# Patient Record
Sex: Female | Born: 1985 | Race: White | Hispanic: No | Marital: Single | Smoking: Current every day smoker
Health system: Southern US, Community
[De-identification: ages and names within clinical notes are randomized; demographics above are authoritative.]

## PROBLEM LIST (undated history)

## (undated) ENCOUNTER — Inpatient Hospital Stay (HOSPITAL_COMMUNITY): Payer: Self-pay

## (undated) DIAGNOSIS — F1110 Opioid abuse, uncomplicated: Secondary | ICD-10-CM

## (undated) DIAGNOSIS — K219 Gastro-esophageal reflux disease without esophagitis: Secondary | ICD-10-CM

## (undated) DIAGNOSIS — M545 Low back pain, unspecified: Secondary | ICD-10-CM

## (undated) DIAGNOSIS — R569 Unspecified convulsions: Secondary | ICD-10-CM

## (undated) DIAGNOSIS — F419 Anxiety disorder, unspecified: Secondary | ICD-10-CM

## (undated) DIAGNOSIS — F1920 Other psychoactive substance dependence, uncomplicated: Secondary | ICD-10-CM

## (undated) DIAGNOSIS — G8929 Other chronic pain: Secondary | ICD-10-CM

## (undated) DIAGNOSIS — N12 Tubulo-interstitial nephritis, not specified as acute or chronic: Secondary | ICD-10-CM

## (undated) DIAGNOSIS — Z8719 Personal history of other diseases of the digestive system: Secondary | ICD-10-CM

## (undated) DIAGNOSIS — M549 Dorsalgia, unspecified: Secondary | ICD-10-CM

## (undated) DIAGNOSIS — E039 Hypothyroidism, unspecified: Secondary | ICD-10-CM

## (undated) HISTORY — PX: MULTIPLE TOOTH EXTRACTIONS: SHX2053

## (undated) HISTORY — PX: URETERAL STENT PLACEMENT: SHX822

## (undated) HISTORY — PX: DILATION AND CURETTAGE OF UTERUS: SHX78

## (undated) HISTORY — PX: CHOLECYSTECTOMY: SHX55

---

## 2008-04-22 ENCOUNTER — Ambulatory Visit (HOSPITAL_COMMUNITY): Admission: RE | Admit: 2008-04-22 | Discharge: 2008-04-22 | Payer: Self-pay | Admitting: Psychiatry

## 2008-05-12 ENCOUNTER — Encounter: Payer: Self-pay | Admitting: Obstetrics and Gynecology

## 2008-05-12 ENCOUNTER — Ambulatory Visit: Payer: Self-pay | Admitting: Obstetrics & Gynecology

## 2008-06-02 ENCOUNTER — Ambulatory Visit: Payer: Self-pay | Admitting: Obstetrics & Gynecology

## 2008-06-30 ENCOUNTER — Ambulatory Visit: Payer: Self-pay | Admitting: Obstetrics & Gynecology

## 2008-07-07 ENCOUNTER — Ambulatory Visit (HOSPITAL_COMMUNITY): Admission: RE | Admit: 2008-07-07 | Discharge: 2008-07-07 | Payer: Self-pay | Admitting: Family Medicine

## 2008-07-28 ENCOUNTER — Ambulatory Visit: Payer: Self-pay | Admitting: Obstetrics & Gynecology

## 2008-07-28 ENCOUNTER — Encounter: Payer: Self-pay | Admitting: Family

## 2008-07-28 LAB — CONVERTED CEMR LAB
HCT: 34.2 % — ABNORMAL LOW (ref 36.0–46.0)
Hemoglobin: 11.2 g/dL — ABNORMAL LOW (ref 12.0–15.0)
MCHC: 32.7 g/dL (ref 30.0–36.0)
MCV: 87.9 fL (ref 78.0–100.0)
Platelets: 164 10*3/uL (ref 150–400)
RBC: 3.89 M/uL (ref 3.87–5.11)
RDW: 15.1 % (ref 11.5–15.5)
WBC: 11.2 10*3/uL — ABNORMAL HIGH (ref 4.0–10.5)

## 2008-08-31 ENCOUNTER — Inpatient Hospital Stay (HOSPITAL_COMMUNITY): Admission: AD | Admit: 2008-08-31 | Discharge: 2008-08-31 | Payer: Self-pay | Admitting: Family Medicine

## 2008-08-31 ENCOUNTER — Ambulatory Visit: Payer: Self-pay | Admitting: Family Medicine

## 2008-09-08 ENCOUNTER — Ambulatory Visit: Payer: Self-pay | Admitting: Obstetrics & Gynecology

## 2008-09-22 ENCOUNTER — Ambulatory Visit: Payer: Self-pay | Admitting: Obstetrics & Gynecology

## 2008-09-27 ENCOUNTER — Inpatient Hospital Stay (HOSPITAL_COMMUNITY): Admission: AD | Admit: 2008-09-27 | Discharge: 2008-09-27 | Payer: Self-pay | Admitting: Obstetrics & Gynecology

## 2008-09-29 ENCOUNTER — Ambulatory Visit: Payer: Self-pay | Admitting: Obstetrics & Gynecology

## 2008-09-29 ENCOUNTER — Ambulatory Visit (HOSPITAL_COMMUNITY): Admission: RE | Admit: 2008-09-29 | Discharge: 2008-09-29 | Payer: Self-pay | Admitting: Family Medicine

## 2008-10-08 ENCOUNTER — Ambulatory Visit: Payer: Self-pay | Admitting: Family Medicine

## 2008-10-08 ENCOUNTER — Inpatient Hospital Stay (HOSPITAL_COMMUNITY): Admission: AD | Admit: 2008-10-08 | Discharge: 2008-10-10 | Payer: Self-pay | Admitting: Family Medicine

## 2010-08-27 ENCOUNTER — Encounter: Payer: Self-pay | Admitting: *Deleted

## 2010-11-16 LAB — CBC
MCHC: 33.6 g/dL (ref 30.0–36.0)
Platelets: 253 10*3/uL (ref 150–400)
RDW: 16.5 % — ABNORMAL HIGH (ref 11.5–15.5)

## 2010-11-20 LAB — GC/CHLAMYDIA PROBE AMP, GENITAL
Chlamydia, DNA Probe: NEGATIVE
GC Probe Amp, Genital: NEGATIVE

## 2010-11-20 LAB — RAPID URINE DRUG SCREEN, HOSP PERFORMED
Amphetamines: NOT DETECTED
Barbiturates: NOT DETECTED
Benzodiazepines: NOT DETECTED

## 2010-11-20 LAB — URINALYSIS, ROUTINE W REFLEX MICROSCOPIC
Glucose, UA: NEGATIVE mg/dL
Ketones, ur: NEGATIVE mg/dL
Protein, ur: NEGATIVE mg/dL
Urobilinogen, UA: 0.2 mg/dL (ref 0.0–1.0)

## 2010-11-20 LAB — WET PREP, GENITAL: Yeast Wet Prep HPF POC: NONE SEEN

## 2010-11-20 LAB — STREP B DNA PROBE: Strep Group B Ag: NEGATIVE

## 2010-11-21 LAB — POCT URINALYSIS DIP (DEVICE)
Bilirubin Urine: NEGATIVE
Bilirubin Urine: NEGATIVE
Glucose, UA: NEGATIVE mg/dL
Glucose, UA: NEGATIVE mg/dL
Hgb urine dipstick: NEGATIVE
Hgb urine dipstick: NEGATIVE
Ketones, ur: NEGATIVE mg/dL
Ketones, ur: NEGATIVE mg/dL
Ketones, ur: NEGATIVE mg/dL
Protein, ur: NEGATIVE mg/dL
Specific Gravity, Urine: 1.015 (ref 1.005–1.030)
Specific Gravity, Urine: 1.015 (ref 1.005–1.030)
Specific Gravity, Urine: 1.015 (ref 1.005–1.030)
pH: 6 (ref 5.0–8.0)

## 2010-12-19 NOTE — Discharge Summary (Signed)
Tammy Winters, Tammy Winters               ACCOUNT NO.:  [id number]   MEDICAL RECORD NO.:  [id number]          PATIENT TYPE:  INP   LOCATION:  9112                          FACILITY:  [facility]   PHYSICIAN:  Tanya S. Shawnie Pons, M.D.   DATE OF BIRTH:  1986-07-21   DATE OF ADMISSION:  10/08/2008  DATE OF DISCHARGE:  10/10/2008                               DISCHARGE SUMMARY   ADMITTING DIAGNOSES:  1. Intrauterine pregnancy at 46 and 4/7 weeks.  2. Early active labor.  3. Fetal bradycardia.   PROCEDURE:  Vacuum-assisted vaginal delivery.   HOSPITAL COURSE:  [name] is a 25 year old gravida 2, para 1-0-0-1,  who presented at 68 and 4/7 weeks' in early active labor.  She was 4 cm,  80%, vertex -2.  She did an epidural when she got to labor and delivery.  Her prenatal care has been followed by [facility] with  onset of care at 17 weeks' gestation.  She uses methadone.  She quickly  labored to 9.5 cm and +2 station.  At that point, she was having some  early decelerations.  Upon pushing, she had some fetal bradycardia to  the 50s.  At that point, she was consented for a vacuum delivery by Dr.  Odie Sera that resulted in a birth of viable female infant, weight of  5 pounds 13 ounces, Apgars of 8 and 9.  He was taken to the full-term  nursery in good condition.  The patient tolerated her media postpartum  recovery well and was taken to the mother-baby unit.  By postpartum day  #1, she was doing well.  Her vital signs were stable.  It was discussed  with her that her methadone dose be reduced to 90 mg that she was on 105  mg at the end of pregnancy, but had been on 30 mg at the onset of  pregnancy.  The patient reluctantly agreed and she was bottle feeding.  By postpartum day #2, she continued to do well.  She had signed  bilateral tubal ligation consent papers.  During her stay, she also  expressed a desire for NuvaRing for more immediate contraception.  She  was deemed to have received  the full benefit of her hospital stay.  By  postpartum day #2, she was discharged home.   DISCHARGE MEDICATIONS:  1. Motrin 600 mg one p.o. q.6 h. p.r.n. pain.  2. NuvaRing to use as directed, to start it approximately 2 weeks, and      then methadone dosing will be per  [facility]  at [facility] in the      morning of October 11, 2008.       Cam Hai, C.N.M.      Shelbie Proctor. Shawnie Pons, M.D.  Electronically Signed    KS/MEDQ  D:  10/10/2008  T:  10/10/2008  Job:  [id number]

## 2010-12-19 NOTE — Op Note (Signed)
Tammy Winters, Tammy Winters               ACCOUNT NO.:  [id number]   MEDICAL RECORD NO.:  [id number]          PATIENT TYPE:  INP   LOCATION:  9160                          FACILITY:  [facility]   PHYSICIAN:  Tanya S. Shawnie Pons, M.D.   DATE OF BIRTH:  [birth year]   DATE OF PROCEDURE:  10/08/2008  DATE OF DISCHARGE:                               OPERATIVE REPORT   DELIVERY NOTE:  Tammy Winters' labor progressed normally and when  she was complete/complete and +2, she began her pushing efforts.  With  the first set of pushing efforts, the baby moved to a +3 station.  However, the baby suffered a fetal bradycardia down to the 50s.  This  did not improve with scalp stimulation and the patient's pushing efforts  then diminished due to pain and nausea.  The patient was counseled on  use of a vacuum.  Her bladder was not emptied because a Foley catheter  had been removed approximately 10 minutes before.  Anesthesia was  appropriate with an epidural.  The vacuum was placed in the appropriate  position on fetal head just anterior to the posterior fontanelle.  With  the assistance of some pushing efforts, the head was delivered easily on  the first pull with the vacuum.  There were no pop offs.  The head  presented LOA.  The mouth and nares were bulb suctioned.  A loose nuchal  cord was noted and reduced manually and the anterior shoulder followed  by the rest of the corpus were delivered in the usual manner without  difficulty.  The baby had a spontaneous cry, good color and tone.  The  mouth and nares were bulb suctioned again.  The cord was then clamped  and cut and baby was handed to the awaiting delivery room nurse.  The  placenta then delivered spontaneously and intact.  There was a three-  vessel cord.  A second-degree laceration was repaired in the usual  manner with 3-0 Vicryl.  A left periurethral tear was also repaired with  two simple interrupted sutures of the same suture material.  The lower  uterine segment was then checked and blood clots were removed.  The  uterus was noted to be firm.  Pitocin was running in the usual manner.  Estimated blood loss is 450 mL.  The patient tolerated procedure well  and there are no complications.      Odie Sera, DO  Electronically Signed     ______________________________  Shelbie Proctor. Shawnie Pons, M.D.    MC/MEDQ  D:  10/08/2008  T:  10/08/2008  Job:  [id number]

## 2011-05-08 LAB — POCT URINALYSIS DIP (DEVICE)
Bilirubin Urine: NEGATIVE
Glucose, UA: NEGATIVE
Glucose, UA: NEGATIVE
Hgb urine dipstick: NEGATIVE
Hgb urine dipstick: NEGATIVE
Ketones, ur: 15 — AB
Ketones, ur: NEGATIVE
Nitrite: NEGATIVE
Nitrite: NEGATIVE
Specific Gravity, Urine: 1.025
Specific Gravity, Urine: >=1.03
Specific Gravity, Urine: 1.01
pH: 5.5
pH: 7

## 2011-05-11 LAB — POCT URINALYSIS DIP (DEVICE)
Glucose, UA: NEGATIVE mg/dL
Hgb urine dipstick: NEGATIVE
Ketones, ur: NEGATIVE mg/dL
Protein, ur: 30 mg/dL — AB
Specific Gravity, Urine: 1.015 (ref 1.005–1.030)

## 2012-11-16 ENCOUNTER — Encounter (HOSPITAL_COMMUNITY): Payer: Self-pay | Admitting: Emergency Medicine

## 2012-11-16 ENCOUNTER — Inpatient Hospital Stay (HOSPITAL_COMMUNITY)

## 2012-11-16 ENCOUNTER — Emergency Department (HOSPITAL_COMMUNITY)

## 2012-11-16 ENCOUNTER — Inpatient Hospital Stay (HOSPITAL_COMMUNITY)
Admission: EM | Admit: 2012-11-16 | Discharge: 2012-11-18 | DRG: 896 | Disposition: A | Discharge status: Home / Self Care | Attending: Internal Medicine | Admitting: Internal Medicine

## 2012-11-16 DIAGNOSIS — F1193 Opioid use, unspecified with withdrawal: Secondary | ICD-10-CM | POA: Diagnosis present

## 2012-11-16 DIAGNOSIS — F19939 Other psychoactive substance use, unspecified with withdrawal, unspecified: Secondary | ICD-10-CM

## 2012-11-16 DIAGNOSIS — J9600 Acute respiratory failure, unspecified whether with hypoxia or hypercapnia: Secondary | ICD-10-CM

## 2012-11-16 DIAGNOSIS — F411 Generalized anxiety disorder: Secondary | ICD-10-CM | POA: Diagnosis present

## 2012-11-16 DIAGNOSIS — J9601 Acute respiratory failure with hypoxia: Secondary | ICD-10-CM | POA: Diagnosis present

## 2012-11-16 DIAGNOSIS — Z79899 Other long term (current) drug therapy: Secondary | ICD-10-CM

## 2012-11-16 DIAGNOSIS — R569 Unspecified convulsions: Secondary | ICD-10-CM | POA: Diagnosis present

## 2012-11-16 DIAGNOSIS — F1120 Opioid dependence, uncomplicated: Secondary | ICD-10-CM | POA: Diagnosis present

## 2012-11-16 DIAGNOSIS — G40401 Other generalized epilepsy and epileptic syndromes, not intractable, with status epilepticus: Secondary | ICD-10-CM

## 2012-11-16 DIAGNOSIS — L299 Pruritus, unspecified: Secondary | ICD-10-CM | POA: Diagnosis not present

## 2012-11-16 DIAGNOSIS — F1910 Other psychoactive substance abuse, uncomplicated: Secondary | ICD-10-CM | POA: Diagnosis present

## 2012-11-16 DIAGNOSIS — A498 Other bacterial infections of unspecified site: Secondary | ICD-10-CM | POA: Diagnosis present

## 2012-11-16 DIAGNOSIS — N390 Urinary tract infection, site not specified: Secondary | ICD-10-CM | POA: Diagnosis present

## 2012-11-16 DIAGNOSIS — G40901 Epilepsy, unspecified, not intractable, with status epilepticus: Secondary | ICD-10-CM

## 2012-11-16 DIAGNOSIS — T370X5A Adverse effect of sulfonamides, initial encounter: Secondary | ICD-10-CM | POA: Diagnosis not present

## 2012-11-16 DIAGNOSIS — E873 Alkalosis: Secondary | ICD-10-CM | POA: Diagnosis present

## 2012-11-16 DIAGNOSIS — F1123 Opioid dependence with withdrawal: Secondary | ICD-10-CM | POA: Diagnosis present

## 2012-11-16 HISTORY — DX: Opioid abuse, uncomplicated: F11.10

## 2012-11-16 HISTORY — DX: Anxiety disorder, unspecified: F41.9

## 2012-11-16 HISTORY — DX: Other psychoactive substance dependence, uncomplicated: F19.20

## 2012-11-16 LAB — RAPID URINE DRUG SCREEN, HOSP PERFORMED
Amphetamines: NOT DETECTED
Barbiturates: NOT DETECTED
Benzodiazepines: POSITIVE — AB
Cocaine: NOT DETECTED
Tetrahydrocannabinol: POSITIVE — AB

## 2012-11-16 LAB — POCT I-STAT, CHEM 8
Calcium, Ion: 1.17 mmol/L (ref 1.12–1.23)
HCT: 41 % (ref 36.0–46.0)
TCO2: 25 mmol/L (ref 0–100)

## 2012-11-16 LAB — COMPREHENSIVE METABOLIC PANEL
ALT: 49 U/L — ABNORMAL HIGH (ref 0–35)
Albumin: 3.7 g/dL (ref 3.5–5.2)
Alkaline Phosphatase: 63 U/L (ref 39–117)
BUN: 10 mg/dL (ref 6–23)
Potassium: 3.3 mEq/L — ABNORMAL LOW (ref 3.5–5.1)
Sodium: 142 mEq/L (ref 135–145)
Total Protein: 7 g/dL (ref 6.0–8.3)

## 2012-11-16 LAB — CBC
HCT: 39 % (ref 36.0–46.0)
MCV: 87.8 fL (ref 78.0–100.0)
RBC: 4.44 MIL/uL (ref 3.87–5.11)
WBC: 12.5 10*3/uL — ABNORMAL HIGH (ref 4.0–10.5)

## 2012-11-16 LAB — POCT I-STAT 3, ART BLOOD GAS (G3+)
TCO2: 30 mmol/L (ref 0–100)
pCO2 arterial: 35.1 mmHg (ref 35.0–45.0)
pH, Arterial: 7.517 — ABNORMAL HIGH (ref 7.350–7.450)

## 2012-11-16 LAB — POCT PREGNANCY, URINE: Preg Test, Ur: NEGATIVE

## 2012-11-16 LAB — SALICYLATE LEVEL: Salicylate Lvl: <2 mg/dL — ABNORMAL LOW (ref 2.8–20.0)

## 2012-11-16 LAB — MRSA PCR SCREENING: MRSA by PCR: NEGATIVE

## 2012-11-16 LAB — URINALYSIS, ROUTINE W REFLEX MICROSCOPIC
Bilirubin Urine: NEGATIVE
Hgb urine dipstick: NEGATIVE
Specific Gravity, Urine: 1.025 (ref 1.005–1.030)
pH: 6 (ref 5.0–8.0)

## 2012-11-16 LAB — ETHANOL: Alcohol, Ethyl (B): <11 mg/dL (ref 0–11)

## 2012-11-16 LAB — URINE MICROSCOPIC-ADD ON

## 2012-11-16 MED ORDER — LORAZEPAM 2 MG/ML IJ SOLN
INTRAMUSCULAR | Status: AC
  Administered 2012-11-16: 2 mg
  Filled 2012-11-16: qty 1

## 2012-11-16 MED ORDER — HEPARIN SODIUM (PORCINE) 5000 UNIT/ML IJ SOLN
5000.0000 [IU] | Freq: Three times a day (TID) | INTRAMUSCULAR | Status: DC
  Administered 2012-11-16 – 2012-11-17 (×4): 5000 [IU] via SUBCUTANEOUS
  Filled 2012-11-16 (×10): qty 1

## 2012-11-16 MED ORDER — DEXTROSE 5 % IV SOLN
1.0000 g | Freq: Once | INTRAVENOUS | Status: DC
  Administered 2012-11-16: 1 g via INTRAVENOUS
  Filled 2012-11-16: qty 10

## 2012-11-16 MED ORDER — CHLORHEXIDINE GLUCONATE 0.12 % MT SOLN
15.0000 mL | Freq: Two times a day (BID) | OROMUCOSAL | Status: DC
  Administered 2012-11-16: 15 mL via OROMUCOSAL
  Filled 2012-11-16: qty 15

## 2012-11-16 MED ORDER — SODIUM CHLORIDE 0.9 % IV SOLN: 250.0000 mL | INTRAVENOUS | Status: DC | PRN

## 2012-11-16 MED ORDER — LORAZEPAM 2 MG/ML IJ SOLN
INTRAMUSCULAR | Status: AC
  Filled 2012-11-16: qty 1

## 2012-11-16 MED ORDER — ROCURONIUM BROMIDE 50 MG/5ML IV SOLN
INTRAVENOUS | Status: AC
  Filled 2012-11-16: qty 2

## 2012-11-16 MED ORDER — ONDANSETRON HCL 4 MG/2ML IJ SOLN
INTRAMUSCULAR | Status: AC
  Administered 2012-11-16: 4 mg
  Filled 2012-11-16: qty 2

## 2012-11-16 MED ORDER — SUCCINYLCHOLINE CHLORIDE 20 MG/ML IJ SOLN
INTRAMUSCULAR | Status: AC
  Filled 2012-11-16: qty 1

## 2012-11-16 MED ORDER — SODIUM CHLORIDE 0.9 % IV BOLUS (SEPSIS)
1000.0000 mL | Freq: Once | INTRAVENOUS | Status: AC
  Administered 2012-11-16: 1000 mL via INTRAVENOUS

## 2012-11-16 MED ORDER — SODIUM CHLORIDE 0.9 % IV BOLUS (SEPSIS)
500.0000 mL | Freq: Once | INTRAVENOUS | Status: AC
  Administered 2012-11-16: 500 mL via INTRAVENOUS

## 2012-11-16 MED ORDER — PROPOFOL 10 MG/ML IV EMUL
INTRAVENOUS | Status: AC
  Filled 2012-11-16: qty 100

## 2012-11-16 MED ORDER — SODIUM CHLORIDE 0.9 % IV SOLN
500.0000 mg | Freq: Two times a day (BID) | INTRAVENOUS | Status: DC
  Filled 2012-11-16 (×2): qty 5

## 2012-11-16 MED ORDER — ETOMIDATE 2 MG/ML IV SOLN
INTRAVENOUS | Status: AC
  Filled 2012-11-16: qty 20

## 2012-11-16 MED ORDER — LIDOCAINE HCL (CARDIAC) 20 MG/ML IV SOLN
INTRAVENOUS | Status: AC
  Filled 2012-11-16: qty 5

## 2012-11-16 MED ORDER — DEXTROSE 5 % IV SOLN
1.0000 g | INTRAVENOUS | Status: DC
  Administered 2012-11-17 – 2012-11-18 (×2): 1 g via INTRAVENOUS
  Filled 2012-11-16 (×3): qty 10

## 2012-11-16 MED ORDER — LIDOCAINE HCL (CARDIAC) 20 MG/ML IV SOLN
INTRAVENOUS | Status: DC | PRN
  Administered 2012-11-16: 100 mg via INTRAVENOUS

## 2012-11-16 MED ORDER — SODIUM CHLORIDE 0.9 % IV SOLN
INTRAVENOUS | Status: DC
  Administered 2012-11-16 (×3): via INTRAVENOUS

## 2012-11-16 MED ORDER — FENTANYL CITRATE 0.05 MG/ML IJ SOLN
50.0000 ug | INTRAMUSCULAR | Status: DC | PRN
  Filled 2012-11-16: qty 2

## 2012-11-16 MED ORDER — PROPOFOL 10 MG/ML IV EMUL: 5.0000 ug/kg/min | INTRAVENOUS | Status: DC

## 2012-11-16 MED ORDER — LORAZEPAM 2 MG/ML IJ SOLN
1.0000 mg | Freq: Once | INTRAMUSCULAR | Status: AC
  Administered 2012-11-16: 1 mg via INTRAVENOUS

## 2012-11-16 MED ORDER — SODIUM CHLORIDE 0.9 % IV SOLN
1000.0000 mg | Freq: Once | INTRAVENOUS | Status: AC
  Administered 2012-11-16: 1000 mg via INTRAVENOUS
  Filled 2012-11-16: qty 10

## 2012-11-16 MED ORDER — WHITE PETROLATUM GEL
Status: AC
  Administered 2012-11-16: 0.2
  Filled 2012-11-16: qty 5

## 2012-11-16 MED ORDER — METHADONE HCL 10 MG PO TABS
20.0000 mg | ORAL_TABLET | Freq: Two times a day (BID) | ORAL | Status: DC
  Administered 2012-11-16 – 2012-11-18 (×5): 20 mg via ORAL
  Filled 2012-11-16 (×4): qty 2
  Filled 2012-11-16: qty 1

## 2012-11-16 MED ORDER — ETOMIDATE 2 MG/ML IV SOLN
INTRAVENOUS | Status: DC | PRN
  Administered 2012-11-16: 20 mg via INTRAVENOUS

## 2012-11-16 MED ORDER — FENTANYL CITRATE 0.05 MG/ML IJ SOLN
50.0000 ug | Freq: Once | INTRAMUSCULAR | Status: AC
  Administered 2012-11-16: 50 ug via INTRAVENOUS

## 2012-11-16 MED ORDER — PANTOPRAZOLE SODIUM 40 MG IV SOLR: 40.0000 mg | INTRAVENOUS | Status: DC

## 2012-11-16 MED ORDER — SUCCINYLCHOLINE CHLORIDE 20 MG/ML IJ SOLN
INTRAMUSCULAR | Status: DC | PRN
  Administered 2012-11-16: 100 mg via INTRAVENOUS

## 2012-11-16 MED ORDER — SODIUM CHLORIDE 0.9 % IV SOLN
500.0000 mg | Freq: Two times a day (BID) | INTRAVENOUS | Status: DC
  Administered 2012-11-16 (×2): 500 mg via INTRAVENOUS
  Filled 2012-11-16 (×3): qty 5

## 2012-11-16 MED ORDER — THIAMINE HCL 100 MG/ML IJ SOLN
Freq: Once | INTRAVENOUS | Status: DC
  Filled 2012-11-16: qty 1000

## 2012-11-16 MED ORDER — FENTANYL BOLUS VIA INFUSION
25.0000 ug | Freq: Four times a day (QID) | INTRAVENOUS | Status: DC | PRN
  Filled 2012-11-16: qty 100

## 2012-11-16 MED ORDER — POTASSIUM CHLORIDE CRYS ER 20 MEQ PO TBCR
40.0000 meq | EXTENDED_RELEASE_TABLET | Freq: Once | ORAL | Status: AC
  Administered 2012-11-16: 40 meq via ORAL
  Filled 2012-11-16: qty 2

## 2012-11-16 MED ORDER — ONDANSETRON HCL 4 MG/2ML IJ SOLN: 4.0000 mg | Freq: Three times a day (TID) | INTRAMUSCULAR | Status: DC | PRN

## 2012-11-16 MED ORDER — SODIUM CHLORIDE 0.9 % IV SOLN
25.0000 ug/h | INTRAVENOUS | Status: DC
  Administered 2012-11-16: 50 ug/h via INTRAVENOUS
  Filled 2012-11-16: qty 50

## 2012-11-16 MED ORDER — BIOTENE DRY MOUTH MT LIQD: 15.0000 mL | Freq: Four times a day (QID) | OROMUCOSAL | Status: DC

## 2012-11-16 MED ORDER — ALBUTEROL SULFATE HFA 108 (90 BASE) MCG/ACT IN AERS
4.0000 | INHALATION_SPRAY | RESPIRATORY_TRACT | Status: DC | PRN
  Filled 2012-11-16: qty 6.7

## 2012-11-16 MED ORDER — CHLORHEXIDINE GLUCONATE 0.12 % MT SOLN
OROMUCOSAL | Status: AC
  Administered 2012-11-16: 15 mL
  Filled 2012-11-16: qty 15

## 2012-11-16 NOTE — ED Notes (Signed)
Pt restrained by handcuffs.

## 2012-11-16 NOTE — ED Notes (Signed)
Pt is also complaining of leg pain and stomach pain.

## 2012-11-16 NOTE — ED Provider Notes (Signed)
History     CSN: [id number]  Arrival date & time 11/16/12  0011   First MD Initiated Contact with Patient 11/16/12 [phone]      Chief Complaint  Patient presents with  . Seizures    (Consider location/radiation/quality/duration/timing/severity/associated sxs/prior treatment) HPI History provided by patient, EMS and law enforcement bedside. Patient has been going to the methadone clinic for the last 4 years, and his last week has not had any methadone after being arrested. She is currently in custody and had multiple witnessed seizures while in jail tonight. EMS provided Versed and since that time has not had any more seizure activity. Patient denies history of same. She is somewhat post ictal at time of my evaluation but denies any pain or injury at this time. No  tongue laceration. No incontinence. Symptoms moderate in severity.   History reviewed. No pertinent past medical history.  History reviewed. No pertinent past surgical history.  History reviewed. No pertinent family history.  History  Substance Use Topics  . Smoking status: Not on file  . Smokeless tobacco: Not on file  . Alcohol Use: Not on file    OB History   Grav Para Term Preterm Abortions TAB SAB Ect Mult Living                  Review of Systems  Constitutional: Negative for fever and chills.  HENT: Negative for neck pain and neck stiffness.   Eyes: Negative for pain.  Respiratory: Negative for shortness of breath.   Cardiovascular: Negative for chest pain.  Gastrointestinal: Negative for vomiting and abdominal pain.  Genitourinary: Negative for dysuria.  Musculoskeletal: Negative for back pain.  Skin: Negative for rash.  Neurological: Positive for seizures.  All other systems reviewed and are negative.    Allergies  Review of patient's allergies indicates not on file.  Home Medications  No current outpatient prescriptions on file.  BP 102/87  Pulse 90  Temp(Src) 97.7 F (36.5 C) (Oral)   Resp 15  SpO2 98%  Physical Exam  Constitutional: She appears well-developed and well-nourished.  HENT:  Head: Normocephalic and atraumatic.  No tongue laceration. No dental tenderness  Eyes: EOM are normal. Pupils are equal, round, and reactive to light.  Neck: Neck supple.  No midline cervical tenderness or deformity  Cardiovascular: Normal rate, regular rhythm and intact distal pulses.   Pulmonary/Chest: Effort normal and breath sounds normal. No respiratory distress. She exhibits no tenderness.  Abdominal: Soft. Bowel sounds are normal. She exhibits no distension.  Musculoskeletal: Normal range of motion. She exhibits no edema and no tenderness.  Moves all extremities x4 without any point tenderness or deficits. Mild abrasions right ankle  Neurological: She is alert.  Eyes closed with speech slow, no unilateral deficits, post ictal  Skin: Skin is warm and dry.    ED Course  INTUBATION Date/Time: 11/16/2012 2:19 AM Performed by: Sunnie Nielsen Authorized by: Sunnie Nielsen Consent: The procedure was performed in an emergent situation. Required items: required blood products, implants, devices, and special equipment available Patient identity confirmed: arm band Time out: Immediately prior to procedure a "time out" was called to verify the correct patient, procedure, equipment, support staff and site/side marked as required. Indications: airway protection Intubation method: direct Pretreatment medications: lidocaine Sedatives: etomidate Paralytic: succinylcholine Laryngoscope size: Mac 4 Tube size: 7.5 mm Tube type: cuffed Number of attempts: 1 Cricoid pressure: yes Cords visualized: yes Post-procedure assessment: chest rise and CO2 detector Breath sounds: equal and absent over the  epigastrium Cuff inflated: yes ETT to lip: 24 cm Tube secured with: ETT holder Chest x-ray interpreted by me.   (including critical care time)   Results for orders placed during the hospital  encounter of 11/16/12  CBC      Result Value Range   WBC 12.5 (*) 4.0 - 10.5 K/uL   RBC 4.44  3.87 - 5.11 MIL/uL   Hemoglobin 13.5  12.0 - 15.0 g/dL   HCT 09.8  11.9 - 14.7 %   MCV 87.8  78.0 - 100.0 fL   MCH 30.4  26.0 - 34.0 pg   MCHC 34.6  30.0 - 36.0 g/dL   RDW 82.9  56.2 - 13.0 %   Platelets 248  150 - 400 K/uL  URINALYSIS, ROUTINE W REFLEX MICROSCOPIC      Result Value Range   Color, Urine YELLOW  YELLOW   APPearance CLOUDY (*) CLEAR   Specific Gravity, Urine 1.025  1.005 - 1.030   pH 6.0  5.0 - 8.0   Glucose, UA NEGATIVE  NEGATIVE mg/dL   Hgb urine dipstick NEGATIVE  NEGATIVE   Bilirubin Urine NEGATIVE  NEGATIVE   Ketones, ur NEGATIVE  NEGATIVE mg/dL   Protein, ur NEGATIVE  NEGATIVE mg/dL   Urobilinogen, UA 0.2  0.0 - 1.0 mg/dL   Nitrite POSITIVE (*) NEGATIVE   Leukocytes, UA SMALL (*) NEGATIVE  URINE RAPID DRUG SCREEN (HOSP PERFORMED)      Result Value Range   Opiates NONE DETECTED  NONE DETECTED   Cocaine NONE DETECTED  NONE DETECTED   Benzodiazepines POSITIVE (*) NONE DETECTED   Amphetamines NONE DETECTED  NONE DETECTED   Tetrahydrocannabinol POSITIVE (*) NONE DETECTED   Barbiturates NONE DETECTED  NONE DETECTED  URINE MICROSCOPIC-ADD ON      Result Value Range   Squamous Epithelial / LPF RARE  RARE   WBC, UA 3-6  <3 WBC/hpf   RBC / HPF 0-2  <3 RBC/hpf   Bacteria, UA MANY (*) RARE   Crystals CA OXALATE CRYSTALS (*) NEGATIVE  POCT I-STAT, CHEM 8      Result Value Range   Sodium 143  135 - 145 mEq/L   Potassium 3.4 (*) 3.5 - 5.1 mEq/L   Chloride 105  96 - 112 mEq/L   BUN 9  6 - 23 mg/dL   Creatinine, Ser 8.65  0.50 - 1.10 mg/dL   Glucose, Bld 784 (*) 70 - 99 mg/dL   Calcium, Ion 6.96  2.95 - 1.23 mmol/L   TCO2 25  0 - 100 mmol/L   Hemoglobin 13.9  12.0 - 15.0 g/dL   HCT 28.4  13.2 - 44.0 %  POCT PREGNANCY, URINE      Result Value Range   Preg Test, Ur NEGATIVE  NEGATIVE   CRITICAL CARE Performed by: Sunnie Nielsen   Total critical care time:  35  Critical care time was exclusive of separately billable procedures and treating other patients.  Critical care was necessary to treat or prevent imminent or life-threatening deterioration.  Critical care was time spent personally by me on the following activities: development of treatment plan with patient and/or surrogate as well as nursing, discussions with consultants, evaluation of patient's response to treatment, examination of patient, obtaining history from patient or surrogate, ordering and performing treatments and interventions, ordering and review of laboratory studies, ordering and review of radiographic studies, pulse oximetry and re-evaluation of patient's condition.    Date: 11/16/2012  Rate: 95  Rhythm: normal sinus rhythm  QRS Axis: normal  Intervals: PR shortened  ST/T Wave abnormalities: nonspecific ST/T changes  Conduction Disutrbances:none  Narrative Interpretation:   Old EKG Reviewed: none available  1:25 AM IV ativan for recurrent seizures in the ED. Consult NEU - Dr Tomma Rakers Keppra now and if needs intubation IV Propofol.   2:03 AM - persistent Sz activity. Set up for intubation and plan ICU admit on propofol. IV ABx for possible UTI  2:20 AM discussed with pulmonary critical care on call and plan admit to ICU. CXR and CT pending  2:41 AM discussed again with the neurologist, will follow.   MDM  Status epilepticus Multiple witnessed seizures Seizure in the ED after versed and Ativan. IV Keppra Intubated for airway protection and propofol drip IV antibiotics for possible UTI culture pending UDS shows THC and benzos, EKG and labs reviewed Imaging obtained ICU admit     Sunnie Nielsen, MD 11/16/12 [phone]

## 2012-11-16 NOTE — ED Notes (Signed)
Pt seizing, per MD opitz, give 1mg  ativan

## 2012-11-16 NOTE — Progress Notes (Signed)
Dr Vassie Loll notified of pt dry heaves and nausea, order was given for zofran IVP. Dr Vassie Loll also made aware pt stated she was "swimmy headed feeling like I felt before my seizures in jail yesterday" Dr Vassie Loll informed no PRN's ordered incase of seizure. He stated to make CCM aware if pt began to seizure.  Pt then began at approx 2:45pm having extremity twitching and she stated reglan does the same and does not want anymore zofran. Allergies updated in chart and zofran discontinued.  Will continue to monitor.

## 2012-11-16 NOTE — ED Notes (Signed)
Dr. Dierdre Highman at bedside after first seizure; Dr. Dierdre Highman makes decision to intubate pt. B/c of new onset. Prior to intubation, pt. Had 3 seizure activity events (lasting 6-10 seconds), and were similar to the other seizures during ED visit.

## 2012-11-16 NOTE — ED Notes (Signed)
Patient transported to CT.  Pt accompanied by respiratory therapist, nurse,nurse tech, x2 sheriffs.

## 2012-11-16 NOTE — ED Notes (Signed)
Per EMS, Pt has been going to methadone clinic, but has not been going the past week due to pt being arrested. Pt has law enforcement with her because pt was arrested for failure to appear in court. Pt started seizing tonight and does not have a hx of seizures. Pt received midazolam 5mg  via ems.

## 2012-11-16 NOTE — Progress Notes (Signed)
Subjective: Patient without further seizures since admission.  On Keppra.  Awake and alert.  No side effects noted to Keppra.    Objective: Current vital signs: BP 108/60  Pulse 85  Temp(Src) 97.9 F (36.6 C) (Core (Comment))  Resp 17  Ht 5\' 2"  (1.575 m)  Wt 66.1 kg (145 lb 11.6 oz)  BMI 26.65 kg/m2  SpO2 100%  LMP 11/16/2012 Vital signs in last 24 hours: Temp:  [95.9 F (35.5 C)-97.9 F (36.6 C)] 97.9 F (36.6 C) (04/13 0715) Pulse Rate:  [56-124] 85 (04/13 0715) Resp:  [11-31] 17 (04/13 0715) BP: (80-139)/(53-124) 108/60 mmHg (04/13 0715) SpO2:  [98 %-100 %] 100 % (04/13 0700) FiO2 (%):  [30.1 %-100 %] 30.1 % (04/13 0729) Weight:  [66.1 kg (145 lb 11.6 oz)-68.04 kg (150 lb)] 66.1 kg (145 lb 11.6 oz) (04/13 0530)  Intake/Output from previous day: 04/12 0701 - 04/13 0700 In: 1204.9 [I.V.:704.9; IV Piggyback:500] Out: 250 [Urine:250] Intake/Output this shift:   Nutritional status: NPO  Neurologic Exam: Mental Status:  Intubated. Awake and alert. Cranial Nerves:  Pupils 4 mm bilaterally, reactive to light. No gaze preference. EOM full without nystagmus. Face is symmetric.   Motor:  Moves all extremities against gravity reasonably symmetrically Sensory: Sensory intact throughout Deep Tendon Reflexes: 2+ and symmetric throughout  Plantars:  Right: downgoing      Left: downgoing  Cerebellar:  Unable to test due to restraints  Lab Results: Basic Metabolic Panel:  Recent Labs Lab 11/16/12 0109 11/16/12 0244  NA 143 142  K 3.4* 3.3*  CL 105 104  CO2  --  26  GLUCOSE 108* 96  BUN 9 10  CREATININE 0.70 0.67  CALCIUM  --  9.6    Liver Function Tests:  Recent Labs Lab 11/16/12 0244  AST 38*  ALT 49*  ALKPHOS 63  BILITOT 0.2*  PROT 7.0  ALBUMIN 3.7   No results found for this basename: LIPASE, AMYLASE,  in the last 168 hours No results found for this basename: AMMONIA,  in the last 168 hours  CBC:  Recent Labs Lab 11/16/12 0052 11/16/12 0109   WBC 12.5*  --   HGB 13.5 13.9  HCT 39.0 41.0  MCV 87.8  --   PLT 248  --     Cardiac Enzymes: No results found for this basename: CKTOTAL, CKMB, CKMBINDEX, TROPONINI,  in the last 168 hours  Lipid Panel: No results found for this basename: CHOL, TRIG, HDL, CHOLHDL, VLDL, LDLCALC,  in the last 168 hours  CBG: No results found for this basename: GLUCAP,  in the last 168 hours  Microbiology: Results for orders placed during the hospital encounter of 11/16/12  MRSA PCR SCREENING     Status: None   Collection Time    11/16/12  5:50 AM      Result Value Range Status   MRSA by PCR NEGATIVE  NEGATIVE Final   Comment:            The GeneXpert MRSA Assay (FDA     approved for NASAL specimens     only), is one component of a     comprehensive MRSA colonization     surveillance program. It is not     intended to diagnose MRSA     infection nor to guide or     monitor treatment for     MRSA infections.    Coagulation Studies: No results found for this basename: LABPROT, INR,  in the last 72  hours  Imaging: Ct Head Wo Contrast  11/16/2012  *RADIOLOGY REPORT*  Clinical Data:  Seizure.  CT HEAD WITHOUT CONTRAST  Technique: Contiguous axial images were obtained from the base of the skull through the vertex without contrast  Comparison: None  Findings:  There is no evidence of intracranial hemorrhage, brain edema, or other signs of acute infarction.  There is no evidence of intracranial mass lesion or mass effect.  No abnormal extraaxial fluid collections are identified.  There is no evidence of hydrocephalus, or other significant intracranial abnormality.  No skull abnormality identified.  Diffuse mucosal thickening is seen involving the sphenoid sinus.  IMPRESSION: 1.  Negative non-contrast head CT. 2.  Chronic sphenoid sinusitis.   Original Report Authenticated By: Myles Rosenthal, M.D.    Dg Chest Port 1 View  11/16/2012  *RADIOLOGY REPORT*  Clinical Data: Seizure.  Nasogastric tube  placement.  Respiratory failure.  PORTABLE CHEST - 1 VIEW  Comparison: 11/16/2012  Findings: A new nasogastric is seen entering the stomach. Endotracheal tube tip is now near the carina.  Both lungs are clear.  Heart size is normal.  IMPRESSION:  1.  New nasogastric tube enters the stomach.  Low endotracheal tube position with tip near the carina. 2.  No active lung disease.   Original Report Authenticated By: Myles Rosenthal, M.D.    Dg Chest Portable 1 View  11/16/2012  *RADIOLOGY REPORT*  Clinical Data: Seizure.  Respiratory failure.  PORTABLE CHEST - 1 VIEW  Comparison: None.  Findings: Endotracheal tube is seen in the trachea with the tip approximately 3.5 cm above the carina.  Both lungs are clear.  No evidence of pneumothorax or pleural effusion.  Heart size and mediastinal contours are normal.  IMPRESSION: Endotracheal tube in appropriate position.  No active lung disease.   Original Report Authenticated By: Myles Rosenthal, M.D.     Medications:  I have reviewed the patient's current medications. Scheduled: . antiseptic oral rinse  15 mL Mouth Rinse QID  . [START ON 11/17/2012] cefTRIAXone (ROCEPHIN)  IV  1 g Intravenous Q24H  . chlorhexidine  15 mL Mouth Rinse BID  . etomidate      . etomidate      . heparin  5,000 Units Subcutaneous Q8H  . levETIRAcetam  500 mg Intravenous Q12H  . levETIRAcetam  500 mg Intravenous Q12H  . lidocaine (cardiac) 100 mg/104ml      . lidocaine (cardiac) 100 mg/31ml      . pantoprazole (PROTONIX) IV  40 mg Intravenous Q24H  . propofol      . propofol      . rocuronium      . rocuronium      . banana bag IV 1000 mL ** MVI currently unavailable **   Intravenous Once  . succinylcholine      . succinylcholine        Assessment/Plan: 27 year old female presenting with seizure activity.  Now on Keppra.  No further seizures noetd since admission.  Awake and alert today.  No side effects noted to Keppra.  Head CT reviewed and unremarkable.  Unclear at this time whether  seizures related to Methadone discontinuation but currently patient without further clinical evidence of withdrawal  Recommendations: 1.  Would continue Keppra for now and change to po once patient able to take po 2.  EEG pending 3.  Would not restart Methadone at this time.      LOS: 0 days   Thana Farr, MD [facility] [phone]  11/16/2012  7:44 AM

## 2012-11-16 NOTE — Progress Notes (Signed)
At 2:55pm pt began full body seizures with arms contracted in and mild back arching with tachycardia in 100-130's lasting approx 5-10sec with 4episodes. Pt then began moaning stating her head hurt and she felt like she could not breath. O2 sats 100% RA, [place] placed on pt for comfort, no distress noted. Pt did not vomit or have incont episode.  Elink and Dr Thad Ranger notified. Orders for ativan given and NPO. Will continue to monitor.

## 2012-11-16 NOTE — Progress Notes (Signed)
SUP ordered  

## 2012-11-16 NOTE — H&P (Signed)
PULMONARY  / CRITICAL CARE MEDICINE H&P  Name: Tammy Winters MRN: [id number] DOB: Dec 24, 1985    ADMISSION DATE:  11/16/2012   CHIEF COMPLAINT:  Seizure  BRIEF PATIENT DESCRIPTION: 27 yo with h/o narcotic abuse presenting to the ED with multiple seizure like activity.  SIGNIFICANT EVENTS / STUDIES:  11/16/12 Intubated, admitted, neuro consulted  LINES / TUBES: ETT 4/13 >>>  CULTURES: Urine cx 4/13 >>>  ANTIBIOTICS: Ceftriaxone 1Q24 4/13 >>>  HISTORY OF PRESENT ILLNESS:  27 yo F with history of opiate of opiate abuse and no other medical problems presents to the ED with seizures.  She was previously on chronic methadone for history of opiate abuse. She was arrested 1 weeks ago.  Tonight she developed seizure activity and was brought to the ED.  On route she was given versed.  In the ED she continued to have episodes of shaking.  Per ED staff reports the episodes were shaking of the bilateral extremities with immediate return to normal mental status.   No loss of continence, tongue biting, eye deviation or preceding symptoms.  She was able to alert staff that she felt the episodes coming on prior to the events. She was after multiple episodes occurred and head CT was felt to be urgent.  PAST MEDICAL HISTORY :  History reviewed. No pertinent past medical history. History reviewed. No pertinent past surgical history. Prior to Admission medications   Not on File   Not on File  FAMILY HISTORY:  History reviewed. No pertinent family history. SOCIAL HISTORY:  has no tobacco, alcohol, and drug history on file.  REVIEW OF SYSTEMS:  Unable to obtain as patient is sedated and intubated.  SUBJECTIVE:   VITAL SIGNS: Temp:  [97.7 F (36.5 C)] 97.7 F (36.5 C) (04/13 0011) Pulse Rate:  [81-90] 81 (04/13 0226) Resp:  [15-31] 16 (04/13 0226) BP: (92-139)/(55-124) 116/76 mmHg (04/13 0226) SpO2:  [98 %-100 %] 100 % (04/13 0226) FiO2 (%):  [100 %] 100 % (04/13 0210) HEMODYNAMICS:   VENTILATOR  SETTINGS: Vent Mode:  [-] PRVC FiO2 (%):  [100 %] 100 % Set Rate:  [16 bmp] 16 bmp Vt Set:  [450 mL] 450 mL PEEP:  [5 cmH20] 5 cmH20 Plateau Pressure:  [12 cmH20] 12 cmH20 INTAKE / OUTPUT: Intake/Output   None     PHYSICAL EXAMINATION: General:  Lying in bed intubated Neuro:  Sedated HEENT: PERRL, ETT in place Cardiovascular:  RRR Lungs:  CTAB Abdomen:  Soft, NT, ND Musculoskeletal:  Joints wnl Skin:  No rash  LABS:  Recent Labs Lab 11/16/12 0052 11/16/12 0109  HGB 13.5 13.9  WBC 12.5*  --   PLT 248  --   NA  --  143  K  --  3.4*  CL  --  105  GLUCOSE  --  108*  BUN  --  9  CREATININE  --  0.70   No results found for this basename: GLUCAP,  in the last 168 hours  CXR: ETT well placed, no lung disease  CT Head 1. Negative non-contrast head CT.  2. Chronic sphenoid sinusitis.   ASSESSMENT / PLAN:  27 yo F with history of opiate abuse opiate abuse and possible seizures.  Seizure activity - Atypical in appearance per ED staff - CT head wnl  - Neurology to evaluate - Ordered EEG - Continue Keppra until neuro evals - Will get CMP, ETOH, APAP and salicylates - Sedation with propofol  Respiratory failure - 2/2 questionable seizure activity and need for airway protection/diagnostic  w/u - Continue vent support for now, consider eval for extubation in AM - Check ABG  UTI - slightly elevated WBC, pos nit/LE - Ceftriaxone - Send culture    I have personally obtained a history, examined the patient, evaluated laboratory and imaging results, formulated the assessment and plan and placed orders. CRITICAL CARE: The patient is critically ill with multiple organ systems failure and requires high complexity decision making for assessment and support, frequent evaluation and titration of therapies, application of advanced monitoring technologies and extensive interpretation of multiple databases. Critical Care Time devoted to patient care services described in this note is 40  minutes.   Larin Weissberg M.D. Pulmonary and Critical Care Medicine [facility] Pager: [phone]  11/16/2012, 2:52 AM

## 2012-11-16 NOTE — Progress Notes (Signed)
PULMONARY  / CRITICAL CARE MEDICINE H&P  Name: Tammy Winters MRN: [id number] DOB: 10-09-1985    ADMISSION DATE:  11/16/2012   CHIEF COMPLAINT:  Seizure  BRIEF PATIENT DESCRIPTION: 27 yo with h/o narcotic abuse presenting to the ED with multiple seizure like activity.  SIGNIFICANT EVENTS / STUDIES:  11/16/12 Intubated, admitted, neuro consulted  LINES / TUBES: ETT 4/13 >>> 4/13  CULTURES: Urine cx 4/13 >>>  ANTIBIOTICS: Ceftriaxone 1Q24 4/13 >>>  HISTORY OF PRESENT ILLNESS:  27 yo F with history of opiate abuse and no other medical problems presents to the ED with seizures.  She was previously on chronic methadone for history of opiate abuse. She was arrested 1 weeks ago.  Tonight she developed seizure activity and was brought to the ED.  On route she was given versed.  In the ED she continued to have episodes of shaking.  Per ED staff reports the episodes were shaking of the bilateral extremities with immediate return to normal mental status.   No loss of continence, tongue biting, eye deviation or preceding symptoms.  She was able to alert staff that she felt the episodes coming on prior to the events. She was intubated after multiple episodes occurred and head CT was felt to be urgent. UDS POS for BDZ & THC  SUBJECTIVE: denies pain  VITAL SIGNS: Temp:  [95.9 F (35.5 C)-98.4 F (36.9 C)] 98.4 F (36.9 C) (04/13 0800) Pulse Rate:  [56-124] 69 (04/13 0800) Resp:  [11-31] 14 (04/13 0800) BP: (80-139)/(53-124) 104/61 mmHg (04/13 0800) SpO2:  [98 %-100 %] 100 % (04/13 0800) FiO2 (%):  [30 %-100 %] 30 % (04/13 0800) Weight:  [66.1 kg (145 lb 11.6 oz)-68.04 kg (150 lb)] 66.1 kg (145 lb 11.6 oz) (04/13 0530) HEMODYNAMICS:   VENTILATOR SETTINGS: Vent Mode:  [-] PRVC FiO2 (%):  [30 %-100 %] 30 % Set Rate:  [16 bmp] 16 bmp Vt Set:  [450 mL] 450 mL PEEP:  [5 cmH20] 5 cmH20 Pressure Support:  [5 cmH20] 5 cmH20 Plateau Pressure:  [12 cmH20-14 cmH20] 14 cmH20 INTAKE /  OUTPUT: Intake/Output     04/12 0701 - 04/13 0700 04/13 0701 - 04/14 0700   I.V. (mL/kg) 704.9 (10.7) 127.6 (1.9)   IV Piggyback 500    Total Intake(mL/kg) 1204.9 (18.2) 127.6 (1.9)   Urine (mL/kg/hr) 250    Total Output 250     Net +954.9 +127.6          PHYSICAL EXAMINATION: General:  Lying in bed intubated Neuro:  Sedated HEENT: PERRL, ETT in place Cardiovascular:  RRR Lungs:  CTAB Abdomen:  Soft, NT, ND Musculoskeletal:  Joints wnl Skin:  No rash  LABS:  Recent Labs Lab 11/16/12 0052 11/16/12 0109 11/16/12 0244 11/16/12 0358  HGB 13.5 13.9  --   --   WBC 12.5*  --   --   --   PLT 248  --   --   --   NA  --  143 142  --   K  --  3.4* 3.3*  --   CL  --  105 104  --   CO2  --   --  26  --   GLUCOSE  --  108* 96  --   BUN  --  9 10  --   CREATININE  --  0.70 0.67  --   CALCIUM  --   --  9.6  --   AST  --   --  38*  --  ALT  --   --  49*  --   ALKPHOS  --   --  63  --   BILITOT  --   --  0.2*  --   PROT  --   --  7.0  --   ALBUMIN  --   --  3.7  --   PHART  --   --   --  7.517*  PCO2ART  --   --   --  35.1  PO2ART  --   --   --  457.0*   No results found for this basename: GLUCAP,  in the last 168 hours  CXR: ETT  at [place], no lung disease  CT Head 1. Negative non-contrast head CT.  2. Chronic sphenoid sinusitis.   ASSESSMENT / PLAN:  27 yo F with history of opiate abuse and possible seizures.  Seizure activity - Atypical in appearance per ED staff - CT head wnl  - Neurology following -- await EEG - Continue Keppra  -Called  Alcohol [facility] Drug services , verified methadone dose at 95 mg- will restart 20 bid to prevent withdrawal  Respiratory failure - 2/2 questionable seizure activity and need for airway protection/diagnostic w/u - extubate, excellent Tv on PS 5/5   UTI - slightly elevated WBC, pos nit/LE - Ceftriaxone - Send culture    I have personally obtained a history, examined the patient, evaluated laboratory and imaging results,  formulated the assessment and plan and placed orders. CRITICAL CARE: The patient is critically ill with multiple organ systems failure and requires high complexity decision making for assessment and support, frequent evaluation and titration of therapies, application of advanced monitoring technologies and extensive interpretation of multiple databases. Addn Critical Care Time devoted to patient care services described in this note is 31 minutes.   Cyril Mourning MD. Tonny Bollman. [place] [facility] Pager [phone] If no response call [phone]    11/16/2012, 9:17 AM

## 2012-11-16 NOTE — Consult Note (Signed)
NEURO HOSPITALIST CONSULT NOTE    Reason for Consult: recurrent seizures, methadone withdrawal.  HPI:                                                                                                                                          Tammy Winters is an 27 y.o. female with a past medical history significant for substance abuse, anxiety, brought to [facility]ED after having a recurrent seizure while in jail tonight. Patient sustained 4 seizures in the ED and is currently intubated on propofol. As per chart review, she has been going to the methadone clinic for the last 4 years, but was arrested last week and hasn't had any methadone since her arrest. She was alert and awake upon arrival to the ED and was able to expressed to the staff that she never had seizures before. Then, she sustained back to back " seizure like activity with immediate return to normal mental status". Of note, she was able to alert the nursing staff that she felt the episodes coming on. She was loaded with 1 gram IV keppra and 2 mg IV ativan, and was subsequently intubated and started on propofol. CT brain unremarkable. Urine drug screen positive for marihuana. No report of recent fever, systemic illnesses, or severe head trauma.  History reviewed. No pertinent past medical history.  History reviewed. No pertinent past surgical history.  History reviewed. No pertinent family history.  Family History: unknown.   Social History:  has no tobacco, alcohol, and drug history on file.  Not on File  MEDICATIONS:                                                                                                                     I have reviewed the patient's current medications.   ROS:  History obtained from chart review, as she is intubated on propofol.  General ROS:  negative for - chills, fatigue, fever, night sweats, weight gain or weight loss   Blood pressure 116/76, pulse 81, temperature 97.7 F (36.5 C), temperature source Oral, resp. rate 16, height 5\' 7"  (1.702 m), weight 68.04 kg (150 lb), last menstrual period 11/16/2012, SpO2 100.00%.   Neurologic Examination:  Limited, as she is intubated on propofol.                                                                              Mental Status: Intubated.  Cranial Nerves: Pupils 4 mm bilaterally, reactive to light. No gaze preference. EOM full without nystagmus. Face is symmetric. Tongue: intubated. Motor: Seems to be able to move all extremities symmetrically. Sensory: withdraws to pain. Deep Tendon Reflexes: 2+ and symmetric throughout Plantars: Right: downgoing   Left: downgoing Cerebellar: Unable to test. Gait: no tested. No meningeal irritation signs. CV: pulses palpable throughout    No results found for this basename: cbc, bmp, coags, chol, tri, ldl, hga1c    Results for orders placed during the hospital encounter of 11/16/12 (from the past 48 hour(s))  CBC     Status: Abnormal   Collection Time    11/16/12 12:52 AM      Result Value Range   WBC 12.5 (*) 4.0 - 10.5 K/uL   RBC 4.44  3.87 - 5.11 MIL/uL   Hemoglobin 13.5  12.0 - 15.0 g/dL   HCT 16.1  09.6 - 04.5 %   MCV 87.8  78.0 - 100.0 fL   MCH 30.4  26.0 - 34.0 pg   MCHC 34.6  30.0 - 36.0 g/dL   RDW 40.9  81.1 - 91.4 %   Platelets 248  150 - 400 K/uL  POCT I-STAT, CHEM 8     Status: Abnormal   Collection Time    11/16/12  1:09 AM      Result Value Range   Sodium 143  135 - 145 mEq/L   Potassium 3.4 (*) 3.5 - 5.1 mEq/L   Chloride 105  96 - 112 mEq/L   BUN 9  6 - 23 mg/dL   Creatinine, Ser 7.82  0.50 - 1.10 mg/dL   Glucose, Bld 956 (*) 70 - 99 mg/dL   Calcium, Ion 2.13  0.86 - 1.23 mmol/L   TCO2 25  0 - 100 mmol/L   Hemoglobin 13.9  12.0 - 15.0 g/dL   HCT 57.8  46.9 - 62.9 %  URINALYSIS, ROUTINE W REFLEX  MICROSCOPIC     Status: Abnormal   Collection Time    11/16/12  1:13 AM      Result Value Range   Color, Urine YELLOW  YELLOW   APPearance CLOUDY (*) CLEAR   Specific Gravity, Urine 1.025  1.005 - 1.030   pH 6.0  5.0 - 8.0   Glucose, UA NEGATIVE  NEGATIVE mg/dL   Hgb urine dipstick NEGATIVE  NEGATIVE   Bilirubin Urine NEGATIVE  NEGATIVE   Ketones, ur NEGATIVE  NEGATIVE mg/dL   Protein, ur NEGATIVE  NEGATIVE mg/dL   Urobilinogen, UA 0.2  0.0 - 1.0 mg/dL   Nitrite POSITIVE (*)  NEGATIVE   Leukocytes, UA SMALL (*) NEGATIVE  URINE RAPID DRUG SCREEN (HOSP PERFORMED)     Status: Abnormal   Collection Time    11/16/12  1:13 AM      Result Value Range   Opiates NONE DETECTED  NONE DETECTED   Cocaine NONE DETECTED  NONE DETECTED   Benzodiazepines POSITIVE (*) NONE DETECTED   Amphetamines NONE DETECTED  NONE DETECTED   Tetrahydrocannabinol POSITIVE (*) NONE DETECTED   Barbiturates NONE DETECTED  NONE DETECTED   Comment:            DRUG SCREEN FOR MEDICAL PURPOSES     ONLY.  IF CONFIRMATION IS NEEDED     FOR ANY PURPOSE, NOTIFY LAB     WITHIN 5 DAYS.                LOWEST DETECTABLE LIMITS     FOR URINE DRUG SCREEN     Drug Class       Cutoff (ng/mL)     Amphetamine      1000     Barbiturate      200     Benzodiazepine   200     Tricyclics       300     Opiates          300     Cocaine          300     THC              50  URINE MICROSCOPIC-ADD ON     Status: Abnormal   Collection Time    11/16/12  1:13 AM      Result Value Range   Squamous Epithelial / LPF RARE  RARE   WBC, UA 3-6  <3 WBC/hpf   RBC / HPF 0-2  <3 RBC/hpf   Bacteria, UA MANY (*) RARE   Crystals CA OXALATE CRYSTALS (*) NEGATIVE  POCT PREGNANCY, URINE     Status: None   Collection Time    11/16/12  1:16 AM      Result Value Range   Preg Test, Ur NEGATIVE  NEGATIVE   Comment:            THE SENSITIVITY OF THIS     METHODOLOGY IS >24 mIU/mL    Ct Head Wo Contrast  11/16/2012  *RADIOLOGY REPORT*  Clinical  Data:  Seizure.  CT HEAD WITHOUT CONTRAST  Technique: Contiguous axial images were obtained from the base of the skull through the vertex without contrast  Comparison: None  Findings:  There is no evidence of intracranial hemorrhage, brain edema, or other signs of acute infarction.  There is no evidence of intracranial mass lesion or mass effect.  No abnormal extraaxial fluid collections are identified.  There is no evidence of hydrocephalus, or other significant intracranial abnormality.  No skull abnormality identified.  Diffuse mucosal thickening is seen involving the sphenoid sinus.  IMPRESSION: 1.  Negative non-contrast head CT. 2.  Chronic sphenoid sinusitis.   Original Report Authenticated By: Myles Rosenthal, M.D.    Dg Chest Portable 1 View  11/16/2012  *RADIOLOGY REPORT*  Clinical Data: Seizure.  Respiratory failure.  PORTABLE CHEST - 1 VIEW  Comparison: None.  Findings: Endotracheal tube is seen in the trachea with the tip approximately 3.5 cm above the carina.  Both lungs are clear.  No evidence of pneumothorax or pleural effusion.  Heart size and mediastinal contours are normal.  IMPRESSION: Endotracheal tube in appropriate  position.  No active lung disease.   Original Report Authenticated By: Myles Rosenthal, M.D.      Assessment/Plan: Symptomatic seizures ( SE?)  in the context of opiate withdrawal. However, some concerns about patient's seizure semiology. In any case, continue keppra and get EEG today. Will follow up.   Wyatt Portela, MD [facility] [phone]  11/16/2012, 3:14 AM

## 2012-11-16 NOTE — Procedures (Signed)
Extubation Procedure Note  Patient Details:   Name: Tammy Winters DOB: Mar 19, 1986 MRN: [id number]   Airway Documentation:     Evaluation  O2 sats: stable throughout Complications: No apparent complications Patient did tolerate procedure well. Bilateral Breath Sounds: Clear Suctioning: Airway Yes  Pt extubated per MD order.  Pt tolerated well. O2 sats 99% on 2l [place].  Closson, Terie Purser 11/16/2012, 9:18 AM

## 2012-11-16 NOTE — ED Notes (Signed)
Resp therapy paged

## 2012-11-16 NOTE — ED Notes (Signed)
Pt seized 4 times while RN in the room. Seizure lasted about 20 seconds each time. During seizure pt tilted head back and eyes rolled backwards. Pt did not answer RN's questions during seizure, but would answer them after she stopped seizing.

## 2012-11-17 ENCOUNTER — Inpatient Hospital Stay (HOSPITAL_COMMUNITY)

## 2012-11-17 LAB — RENAL FUNCTION PANEL
Albumin: 3.1 g/dL — ABNORMAL LOW (ref 3.5–5.2)
BUN: 8 mg/dL (ref 6–23)
Creatinine, Ser: 0.6 mg/dL (ref 0.50–1.10)
Phosphorus: 3.4 mg/dL (ref 2.3–4.6)
Potassium: 4 mEq/L (ref 3.5–5.1)

## 2012-11-17 MED ORDER — ACETAMINOPHEN 325 MG PO TABS
650.0000 mg | ORAL_TABLET | Freq: Four times a day (QID) | ORAL | Status: DC | PRN
  Administered 2012-11-17 – 2012-11-18 (×2): 650 mg via ORAL
  Filled 2012-11-17: qty 2

## 2012-11-17 MED ORDER — LEVETIRACETAM 500 MG PO TABS
500.0000 mg | ORAL_TABLET | Freq: Two times a day (BID) | ORAL | Status: DC
  Administered 2012-11-17: 500 mg via ORAL
  Filled 2012-11-17 (×2): qty 1

## 2012-11-17 MED ORDER — ACETAMINOPHEN 325 MG PO TABS
650.0000 mg | ORAL_TABLET | Freq: Three times a day (TID) | ORAL | Status: DC | PRN
  Filled 2012-11-17: qty 2

## 2012-11-17 MED ORDER — PROMETHAZINE HCL 25 MG/ML IJ SOLN
12.5000 mg | Freq: Four times a day (QID) | INTRAMUSCULAR | Status: DC | PRN
  Administered 2012-11-17 – 2012-11-18 (×3): 12.5 mg via INTRAVENOUS
  Filled 2012-11-17 (×3): qty 1

## 2012-11-17 MED ORDER — LEVETIRACETAM 750 MG PO TABS
750.0000 mg | ORAL_TABLET | Freq: Two times a day (BID) | ORAL | Status: DC
  Administered 2012-11-17 – 2012-11-18 (×2): 750 mg via ORAL
  Filled 2012-11-17 (×3): qty 1

## 2012-11-17 NOTE — Progress Notes (Signed)
EEG completed.

## 2012-11-17 NOTE — Clinical Social Work Note (Signed)
Clinical Social Worker received order due to concerns regarding patient being homeless at discharge.  Patient is in police custody and will remain in police custody at discharge.  Patient will be able to work with jail staff regarding her housing needs once released from the jail.  CSW with limited ability to assist patient at this time due to current custody status.   CSW signing off - please re consult if social work needs arise.  Macario Golds, [place] [phone]

## 2012-11-17 NOTE — Progress Notes (Signed)
Tammy Winters [id number] Code Status:full Admission Data: 11/17/2012 7:12 PM Attending Provider:  Vassie Loll PCP:No primary provider on file. Consults/ Treatment Team: Treatment Team:  Kym Groom, MD  Tammy Winters is a 27 y.o. female patient admitted from ED awake, alert - oriented  X 3 - no acute distress noted.  VSS - Blood pressure 105/65, pulse 73, temperature 99 F (37.2 C), temperature source Oral, resp. rate 18, height 5\' 2"  (1.575 m), weight 70.8 kg (156 lb 1.4 oz), last menstrual period 11/16/2012, SpO2 100.00%.  no c/o shortness of breath, no c/o chest pain.  IV Fluids:  IV in place, occlusive dsg intact without redness, IV cath forearm left, condition patent and no redness none.  Allergies:   Allergies  Allergen Reactions  . Adhesive (Tape) Other (See Comments)    redness, burning, skin tears  . Reglan (Metoclopramide) Other (See Comments)    body twitching   . Zofran (Ondansetron Hcl) Other (See Comments)    Body twitching     Past Medical History  Diagnosis Date  . Zoster   . Drug addict   . Heroin abuse   . Anxiety    Medications Prior to Admission  Medication Sig Dispense Refill  . acetaminophen (TYLENOL) 325 MG tablet Take 650 mg by mouth 3 (three) times daily as needed for pain.      . cloNIDine (CATAPRES) 0.1 MG tablet Take 0.1 mg by mouth 3 (three) times daily.      Marland Kitchen dimenhyDRINATE (DRAMAMINE) 50 MG tablet Take 50 mg by mouth 4 (four) times daily - after meals and at bedtime.      . hydrOXYzine (ATARAX/VISTARIL) 25 MG tablet Take 25 mg by mouth 4 (four) times daily as needed for anxiety.      Marland Kitchen levothyroxine (SYNTHROID, LEVOTHROID) 125 MCG tablet Take 125 mcg by mouth daily before breakfast.      . methadone (DOLOPHINE) 10 MG/ML solution Take 95 mg by mouth daily.      . traZODone (DESYREL) 100 MG tablet Take 100 mg by mouth at bedtime.       History:  obtained from the patient. Tobacco/alcohol: denied social drinker  Orientation to room, and  floor completed with information packet given to patient/family.  Patient declined safety video at this time.  Admission INP armband ID verified with patient/family, and in place.   SR up x 2, fall assessment complete, with patient and family able to verbalize understanding of risk associated with falls, and verbalized understanding to call nsg before up out of bed.  Call light within reach, patient able to voice, and demonstrate understanding.  Skin, clean-dry- intact without evidence of bruising, or skin tears.   No evidence of skin break down noted on exam.     Will cont to eval and treat per MD orders.  Orvan Seen, RN 11/17/2012 7:12 PM

## 2012-11-17 NOTE — Progress Notes (Signed)
Pt requesting nausea medicine. RN paged MD on call and is awaiting further orders.

## 2012-11-17 NOTE — Progress Notes (Signed)
UR completed 

## 2012-11-17 NOTE — Progress Notes (Signed)
Subjective: Patient extubated, awake and alert.  Follows commands.  Had two seizures on yesterday.  The last one was at about 3pm.  Patient was given Ativan at that time.  Remains on Keppra 500mg  BID and seems to be tolerating it well.    Objective: Current vital signs: BP 89/57  Pulse 55  Temp(Src) 98.5 F (36.9 C) (Oral)  Resp 17  Ht 5\' 2"  (1.575 m)  Wt 70.8 kg (156 lb 1.4 oz)  BMI 28.54 kg/m2  SpO2 100%  LMP 11/16/2012 Vital signs in last 24 hours: Temp:  [98.1 F (36.7 C)-99 F (37.2 C)] 98.5 F (36.9 C) (04/14 0817) Pulse Rate:  [25-111] 55 (04/14 0700) Resp:  [11-26] 17 (04/14 0700) BP: (77-138)/(41-99) 89/57 mmHg (04/14 0700) SpO2:  [94 %-100 %] 100 % (04/14 0700) Weight:  [70.8 kg (156 lb 1.4 oz)] 70.8 kg (156 lb 1.4 oz) (04/14 0500)  Intake/Output from previous day: 04/13 0701 - 04/14 0700 In: 1497.6 [I.V.:1392.6; IV Piggyback:105] Out: 550 [Urine:550] Intake/Output this shift:   Nutritional status: Cardiac  Neurologic Exam: Mental Status: Alert, oriented, thought content appropriate.  Speech fluent without evidence of aphasia.  Able to follow 3 step commands without difficulty. Cranial Nerves: II: Discs flat bilaterally; Visual fields grossly normal, pupils equal, round, reactive to light and accommodation III,IV, VI: ptosis not present, extra-ocular motions intact bilaterally V,VII: smile symmetric, facial light touch sensation normal bilaterally VIII: hearing normal bilaterally IX,X: gag reflex present XI: bilateral shoulder shrug XII: midline tongue extension Motor: Right : Upper extremity   5/5    Left:     Upper extremity   5/5  Lower extremity   5/5     Lower extremity   5/5 Tone and bulk:normal tone throughout; no atrophy noted Sensory: Pinprick and light touch intact throughout, bilaterally Deep Tendon Reflexes: 2+ and symmetric throughout Plantars: Right: downgoing   Left: downgoing Cerebellar: normal finger-to-nose and normal heel-to-shin  test   Lab Results: Basic Metabolic Panel:  Recent Labs Lab 11/16/12 0109 11/16/12 0244 11/17/12 0454  NA 143 142 141  K 3.4* 3.3* 4.0  CL 105 104 109  CO2  --  26 24  GLUCOSE 108* 96 81  BUN 9 10 8   CREATININE 0.70 0.67 0.60  CALCIUM  --  9.6 8.6  PHOS  --   --  3.4    Liver Function Tests:  Recent Labs Lab 11/16/12 0244 11/17/12 0454  AST 38*  --   ALT 49*  --   ALKPHOS 63  --   BILITOT 0.2*  --   PROT 7.0  --   ALBUMIN 3.7 3.1*   No results found for this basename: LIPASE, AMYLASE,  in the last 168 hours No results found for this basename: AMMONIA,  in the last 168 hours  CBC:  Recent Labs Lab 11/16/12 0052 11/16/12 0109  WBC 12.5*  --   HGB 13.5 13.9  HCT 39.0 41.0  MCV 87.8  --   PLT 248  --     Cardiac Enzymes: No results found for this basename: CKTOTAL, CKMB, CKMBINDEX, TROPONINI,  in the last 168 hours  Lipid Panel: No results found for this basename: CHOL, TRIG, HDL, CHOLHDL, VLDL, LDLCALC,  in the last 168 hours  CBG: No results found for this basename: GLUCAP,  in the last 168 hours  Microbiology: Results for orders placed during the hospital encounter of 11/16/12  MRSA PCR SCREENING     Status: None   Collection Time  11/16/12  5:50 AM      Result Value Range Status   MRSA by PCR NEGATIVE  NEGATIVE Final   Comment:            The GeneXpert MRSA Assay (FDA     approved for NASAL specimens     only), is one component of a     comprehensive MRSA colonization     surveillance program. It is not     intended to diagnose MRSA     infection nor to guide or     monitor treatment for     MRSA infections.    Coagulation Studies: No results found for this basename: LABPROT, INR,  in the last 72 hours  Imaging: Ct Head Wo Contrast  11/16/2012  *RADIOLOGY REPORT*  Clinical Data:  Seizure.  CT HEAD WITHOUT CONTRAST  Technique: Contiguous axial images were obtained from the base of the skull through the vertex without contrast   Comparison: None  Findings:  There is no evidence of intracranial hemorrhage, brain edema, or other signs of acute infarction.  There is no evidence of intracranial mass lesion or mass effect.  No abnormal extraaxial fluid collections are identified.  There is no evidence of hydrocephalus, or other significant intracranial abnormality.  No skull abnormality identified.  Diffuse mucosal thickening is seen involving the sphenoid sinus.  IMPRESSION: 1.  Negative non-contrast head CT. 2.  Chronic sphenoid sinusitis.   Original Report Authenticated By: Myles Rosenthal, M.D.    Dg Chest Port 1 View  11/16/2012  *RADIOLOGY REPORT*  Clinical Data: Seizure.  Nasogastric tube placement.  Respiratory failure.  PORTABLE CHEST - 1 VIEW  Comparison: 11/16/2012  Findings: A new nasogastric is seen entering the stomach. Endotracheal tube tip is now near the carina.  Both lungs are clear.  Heart size is normal.  IMPRESSION:  1.  New nasogastric tube enters the stomach.  Low endotracheal tube position with tip near the carina. 2.  No active lung disease.   Original Report Authenticated By: Myles Rosenthal, M.D.    Dg Chest Portable 1 View  11/16/2012  *RADIOLOGY REPORT*  Clinical Data: Seizure.  Respiratory failure.  PORTABLE CHEST - 1 VIEW  Comparison: None.  Findings: Endotracheal tube is seen in the trachea with the tip approximately 3.5 cm above the carina.  Both lungs are clear.  No evidence of pneumothorax or pleural effusion.  Heart size and mediastinal contours are normal.  IMPRESSION: Endotracheal tube in appropriate position.  No active lung disease.   Original Report Authenticated By: Myles Rosenthal, M.D.     Medications:  I have reviewed the patient's current medications. Scheduled: . cefTRIAXone (ROCEPHIN)  IV  1 g Intravenous Q24H  . heparin  5,000 Units Subcutaneous Q8H  . levETIRAcetam  750 mg Oral BID  . methadone  20 mg Oral BID  . banana bag IV 1000 mL ** MVI currently unavailable **   Intravenous Once     Assessment/Plan: Patient without seizures overnight but did have two events on yesterday requiring Ativan.  Exam nonfocal.  Mental status intact.    Recommemdations: 1.  Increase Keppra to 750mg  BID 2.  Will follow up EEG 3.  Continue seizure precautions   LOS: 1 day   Thana Farr, MD [facility] [phone] 11/17/2012  9:56 AM

## 2012-11-17 NOTE — Progress Notes (Signed)
eLink Physician-Brief Progress Note Patient Name: Tammy Winters DOB: Aug 07, 1985 MRN: [id number]  Date of Service  11/17/2012   HPI/Events of Note   Pt c/o nausea, is allergic to zofran  eICU Interventions  IV promethazine ordered   Intervention Category Minor Interventions: Routine modifications to care plan (e.g. PRN medications for pain, fever)  Shan Levans 11/17/2012, 10:14 PM

## 2012-11-17 NOTE — Progress Notes (Signed)
PT stated her head feels funny like it did before she had her previous seizure. RN spoke with MD on call Delford Field who put in an order for phenergan and stated to continue to monitor pt.

## 2012-11-17 NOTE — Plan of Care (Signed)
Problem: Phase II Progression Outcomes Goal: Seizure activity resolved/baseline Outcome: Progressing No seizures since 3 pm 11/16/12

## 2012-11-17 NOTE — Progress Notes (Signed)
PULMONARY  / CRITICAL CARE MEDICINE H&P  Name: Tammy Winters MRN: [id number] DOB: January 28, 1986    ADMISSION DATE:  11/16/2012   CHIEF COMPLAINT:  Seizure  BRIEF PATIENT DESCRIPTION: 27 yo with h/o narcotic abuse presenting to the ED with multiple seizure like activity.  SIGNIFICANT EVENTS / STUDIES:  11/16/12 Intubated, admitted, neuro consulted  LINES / TUBES: ETT 4/13 >>> 4/13  CULTURES: Urine cx 4/13 >>>  ANTIBIOTICS: Ceftriaxone 1Q24 4/13 >>>  HISTORY OF PRESENT ILLNESS:  27 yo F with history of opiate abuse and no other medical problems presents to the ED with seizures.  She was previously on chronic methadone for history of opiate abuse. She was arrested 1 weeks ago.  Tonight she developed seizure activity and was brought to the ED.  On route she was given versed.  In the ED she continued to have episodes of shaking.  Per ED staff reports the episodes were shaking of the bilateral extremities with immediate return to normal mental status.   No loss of continence, tongue biting, eye deviation or preceding symptoms.  She was able to alert staff that she felt the episodes coming on prior to the events. She was intubated after multiple episodes occurred and head CT was felt to be urgent. UDS POS for BDZ & THC  SUBJECTIVE: denies pain 4/13 episode of seizure after c/o head 'swimming' - observer felt 'pseudoseizure' C/o 'feels like withdrawal'  VITAL SIGNS: Temp:  [98.1 F (36.7 C)-99 F (37.2 C)] 98.5 F (36.9 C) (04/14 0817) Pulse Rate:  [25-111] 55 (04/14 0700) Resp:  [9-26] 17 (04/14 0700) BP: (77-138)/(41-99) 89/57 mmHg (04/14 0700) SpO2:  [94 %-100 %] 100 % (04/14 0700) FiO2 (%):  [30.1 %] 30.1 % (04/13 0900) Weight:  [70.8 kg (156 lb 1.4 oz)] 70.8 kg (156 lb 1.4 oz) (04/14 0500) HEMODYNAMICS:   VENTILATOR SETTINGS: Vent Mode:  [-]  FiO2 (%):  [30.1 %] 30.1 % INTAKE / OUTPUT: Intake/Output     04/13 0701 - 04/14 0700 04/14 0701 - 04/15 0700   I.V. (mL/kg) 1392.6  (19.7)    IV Piggyback 105    Total Intake(mL/kg) 1497.6 (21.2)    Urine (mL/kg/hr) 550 (0.3)    Total Output 550     Net +947.6            PHYSICAL EXAMINATION: General:  Lying in bed with legs restless Neuro:  Non focal, interactive HEENT: PERRL Cardiovascular:  RRR Lungs:  CTAB Abdomen:  Soft, NT, ND Musculoskeletal:  Joints wnl Skin:  No rash  LABS:  Recent Labs Lab 11/16/12 0052  11/16/12 0109 11/16/12 0244 11/16/12 0358 11/17/12 0454  HGB 13.5  --  13.9  --   --   --   WBC 12.5*  --   --   --   --   --   PLT 248  --   --   --   --   --   NA  --   --  143 142  --  141  K  --   < > 3.4* 3.3*  --  4.0  CL  --   --  105 104  --  109  CO2  --   --   --  26  --  24  GLUCOSE  --   --  108* 96  --  81  BUN  --   --  9 10  --  8  CREATININE  --   --  0.70  0.67  --  0.60  CALCIUM  --   --   --  9.6  --  8.6  PHOS  --   --   --   --   --  3.4  AST  --   --   --  38*  --   --   ALT  --   --   --  49*  --   --   ALKPHOS  --   --   --  63  --   --   BILITOT  --   --   --  0.2*  --   --   PROT  --   --   --  7.0  --   --   ALBUMIN  --   --   --  3.7  --  3.1*  PHART  --   --   --   --  7.517*  --   PCO2ART  --   --   --   --  35.1  --   PO2ART  --   --   --   --  457.0*  --   < > = values in this interval not displayed. No results found for this basename: GLUCAP,  in the last 168 hours    CT Head 1. Negative non-contrast head CT.  2. Chronic sphenoid sinusitis.   ASSESSMENT / PLAN:  27 yo F with history of opiate abuse and possible 'pseudo'seizures vs withdrawal .  Seizure activity - Atypical in appearance per ED staff - CT head wnl  - Neurology following -- await EEG - Continue Keppra  -Called  [facility] services , verified methadone dose at 95 mg- will restart 20 bid to prevent withdrawal  Respiratory failure - 2/2 questionable seizure activity and need for airway protection/diagnostic w/u - extubated   UTI - slightly elevated WBC, pos nit/LE -  Ceftriaxone - Send culture   OK to transfer to floor & to [facility] Current dose of methadone soul be enough to prevent withdrawal OK to resume diet  Cyril Mourning MD. Tonny Bollman. [facility] Pager [phone] If no response call [phone]    11/17/2012, 8:18 AM

## 2012-11-17 NOTE — Progress Notes (Signed)
Per pt, pt was on suicide precautions at correctional facility.  MD made aware. Verbal order received for suicide precautions. Trash bags changed to paper bags, extra supplies removed from room and extra suction tubing removed. Plastic utensils provided to pt with lunch tray. Pt not verbalizing any suicidal thoughts/threats. Pt then stated that the suicide precautions were lifted on Wednesday. Officer at bedside called correctional facility to verify. Officer stated that he spoke with Lanora Manis, head of the suicide precautions, and confirmed that pt was indeed released from suicide precautions while at the facility before hospital admission. However, plastic utensils were still used. Order adjusted in chart for plastic utensils instead of metal. MD made aware of situation. Pt no longer on suicide precautions.  Officer remains at beside and pt continues to remain cuffed to the bed, L arm and L leg.   Holly Bodily

## 2012-11-18 LAB — URINE CULTURE

## 2012-11-18 MED ORDER — METHADONE HCL 10 MG PO TABS: 40.0000 mg | ORAL_TABLET | Freq: Every day | ORAL | Status: DC

## 2012-11-18 MED ORDER — LEVETIRACETAM 750 MG PO TABS: 750.0000 mg | ORAL_TABLET | Freq: Two times a day (BID) | ORAL | Status: DC

## 2012-11-18 MED ORDER — SULFAMETHOXAZOLE-TMP DS 800-160 MG PO TABS
1.0000 | ORAL_TABLET | Freq: Two times a day (BID) | ORAL | Status: DC
  Administered 2012-11-18: 1 via ORAL
  Filled 2012-11-18 (×3): qty 1

## 2012-11-18 MED ORDER — CAMPHOR-MENTHOL 0.5-0.5 % EX LOTN
TOPICAL_LOTION | CUTANEOUS | Status: DC | PRN
  Administered 2012-11-18: 1 via TOPICAL
  Filled 2012-11-18: qty 222

## 2012-11-18 MED ORDER — METHYLPREDNISOLONE SODIUM SUCC 125 MG IJ SOLR
80.0000 mg | Freq: Once | INTRAMUSCULAR | Status: DC
  Filled 2012-11-18: qty 1.28

## 2012-11-18 MED ORDER — METHADONE HCL 10 MG PO TABS: 40.0000 mg | ORAL_TABLET | Freq: Two times a day (BID) | ORAL | Status: DC

## 2012-11-18 MED ORDER — DIPHENHYDRAMINE HCL 25 MG PO TABS: 25.0000 mg | ORAL_TABLET | Freq: Four times a day (QID) | ORAL | Status: DC | PRN

## 2012-11-18 MED ORDER — METHADONE HCL 10 MG PO TABS
40.0000 mg | ORAL_TABLET | Freq: Once | ORAL | Status: AC
Start: 2012-11-19 — End: 2012-11-19

## 2012-11-18 MED ORDER — METHADONE HCL 10 MG PO TABS: 40.0000 mg | ORAL_TABLET | Freq: Once | ORAL | Status: DC

## 2012-11-18 MED ORDER — NITROFURANTOIN MONOHYD MACRO 100 MG PO CAPS: 100.0000 mg | ORAL_CAPSULE | Freq: Two times a day (BID) | ORAL | Status: DC

## 2012-11-18 MED ORDER — SULFAMETHOXAZOLE-TMP DS 800-160 MG PO TABS: 1.0000 | ORAL_TABLET | Freq: Two times a day (BID) | ORAL | Status: DC

## 2012-11-18 MED ORDER — DIPHENHYDRAMINE HCL 25 MG PO CAPS
25.0000 mg | ORAL_CAPSULE | Freq: Once | ORAL | Status: AC
  Administered 2012-11-18: 25 mg via ORAL

## 2012-11-18 MED ORDER — DIPHENHYDRAMINE HCL 50 MG/ML IJ SOLN
25.0000 mg | Freq: Once | INTRAMUSCULAR | Status: DC
  Filled 2012-11-18: qty 0.5

## 2012-11-18 NOTE — Discharge Summary (Addendum)
Physician Discharge Summary  Tammy Winters ZOX:[id number] DOB: 1985-09-22 DOA: 11/16/2012  PCP: No primary provider on file.  Admit date: 11/16/2012 Discharge date: 11/18/2012  Time spent: 60  minutes  Recommendations for Outpatient Follow-up:   Patient will start at [facility] at 5:00 am on Thursday morning 11/20/2012. She will need to receive a dose of 40 mg of Methadone on 11/19/12.  We will send a prescription with the patient for this dose. Please check for UTI / Yeast infection in 1 week.   Discharge Diagnoses:  Active Problems:   Seizure   Opiate withdrawal   Acute respiratory failure with hypoxia   Discharge Condition: stable  Diet recommendation: regular diet  Filed Weights   11/16/12 0530 11/17/12 0500 11/18/12 0500  Weight: 66.1 kg (145 lb 11.6 oz) 70.8 kg (156 lb 1.4 oz) 69.6 kg (153 lb 7 oz)    History of present illness:  27 yo F with history of opiate abuse and no other medical problems presents to the ED with seizures. She was previously on chronic methadone for history of opiate abuse. She was arrested 1 week ago. Tonight she developed seizure activity and was brought to the ED. On route she was given versed. In the ED she continued to have episodes of shaking. Per ED staff reports the episodes were shaking of the bilateral extremities with immediate return to normal mental status. No loss of continence, tongue biting, eye deviation or preceding symptoms. She was able to alert staff that she felt the episodes coming on prior to the events.   Hospital Course:   Seizures The patient had multiple episodes of seizure in the Emergency Department that necessitated intubation and sedation with propofol. She was managed in the ICU under critical care.  Arterial blood gas indicated metabolics alkalosis.   Dr. Thad Ranger of Neurology was consulted.  CT of the head was done and showed no acute changes only chronic sinusitis.  Dr. Thad Ranger ordered an EEG -  the  results were normal with no epileptic wave forms.  She was started on Keppra and unfortunately did have additional seizures after admission.  Her seizures were felt to be due to withdraw from Methadone or other recreational drugs.  Her urine drug screen was positive for marijuana and benzodiazepines.   The Keppa dosing was increased and the patient has had no further seizures.  She has been restarted on methadone at 40 mg daily.  UTI Urine culture is positive E Coli.  She has received IV Rocephin as and in patient and will receive macrobid bid as an outpatient to complete her antibiotic course.She was given one dose of oral bactrim-after which developed itching-she will be placed on prn benadryl.     Polysubstance abuse UDS was positive for benzodiazepines and THC.  In jail this will not be an on-going issue.   Procedures:  EEG.  Read by Dr. Thad Ranger.  Normal.  Consultations:  Neurology  Discharge Exam: Filed Vitals:   11/17/12 1900 11/17/12 2110 11/18/12 0500 11/18/12 1300  BP: 105/65 108/70 99/68 96/82   Pulse: 73 70 61 67  Temp: 99 F (37.2 C) 98.7 F (37.1 C) 97.7 F (36.5 C) 98.3 F (36.8 C)  TempSrc: Oral Oral Oral Oral  Resp: 18 16 16 16   Height:      Weight:   69.6 kg (153 lb 7 oz)   SpO2: 100% 99% 99% 98%    General: A&O, NAD, Comfortable in bed.  Legs have hand cuffs on. Cardiovascular:  rrr no m/r/g, no lower extremity edema Respiratory: cta no w/c/r, no accessory muscle movement Abdomen:  Soft, nt, nd, +bs, no Masses Skin: no obvious rashes, bruises, or lesions Psych:  A&O, NAD, Appropriate, Cooperative Neurological :  Non focal.  Discharge Instructions      Discharge Orders   Future Orders Complete By Expires     Diet - low sodium heart healthy  As directed     Diet - low sodium heart healthy  As directed     Increase activity slowly  As directed     Increase activity slowly  As directed         Medication List    STOP taking these medications        cloNIDine 0.1 MG tablet  Commonly known as:  CATAPRES     dimenhyDRINATE 50 MG tablet  Commonly known as:  DRAMAMINE     hydrOXYzine 25 MG tablet  Commonly known as:  ATARAX/VISTARIL     methadone 10 MG/ML solution  Commonly known as:  DOLOPHINE      TAKE these medications       acetaminophen 325 MG tablet  Commonly known as:  TYLENOL  Take 650 mg by mouth 3 (three) times daily as needed for pain.     diphenhydrAMINE 25 MG tablet  Commonly known as:  BENADRYL  Take 1 tablet (25 mg total) by mouth every 6 (six) hours as needed for itching.     levETIRAcetam 750 MG tablet  Commonly known as:  KEPPRA  Take 1 tablet (750 mg total) by mouth 2 (two) times daily.     levothyroxine 125 MCG tablet  Commonly known as:  SYNTHROID, LEVOTHROID  Take 125 mcg by mouth daily before breakfast.     methadone 10 MG tablet  Commonly known as:  DOLOPHINE  Take 4 tablets (40 mg total) by mouth 2 (two) times daily.  Start taking on:  11/19/2012     nitrofurantoin (macrocrystal-monohydrate) 100 MG capsule  Commonly known as:  MACROBID  Take 1 capsule (100 mg total) by mouth 2 (two) times daily.     traZODone 100 MG tablet  Commonly known as:  DESYREL  Take 100 mg by mouth at bedtime.       Follow-up Information   Please follow up. (patient will need a u/a in 1 week to ensure UTI has resolved.  At that time eval for yeast infection.)        The results of significant diagnostics from this hospitalization (including imaging, microbiology, ancillary and laboratory) are listed below for reference.    Significant Diagnostic Studies: Ct Head Wo Contrast  11/16/2012  *RADIOLOGY REPORT*  Clinical Data:  Seizure.  CT HEAD WITHOUT CONTRAST  Technique: Contiguous axial images were obtained from the base of the skull through the vertex without contrast  Comparison: None  Findings:  There is no evidence of intracranial hemorrhage, brain edema, or other signs of acute infarction.  There is no  evidence of intracranial mass lesion or mass effect.  No abnormal extraaxial fluid collections are identified.  There is no evidence of hydrocephalus, or other significant intracranial abnormality.  No skull abnormality identified.  Diffuse mucosal thickening is seen involving the sphenoid sinus.  IMPRESSION: 1.  Negative non-contrast head CT. 2.  Chronic sphenoid sinusitis.   Original Report Authenticated By: Myles Rosenthal, M.D.    Dg Chest Port 1 View  11/16/2012  *RADIOLOGY REPORT*  Clinical Data: Seizure.  Nasogastric tube placement.  Respiratory  failure.  PORTABLE CHEST - 1 VIEW  Comparison: 11/16/2012  Findings: A new nasogastric is seen entering the stomach. Endotracheal tube tip is now near the carina.  Both lungs are clear.  Heart size is normal.  IMPRESSION:  1.  New nasogastric tube enters the stomach.  Low endotracheal tube position with tip near the carina. 2.  No active lung disease.   Original Report Authenticated By: Myles Rosenthal, M.D.    Dg Chest Portable 1 View  11/16/2012  *RADIOLOGY REPORT*  Clinical Data: Seizure.  Respiratory failure.  PORTABLE CHEST - 1 VIEW  Comparison: None.  Findings: Endotracheal tube is seen in the trachea with the tip approximately 3.5 cm above the carina.  Both lungs are clear.  No evidence of pneumothorax or pleural effusion.  Heart size and mediastinal contours are normal.  IMPRESSION: Endotracheal tube in appropriate position.  No active lung disease.   Original Report Authenticated By: Myles Rosenthal, M.D.     Microbiology: Recent Results (from the past 240 hour(s))  URINE CULTURE     Status: None   Collection Time    11/16/12  1:13 AM      Result Value Range Status   Specimen Description URINE, CATHETERIZED   Final   Special Requests ADDED 0141   Final   Culture  Setup Time 11/16/2012 13:20   Final   Colony Count >=100,000 COLONIES/ML   Final   Culture ESCHERICHIA COLI   Final   Report Status 11/18/2012 FINAL   Final   Organism ID, Bacteria ESCHERICHIA  COLI   Final  MRSA PCR SCREENING     Status: None   Collection Time    11/16/12  5:50 AM      Result Value Range Status   MRSA by PCR NEGATIVE  NEGATIVE Final   Comment:            The GeneXpert MRSA Assay (FDA     approved for NASAL specimens     only), is one component of a     comprehensive MRSA colonization     surveillance program. It is not     intended to diagnose MRSA     infection nor to guide or     monitor treatment for     MRSA infections.     Labs: Basic Metabolic Panel:  Recent Labs Lab 11/16/12 0109 11/16/12 0244 11/17/12 0454  NA 143 142 141  K 3.4* 3.3* 4.0  CL 105 104 109  CO2  --  26 24  GLUCOSE 108* 96 81  BUN 9 10 8   CREATININE 0.70 0.67 0.60  CALCIUM  --  9.6 8.6  PHOS  --   --  3.4   Liver Function Tests:  Recent Labs Lab 11/16/12 0244 11/17/12 0454  AST 38*  --   ALT 49*  --   ALKPHOS 63  --   BILITOT 0.2*  --   PROT 7.0  --   ALBUMIN 3.7 3.1*   CBC:  Recent Labs Lab 11/16/12 0052 11/16/12 0109  WBC 12.5*  --   HGB 13.5 13.9  HCT 39.0 41.0  MCV 87.8  --   PLT 248  --      Signed:  Stephani Police, PA-C [facility] 11/18/2012, 2:11 PM   Attending Patient seen and examined, the with the above assessment and plan, EEG per Dr. Thad Ranger negative for any epileptic discharges. Remain seizure free. She does have a UTI and was given one dose of oral  Bactrim on which she developed generalized pruritus. This was treated with Benadryl with good result. She is now stable to be discharged home back to [facility]. Followup with the methadone clinic. She has been provided with one dose of methadone for 4/16, she is follow up at the methadone clinic on 4/17  S Jasmyne Lodato

## 2012-11-18 NOTE — Progress Notes (Signed)
Pt. discharged to floor,verbalized understanding of discharged instruction,medication,restriction,diet and follow up appointment.Baseline Vitals sign stable,Pt comfortable,no sign and symptom of distress.Handed officer Tammy Winters the envelope containing patient discharge paper works,prescription,and methadone 10 mg x 4,total 40 mg.

## 2012-11-18 NOTE — Progress Notes (Signed)
   CARE MANAGEMENT NOTE 11/18/2012  Patient:  NASEEM, ADLER   Account Number:  [id number]  Date Initiated:  11/18/2012  Documentation initiated by:  [facility]  Subjective/Objective Assessment:   seizures     Action/Plan:   Anticipated DC Date:  11/18/2012   Anticipated DC Plan:  [facility]      DC Planning Services  CM consult  Medication Assistance      Choice offered to / List presented to:             Status of service:  Completed, signed off Medicare Important Message given?   (If response is "NO", the following Medicare IM given date fields will be blank) Date Medicare IM given:   Date Additional Medicare IM given:    Discharge Disposition:  HOME/SELF CARE  Per UR Regulation:    If discussed at Long Length of Stay Meetings, dates discussed:    Comments:  11/18/2012 1400 NCM received approval for 1 day dose of Methadone from [facility] Pharmacist. Pt scheduled dc back to correctional facility by her detention officer. Spoke to Hollie Salk RN, Prison RN # [phone] and pt will need to have 4/16 dose of Methadone. They do not provide medication in their facility with an evaluation from [facility] # [phone]. Pt has appt on Thurs at 5 am with [facility] and they will arrange for her to MD.  Facility will provide other medications. Isidoro Donning RN CCM Case Mgmt phone [phone]

## 2012-11-19 NOTE — Procedures (Signed)
HISTORY:  A 27 year old female with a history of narcotic abuse, admitted with seizure activity.  MEDICATIONS:  Catapres, Rocephin, methadone, Keppra, trazodone.  CONDITIONS OF RECORDING:  This is a 16-channel EEG carried out with the patient in the awake, drowsy, and asleep states.  DESCRIPTION:  The waking background activity consists of a low-voltage, symmetrical, fairly well organized, 10 hertz alpha activity seen from the parieto-occipital and posterotemporal regions.  Low-voltage, fast activity, poorly organized is seen anteriorly, and at times, superimposed on more posterior rhythms.  A mixture of theta and alpha rhythms are seen from the central and temporal regions.  The patient drowses with slowing to irregular, low-voltage theta and beta activity. The patient goes into a light sleep with symmetrical sleep spindles. The vertex was sharp activity and irregular slow activity. Hyperventilation and intermittent photic stimulation were performed, but both failed to elicit any abnormalities.  IMPRESSION:  This is a normal EEG.          ______________________________ Thana Farr, MD    [id number] D:  11/18/2012 07:54:06  T:  11/19/2012 78:46:96  Job #:  [id number]

## 2012-12-22 ENCOUNTER — Encounter (HOSPITAL_COMMUNITY): Payer: Self-pay | Admitting: Emergency Medicine

## 2012-12-22 ENCOUNTER — Emergency Department (HOSPITAL_COMMUNITY)
Admission: EM | Admit: 2012-12-22 | Discharge: 2012-12-22 | Disposition: A | Discharge status: Home / Self Care | Payer: Self-pay | Attending: Emergency Medicine | Admitting: Emergency Medicine

## 2012-12-22 ENCOUNTER — Emergency Department (HOSPITAL_COMMUNITY): Payer: Self-pay

## 2012-12-22 DIAGNOSIS — Z9089 Acquired absence of other organs: Secondary | ICD-10-CM | POA: Insufficient documentation

## 2012-12-22 DIAGNOSIS — R109 Unspecified abdominal pain: Secondary | ICD-10-CM | POA: Insufficient documentation

## 2012-12-22 DIAGNOSIS — H538 Other visual disturbances: Secondary | ICD-10-CM | POA: Insufficient documentation

## 2012-12-22 DIAGNOSIS — N946 Dysmenorrhea, unspecified: Secondary | ICD-10-CM | POA: Insufficient documentation

## 2012-12-22 DIAGNOSIS — O9989 Other specified diseases and conditions complicating pregnancy, childbirth and the puerperium: Secondary | ICD-10-CM | POA: Insufficient documentation

## 2012-12-22 DIAGNOSIS — O99330 Smoking (tobacco) complicating pregnancy, unspecified trimester: Secondary | ICD-10-CM | POA: Insufficient documentation

## 2012-12-22 DIAGNOSIS — Z9889 Other specified postprocedural states: Secondary | ICD-10-CM | POA: Insufficient documentation

## 2012-12-22 DIAGNOSIS — Z8619 Personal history of other infectious and parasitic diseases: Secondary | ICD-10-CM | POA: Insufficient documentation

## 2012-12-22 DIAGNOSIS — Z8659 Personal history of other mental and behavioral disorders: Secondary | ICD-10-CM | POA: Insufficient documentation

## 2012-12-22 DIAGNOSIS — Z79899 Other long term (current) drug therapy: Secondary | ICD-10-CM | POA: Insufficient documentation

## 2012-12-22 LAB — POCT PREGNANCY, URINE: Preg Test, Ur: NEGATIVE

## 2012-12-22 LAB — URINE MICROSCOPIC-ADD ON

## 2012-12-22 LAB — CBC WITH DIFFERENTIAL/PLATELET
Eosinophils Relative: 1 % (ref 0–5)
HCT: 37.6 % (ref 36.0–46.0)
Hemoglobin: 13 g/dL (ref 12.0–15.0)
Lymphocytes Relative: 25 % (ref 12–46)
Lymphs Abs: 3.8 10*3/uL (ref 0.7–4.0)
MCV: 90.2 fL (ref 78.0–100.0)
Monocytes Absolute: 0.9 10*3/uL (ref 0.1–1.0)
Neutro Abs: 10.4 10*3/uL — ABNORMAL HIGH (ref 1.7–7.7)
RBC: 4.17 MIL/uL (ref 3.87–5.11)
WBC: 15.4 10*3/uL — ABNORMAL HIGH (ref 4.0–10.5)

## 2012-12-22 LAB — URINALYSIS, ROUTINE W REFLEX MICROSCOPIC
Glucose, UA: NEGATIVE mg/dL
Ketones, ur: NEGATIVE mg/dL
Protein, ur: 30 mg/dL — AB

## 2012-12-22 MED ORDER — ACETAMINOPHEN 325 MG PO TABS
650.0000 mg | ORAL_TABLET | Freq: Once | ORAL | Status: AC
  Administered 2012-12-22: 650 mg via ORAL
  Filled 2012-12-22: qty 2

## 2012-12-22 MED ORDER — IBUPROFEN 600 MG PO TABS: 600.0000 mg | ORAL_TABLET | Freq: Four times a day (QID) | ORAL | Status: DC | PRN

## 2012-12-22 NOTE — ED Notes (Signed)
Pt states that she is preg 8 weeks and has blood when she wipes after voiding and it is a small amount and has frequency. For the past few days now.

## 2012-12-22 NOTE — ED Provider Notes (Signed)
History     CSN: [id number]  Arrival date & time 12/22/12  1210   First MD Initiated Contact with Patient 12/22/12 1346      Chief Complaint  Patient presents with  . Vaginal Bleeding    [redacted] weeks pregnant, but has not been to the MD; pt is on Methadone  . Vaginal Pain    throbbing  . Abdominal Pain    lower  . Joint Pain     HPI Per pt: Pt woke up with vaginal spotting accompanied by throbbing vaginal pain along with lower abdominal aching 9/10. Pt also had achy joints and swollen throat (6/10). Pt is around [redacted] weeks pregnant; has not seen OB/GYN as of now. Pt and fiancee had :vigorous" sexual intercourse several times for the past two evenings. Pt is on 71 mg Methadone daily for about 4 years. Pt denies n/v/d and headache, but is having blurry vision. Pt denies lifting anything heavy. Pt walks 5 miles minimum a day as they do not currently have a vehicle. This is pt's fourth pregnancy; 1 miscarriage, 1 stillbirth and two living children. Pt is also on Keppra for seizure activity during a period of time while pt was off Methadone.  Past Medical History  Diagnosis Date  . Zoster   . Drug addict   . Heroin abuse   . Anxiety     Past Surgical History  Procedure Laterality Date  . Cholecystectomy    . Dilation and curettage of uterus      dental  . Dental surgery      all teeth removed    Family History  Problem Relation Age of Onset  . Diabetes Mother   . Cancer Mother   . Diabetes Father   . Cancer Father     History  Substance Use Topics  . Smoking status: Current Some Day Smoker -- 1.00 packs/day  . Smokeless tobacco: Never Used  . Alcohol Use: No    OB History   Grav Para Term Preterm Abortions TAB SAB Ect Mult Living            2      Review of Systems All other systems reviewed and are negative Allergies  Adhesive; Reglan; and Zofran  Home Medications   Current Outpatient Rx  Name  Route  Sig  Dispense  Refill  . acetaminophen (TYLENOL) 325 MG  tablet   Oral   Take 650 mg by mouth 3 (three) times daily as needed for pain.         Marland Kitchen levETIRAcetam (KEPPRA) 750 MG tablet   Oral   Take 1 tablet (750 mg total) by mouth 2 (two) times daily.   60 tablet   3   . Omega-3 Fatty Acids (FISH OIL) 1000 MG CAPS   Oral   Take 2 capsules by mouth daily.         Marland Kitchen PRESCRIPTION MEDICATION      Methadone 71mg  every day         . ibuprofen (ADVIL,MOTRIN) 600 MG tablet   Oral   Take 1 tablet (600 mg total) by mouth every 6 (six) hours as needed for pain.   30 tablet   0   . levothyroxine (SYNTHROID, LEVOTHROID) 125 MCG tablet   Oral   Take 125 mcg by mouth daily before breakfast.           BP 109/70  Pulse 60  Temp(Src) 97.6 F (36.4 C) (Oral)  Resp 20  Ht  5\' 3"  (1.6 m)  Wt 152 lb (68.947 kg)  BMI 26.93 kg/m2  SpO2 100%  LMP 10/27/2012  Physical Exam  Nursing note and vitals reviewed. Constitutional: She is oriented to person, place, and time. She appears well-developed and well-nourished. No distress.  HENT:  Head: Normocephalic and atraumatic.  Eyes: Pupils are equal, round, and reactive to light.  Neck: Normal range of motion.  Cardiovascular: Normal rate and intact distal pulses.   Pulmonary/Chest: No respiratory distress.  Abdominal: Normal appearance. She exhibits no distension.  Genitourinary: Left adnexum displays tenderness. Left adnexum displays no mass. There is bleeding (Blood noted at the os with no clots) around the vagina. No vaginal discharge found.  Musculoskeletal: Normal range of motion.  Neurological: She is alert and oriented to person, place, and time. No cranial nerve deficit.  Skin: Skin is warm and dry. No rash noted.  Psychiatric: She has a normal mood and affect. Her behavior is normal.    ED Course  Procedures (including critical care time)  Labs Reviewed  URINALYSIS, ROUTINE W REFLEX MICROSCOPIC - Abnormal; Notable for the following:    APPearance CLOUDY (*)    Hgb urine  dipstick LARGE (*)    Protein, ur 30 (*)    Leukocytes, UA SMALL (*)    All other components within normal limits  CBC WITH DIFFERENTIAL - Abnormal; Notable for the following:    WBC 15.4 (*)    Neutro Abs 10.4 (*)    All other components within normal limits  URINE MICROSCOPIC-ADD ON - Abnormal; Notable for the following:    Squamous Epithelial / LPF FEW (*)    Bacteria, UA MANY (*)    All other components within normal limits  GC/CHLAMYDIA PROBE AMP  URINE CULTURE  HCG, QUANTITATIVE, PREGNANCY  POCT PREGNANCY, URINE   No results found.      US Pelvis Complete (Final result)  Result time: 12/22/12 16:02:17    Procedure changed from US OB Comp Less 14 Wks       Final result by Rad Results In Interface (12/22/12 16:02:17)    Narrative:   *RADIOLOGY REPORT*  Clinical Data: Vaginal bleeding. Negative urine pregnancy test.  TRANSABDOMINAL AND TRANSVAGINAL ULTRASOUND OF PELVIS Technique: Both transabdominal and transvaginal ultrasound examinations of the pelvis were performed. Transabdominal technique was performed for global imaging of the pelvis including uterus, ovaries, adnexal regions, and pelvic cul-de-sac.  It was necessary to proceed with endovaginal exam following the transabdominal exam to visualize the uterus and ovaries in better detail.  Comparison: Previous obstetrical ultrasounds, the most recent dated 09/29/2008.  Findings:  Uterus: Normal in size and appearance  Endometrium: Normal, measuring 7.1 mm in maximum thickness transvaginally.  Right ovary: Normal appearance/no adnexal mass  Left ovary: Normal appearance/no adnexal mass  Other findings: Small amount of free peritoneal fluid, within normal limits of physiological fluid.  IMPRESSION: Normal examination.     1. Menses painful       MDM  Initial urine pregnancy test done here was negative.  No definitive IUP seen by me on bedside ultrasound       Nelia Shi,  MD 12/25/12 [phone]

## 2012-12-22 NOTE — ED Notes (Signed)
RUE:[id number]<JW> Expected date:<BR> Expected time:<BR> Means of arrival:<BR> Comments:<BR> Disoriented/after leaving the court house

## 2012-12-22 NOTE — ED Notes (Signed)
Patient transported to Ultrasound 

## 2012-12-22 NOTE — Progress Notes (Signed)
[facility] has seen patient and given her a list of Primary Care Resources. Also, told her about Clinic at [facility].

## 2012-12-22 NOTE — ED Notes (Addendum)
Per pt:  Pt woke up with vaginal spotting accompanied by throbbing vaginal pain along with lower abdominal aching 9/10.  Pt also had achy joints and swollen throat (6/10). Pt is around [redacted] weeks pregnant; has not seen OB/GYN as of now.  Pt and fiancee had :vigorous" sexual intercourse several times for the past two evenings. Pt is on 71 mg Methadone daily for about 4 years. Pt denies n/v/d and headache, but is having blurry vision.  Pt denies lifting anything heavy.  Pt walks 5 miles minimum a day as they do not currently have a vehicle.  This is pt's fourth pregnancy; 1 miscarriage, 1 stillbirth and two living children.  Pt is also on Keppra for seizure activity during a period of time while pt was off Methadone.

## 2012-12-23 LAB — GC/CHLAMYDIA PROBE AMP: GC Probe RNA: NEGATIVE

## 2012-12-25 LAB — URINE CULTURE: Colony Count: >=100000

## 2012-12-26 NOTE — Progress Notes (Signed)
ED Antimicrobial Stewardship Positive Culture Follow Up   Tammy Winters is an 27 y.o. female who presented to [facility] on 12/22/2012 with a chief complaint of  Chief Complaint  Patient presents with  . Vaginal Bleeding    [redacted] weeks pregnant, but has not been to the MD; pt is on Methadone  . Vaginal Pain    throbbing  . Abdominal Pain    lower  . Joint Pain    Recent Results (from the past 720 hour(s))  URINE CULTURE     Status: None   Collection Time    12/22/12  1:46 PM      Result Value Range Status   Specimen Description URINE, CLEAN CATCH   Final   Special Requests NONE   Final   Culture  Setup Time 12/23/2012 01:13   Final   Colony Count >=100,000 COLONIES/ML   Final   Culture     Final   Value: STAPHYLOCOCCUS SPECIES (COAGULASE NEGATIVE)     Note: RIFAMPIN AND GENTAMICIN SHOULD NOT BE USED AS SINGLE DRUGS FOR TREATMENT OF STAPH INFECTIONS.   Report Status 12/25/2012 FINAL   Final   Organism ID, Bacteria STAPHYLOCOCCUS SPECIES (COAGULASE NEGATIVE)   Final  GC/CHLAMYDIA PROBE AMP     Status: None   Collection Time    12/22/12  2:20 PM      Result Value Range Status   CT Probe RNA NEGATIVE  NEGATIVE Final   GC Probe RNA NEGATIVE  NEGATIVE Final   Comment: (NOTE)                                                                                              Normal Reference Range: Negative          Assay performed using the Gen-Probe APTIMA COMBO2 (R) Assay.     Acceptable specimen types for this assay include APTIMA Swabs (Unisex,     endocervical, urethral, or vaginal), first void urine, and ThinPrep     liquid based cytology samples.     [x]  Patient discharged originally without antimicrobial agent and treatment is now indicated  New antibiotic prescription: Macrobid 100mg  po BID x 5 days  ED Provider: Raymon Mutton PA-C   Mickeal Skinner 12/26/2012, 4:07 PM Infectious Diseases Pharmacist Phone# [phone]

## 2012-12-27 ENCOUNTER — Telehealth (HOSPITAL_COMMUNITY): Payer: Self-pay | Admitting: Emergency Medicine

## 2012-12-27 NOTE — ED Notes (Signed)
Post ED Visit - Positive Culture Follow-up: Successful Patient Follow-Up  Culture assessed and recommendations reviewed by: []  Wes Dulaney, Pharm.D., BCPS [x]  Celedonio Miyamoto, Pharm.D., BCPS []  Georgina Pillion, Pharm.D., BCPS []  [place], [address].D., BCPS, AAHIVP []  Estella Husk, Pharm.D., BCPS, AAHIVP  Positive urine culture  [x]  Patient discharged without antimicrobial prescription and treatment is now indicated []  Organism is resistant to prescribed ED discharge antimicrobial []  Patient with positive blood cultures  Changes discussed with ED provider: Raymon Mutton PA-C New antibiotic prescription: Macrobid 100 mg PO BID x 5 days    Kylie A Holland 12/27/2012, 1:21 PM

## 2012-12-28 ENCOUNTER — Telehealth (HOSPITAL_COMMUNITY): Payer: Self-pay | Admitting: Emergency Medicine

## 2012-12-28 NOTE — ED Notes (Signed)
Patient called back and stated she could not afford any Rx that was not on the $4 list. Per patient, she had miscarried and is no longer pregnant. Sent chart back to [employer] PA-C who had prescribed original Rx. Left chart with PA-C who will call when finished.

## 2012-12-28 NOTE — ED Notes (Signed)
Post ED Visit - Positive Culture Follow-up: Successful Patient Follow-Up  Culture assessed and recommendations reviewed by: []  Wes Dulaney, Pharm.D., BCPS [x]  Celedonio Miyamoto, Pharm.D., BCPS []  Georgina Pillion, Pharm.D., BCPS []  [place], [address].D., BCPS, AAHIVP []  Estella Husk, Pharm.D., BCPS, AAHIVP  Positive urine culture  [x]  Patient discharged without antimicrobial prescription and treatment is now indicated []  Organism is resistant to prescribed ED discharge antimicrobial []  Patient with positive blood cultures  Changes discussed with ED provider: Raymon Mutton PA-C New antibiotic prescription Macrobid 100 mg PO BID x 5 days    Kylie A Holland 12/28/2012, 6:45 PM

## 2012-12-28 NOTE — Telephone Encounter (Signed)
Called and left message for pt to call back about lab results

## 2012-12-28 NOTE — ED Notes (Signed)
[place] PA-C still not called with corrected medication for patient.

## 2012-12-29 ENCOUNTER — Telehealth (HOSPITAL_COMMUNITY): Payer: Self-pay | Admitting: Emergency Medicine

## 2012-12-29 NOTE — ED Notes (Signed)
Patietn informed of results and wants abx for Keflex 500 mg po BID x 7 days called to [phone]

## 2013-01-25 ENCOUNTER — Encounter (HOSPITAL_COMMUNITY): Payer: Self-pay | Admitting: Emergency Medicine

## 2013-01-25 ENCOUNTER — Emergency Department (HOSPITAL_COMMUNITY)
Admission: EM | Admit: 2013-01-25 | Discharge: 2013-01-25 | Disposition: A | Discharge status: Home / Self Care | Attending: Emergency Medicine | Admitting: Emergency Medicine

## 2013-01-25 DIAGNOSIS — F19939 Other psychoactive substance use, unspecified with withdrawal, unspecified: Secondary | ICD-10-CM | POA: Insufficient documentation

## 2013-01-25 DIAGNOSIS — F1110 Opioid abuse, uncomplicated: Secondary | ICD-10-CM | POA: Insufficient documentation

## 2013-01-25 DIAGNOSIS — F17200 Nicotine dependence, unspecified, uncomplicated: Secondary | ICD-10-CM | POA: Insufficient documentation

## 2013-01-25 DIAGNOSIS — R197 Diarrhea, unspecified: Secondary | ICD-10-CM | POA: Insufficient documentation

## 2013-01-25 DIAGNOSIS — Z79899 Other long term (current) drug therapy: Secondary | ICD-10-CM | POA: Insufficient documentation

## 2013-01-25 DIAGNOSIS — F1920 Other psychoactive substance dependence, uncomplicated: Secondary | ICD-10-CM | POA: Insufficient documentation

## 2013-01-25 DIAGNOSIS — Z8659 Personal history of other mental and behavioral disorders: Secondary | ICD-10-CM | POA: Insufficient documentation

## 2013-01-25 DIAGNOSIS — R112 Nausea with vomiting, unspecified: Secondary | ICD-10-CM | POA: Insufficient documentation

## 2013-01-25 DIAGNOSIS — IMO0001 Reserved for inherently not codable concepts without codable children: Secondary | ICD-10-CM | POA: Insufficient documentation

## 2013-01-25 DIAGNOSIS — R109 Unspecified abdominal pain: Secondary | ICD-10-CM | POA: Insufficient documentation

## 2013-01-25 DIAGNOSIS — Z8619 Personal history of other infectious and parasitic diseases: Secondary | ICD-10-CM | POA: Insufficient documentation

## 2013-01-25 DIAGNOSIS — F1193 Opioid use, unspecified with withdrawal: Secondary | ICD-10-CM

## 2013-01-25 DIAGNOSIS — F1123 Opioid dependence with withdrawal: Secondary | ICD-10-CM

## 2013-01-25 LAB — URINE MICROSCOPIC-ADD ON

## 2013-01-25 LAB — CBC WITH DIFFERENTIAL/PLATELET
Basophils Absolute: 0 10*3/uL (ref 0.0–0.1)
Eosinophils Relative: 1 % (ref 0–5)
HCT: 38.3 % (ref 36.0–46.0)
Lymphocytes Relative: 21 % (ref 12–46)
Lymphs Abs: 2.3 10*3/uL (ref 0.7–4.0)
MCV: 87.8 fL (ref 78.0–100.0)
Neutro Abs: 7.8 10*3/uL — ABNORMAL HIGH (ref 1.7–7.7)
Platelets: 250 10*3/uL (ref 150–400)
RBC: 4.36 MIL/uL (ref 3.87–5.11)
RDW: 12.5 % (ref 11.5–15.5)
WBC: 10.9 10*3/uL — ABNORMAL HIGH (ref 4.0–10.5)

## 2013-01-25 LAB — URINALYSIS, ROUTINE W REFLEX MICROSCOPIC
Bilirubin Urine: NEGATIVE
Ketones, ur: 15 mg/dL — AB
Nitrite: NEGATIVE
Protein, ur: NEGATIVE mg/dL
Urobilinogen, UA: 0.2 mg/dL (ref 0.0–1.0)

## 2013-01-25 LAB — COMPREHENSIVE METABOLIC PANEL
ALT: 12 U/L (ref 0–35)
AST: 16 U/L (ref 0–37)
Alkaline Phosphatase: 76 U/L (ref 39–117)
CO2: 25 mEq/L (ref 19–32)
Calcium: 9.4 mg/dL (ref 8.4–10.5)
Chloride: 102 mEq/L (ref 96–112)
GFR calc Af Amer: >90 mL/min (ref >90)
GFR calc non Af Amer: >90 mL/min (ref >90)
Glucose, Bld: 92 mg/dL (ref 70–99)
Sodium: 136 mEq/L (ref 135–145)
Total Bilirubin: 0.4 mg/dL (ref 0.3–1.2)

## 2013-01-25 MED ORDER — PROMETHAZINE HCL 25 MG/ML IJ SOLN
25.0000 mg | Freq: Once | INTRAMUSCULAR | Status: AC
  Administered 2013-01-25: 25 mg via INTRAVENOUS
  Filled 2013-01-25: qty 1

## 2013-01-25 MED ORDER — SODIUM CHLORIDE 0.9 % IV BOLUS (SEPSIS)
1000.0000 mL | Freq: Once | INTRAVENOUS | Status: AC
  Administered 2013-01-25: 1000 mL via INTRAVENOUS

## 2013-01-25 NOTE — ED Notes (Signed)
Bed:WA20<BR> Expected date:<BR> Expected time:<BR> Means of arrival:<BR> Comments:<BR> seizure

## 2013-01-25 NOTE — ED Notes (Signed)
[facility] called to jail for seizure like activity, pt admits she is currently withdrawing from methadone Was very agitated on ems arrival.  5mg  diazepam given. 20 gauge in left AC  cbg 71. D10 infusing. bp 150/100 pulse 90 saO2 98%

## 2013-01-25 NOTE — ED Provider Notes (Signed)
History     CSN: [id number]  Arrival date & time 01/25/13  1728   First MD Initiated Contact with Patient 01/25/13 1732      No chief complaint on file.   (Consider location/radiation/quality/duration/timing/severity/associated sxs/prior treatment) HPI Comments: Patient presents to the ER from jail. Has been experiencing generalized muscle aches, cramping, nausea, vomiting and diarrhea. Patient reports that she has been on methadone for 4 years, symptoms are similar to withdrawal she has had in the past. Complains of abdominal cramping. No fever.   Past Medical History  Diagnosis Date  . Zoster   . Drug addict   . Heroin abuse   . Anxiety     Past Surgical History  Procedure Laterality Date  . Cholecystectomy    . Dilation and curettage of uterus      dental  . Dental surgery      all teeth removed    Family History  Problem Relation Age of Onset  . Diabetes Mother   . Cancer Mother   . Diabetes Father   . Cancer Father     History  Substance Use Topics  . Smoking status: Current Some Day Smoker -- 1.00 packs/day  . Smokeless tobacco: Never Used  . Alcohol Use: No    OB History   Grav Para Term Preterm Abortions TAB SAB Ect Mult Living            2      Review of Systems  Gastrointestinal: Positive for nausea, vomiting, abdominal pain and diarrhea.  Musculoskeletal: Positive for myalgias.  All other systems reviewed and are negative.    Allergies  Adhesive; Reglan; and Zofran  Home Medications   Current Outpatient Rx  Name  Route  Sig  Dispense  Refill  . acetaminophen (TYLENOL) 325 MG tablet   Oral   Take 650 mg by mouth 3 (three) times daily as needed for pain.         Marland Kitchen ibuprofen (ADVIL,MOTRIN) 600 MG tablet   Oral   Take 1 tablet (600 mg total) by mouth every 6 (six) hours as needed for pain.   30 tablet   0   . levETIRAcetam (KEPPRA) 750 MG tablet   Oral   Take 1 tablet (750 mg total) by mouth 2 (two) times daily.   60 tablet  3   . levothyroxine (SYNTHROID, LEVOTHROID) 125 MCG tablet   Oral   Take 125 mcg by mouth daily before breakfast.         . Omega-3 Fatty Acids (FISH OIL) 1000 MG CAPS   Oral   Take 2 capsules by mouth daily.         Marland Kitchen PRESCRIPTION MEDICATION      Methadone 71mg  every day           There were no vitals taken for this visit.  Physical Exam  Constitutional: She is oriented to person, place, and time. She appears well-developed and well-nourished. No distress.  HENT:  Head: Normocephalic and atraumatic.  Right Ear: Hearing normal.  Left Ear: Hearing normal.  Nose: Nose normal.  Mouth/Throat: Oropharynx is clear and moist and mucous membranes are normal.  Eyes: Conjunctivae and EOM are normal. Pupils are equal, round, and reactive to light.  Neck: Normal range of motion. Neck supple.  Cardiovascular: Regular rhythm, S1 normal and S2 normal.  Exam reveals no gallop and no friction rub.   No murmur heard. Pulmonary/Chest: Effort normal and breath sounds normal. No respiratory distress. She exhibits  no tenderness.  Abdominal: Soft. Normal appearance and bowel sounds are normal. There is no hepatosplenomegaly. There is no tenderness. There is no rebound, no guarding, no tenderness at McBurney's point and negative Murphy's sign. No hernia.  Musculoskeletal: Normal range of motion.  Neurological: She is alert and oriented to person, place, and time. She has normal strength. No cranial nerve deficit or sensory deficit. Coordination normal. GCS eye subscore is 4. GCS verbal subscore is 5. GCS motor subscore is 6.  Skin: Skin is warm, dry and intact. No rash noted. No cyanosis.  Psychiatric: She has a normal mood and affect. Her speech is normal and behavior is normal. Thought content normal.    ED Course  Procedures (including critical care time)  Labs Reviewed  CBC WITH DIFFERENTIAL - Abnormal; Notable for the following:    WBC 10.9 (*)    Neutro Abs 7.8 (*)    All other  components within normal limits  URINALYSIS, ROUTINE W REFLEX MICROSCOPIC - Abnormal; Notable for the following:    APPearance CLOUDY (*)    Hgb urine dipstick SMALL (*)    Ketones, ur 15 (*)    Leukocytes, UA TRACE (*)    All other components within normal limits  URINE MICROSCOPIC-ADD ON - Abnormal; Notable for the following:    Squamous Epithelial / LPF FEW (*)    All other components within normal limits  COMPREHENSIVE METABOLIC PANEL  LIPASE, BLOOD   No results found.   Diagnosis: 1. Methadone withdrawal 2. Pseudoseizure     MDM  Patient presents to the ER for evaluation of generalized weakness with aches and pains all over. She has had vomiting and diarrhea. Patient for methadone for 2 days. This is consistent with mild methadone withdrawal. Vital signs, however, are stable.  Patient has had multiple episodes of shaking all over while she is here in the ER. This does, however, stopped it her name is called. She then talks immediately afterwards does not have any postictal state. She has a stated history of seizures, but this is not consistent with actual seizure. Suspect malingering as the patient is currently incarcerated.        Gilda Crease, MD 01/25/13 2228

## 2013-01-26 ENCOUNTER — Inpatient Hospital Stay (HOSPITAL_COMMUNITY)

## 2013-01-26 ENCOUNTER — Other Ambulatory Visit: Payer: Self-pay

## 2013-01-26 ENCOUNTER — Encounter (HOSPITAL_COMMUNITY): Payer: Self-pay | Admitting: Vascular Surgery

## 2013-01-26 ENCOUNTER — Inpatient Hospital Stay (HOSPITAL_COMMUNITY)
Admission: EM | Admit: 2013-01-26 | Discharge: 2013-01-29 | DRG: 897 | Attending: Family Medicine | Admitting: Family Medicine

## 2013-01-26 DIAGNOSIS — G40909 Epilepsy, unspecified, not intractable, without status epilepticus: Secondary | ICD-10-CM | POA: Diagnosis present

## 2013-01-26 DIAGNOSIS — E039 Hypothyroidism, unspecified: Secondary | ICD-10-CM

## 2013-01-26 DIAGNOSIS — G40901 Epilepsy, unspecified, not intractable, with status epilepticus: Secondary | ICD-10-CM

## 2013-01-26 DIAGNOSIS — F445 Conversion disorder with seizures or convulsions: Secondary | ICD-10-CM | POA: Diagnosis present

## 2013-01-26 DIAGNOSIS — F1123 Opioid dependence with withdrawal: Secondary | ICD-10-CM

## 2013-01-26 DIAGNOSIS — F1210 Cannabis abuse, uncomplicated: Secondary | ICD-10-CM | POA: Diagnosis present

## 2013-01-26 DIAGNOSIS — F17200 Nicotine dependence, unspecified, uncomplicated: Secondary | ICD-10-CM | POA: Diagnosis present

## 2013-01-26 DIAGNOSIS — G40401 Other generalized epilepsy and epileptic syndromes, not intractable, with status epilepticus: Secondary | ICD-10-CM

## 2013-01-26 DIAGNOSIS — F411 Generalized anxiety disorder: Secondary | ICD-10-CM | POA: Diagnosis present

## 2013-01-26 DIAGNOSIS — F1193 Opioid use, unspecified with withdrawal: Secondary | ICD-10-CM

## 2013-01-26 DIAGNOSIS — E860 Dehydration: Secondary | ICD-10-CM

## 2013-01-26 DIAGNOSIS — F1120 Opioid dependence, uncomplicated: Secondary | ICD-10-CM

## 2013-01-26 DIAGNOSIS — R569 Unspecified convulsions: Secondary | ICD-10-CM

## 2013-01-26 DIAGNOSIS — F419 Anxiety disorder, unspecified: Secondary | ICD-10-CM

## 2013-01-26 DIAGNOSIS — R000 Tachycardia, unspecified: Secondary | ICD-10-CM | POA: Diagnosis present

## 2013-01-26 DIAGNOSIS — E876 Hypokalemia: Secondary | ICD-10-CM

## 2013-01-26 DIAGNOSIS — F19939 Other psychoactive substance use, unspecified with withdrawal, unspecified: Principal | ICD-10-CM | POA: Diagnosis present

## 2013-01-26 HISTORY — DX: Unspecified convulsions: R56.9

## 2013-01-26 HISTORY — DX: Personal history of other diseases of the digestive system: Z87.19

## 2013-01-26 HISTORY — DX: Low back pain, unspecified: M54.50

## 2013-01-26 HISTORY — DX: Gastro-esophageal reflux disease without esophagitis: K21.9

## 2013-01-26 HISTORY — DX: Hypothyroidism, unspecified: E03.9

## 2013-01-26 HISTORY — DX: Low back pain: M54.5

## 2013-01-26 HISTORY — DX: Other chronic pain: G89.29

## 2013-01-26 LAB — POCT I-STAT, CHEM 8
Calcium, Ion: 1.14 mmol/L (ref 1.12–1.23)
Glucose, Bld: 74 mg/dL (ref 70–99)
HCT: 38 % (ref 36.0–46.0)
Hemoglobin: 12.9 g/dL (ref 12.0–15.0)
Potassium: 3.7 mEq/L (ref 3.5–5.1)

## 2013-01-26 LAB — CBC WITH DIFFERENTIAL/PLATELET
Basophils Absolute: 0 10*3/uL (ref 0.0–0.1)
Eosinophils Relative: 0 % (ref 0–5)
Lymphocytes Relative: 24 % (ref 12–46)
Lymphs Abs: 2.5 10*3/uL (ref 0.7–4.0)
Neutro Abs: 7.4 10*3/uL (ref 1.7–7.7)
Neutrophils Relative %: 70 % (ref 43–77)
Platelets: 238 10*3/uL (ref 150–400)
RBC: 4.09 MIL/uL (ref 3.87–5.11)
RDW: 12.6 % (ref 11.5–15.5)
WBC: 10.6 10*3/uL — ABNORMAL HIGH (ref 4.0–10.5)

## 2013-01-26 LAB — URINE MICROSCOPIC-ADD ON

## 2013-01-26 LAB — URINALYSIS, ROUTINE W REFLEX MICROSCOPIC
Glucose, UA: NEGATIVE mg/dL
Ketones, ur: >80 mg/dL — AB
Leukocytes, UA: NEGATIVE
Nitrite: NEGATIVE
pH: 5.5 (ref 5.0–8.0)

## 2013-01-26 LAB — RAPID URINE DRUG SCREEN, HOSP PERFORMED
Barbiturates: NOT DETECTED
Benzodiazepines: POSITIVE — AB
Cocaine: NOT DETECTED
Tetrahydrocannabinol: POSITIVE — AB

## 2013-01-26 LAB — CBC
HCT: 34 % — ABNORMAL LOW (ref 36.0–46.0)
Hemoglobin: 12 g/dL (ref 12.0–15.0)
MCHC: 35.3 g/dL (ref 30.0–36.0)
RBC: 3.89 MIL/uL (ref 3.87–5.11)
WBC: 11.6 10*3/uL — ABNORMAL HIGH (ref 4.0–10.5)

## 2013-01-26 LAB — POCT PREGNANCY, URINE: Preg Test, Ur: NEGATIVE

## 2013-01-26 LAB — CREATININE, SERUM: Creatinine, Ser: 0.54 mg/dL (ref 0.50–1.10)

## 2013-01-26 MED ORDER — CLONIDINE HCL 0.1 MG PO TABS: 0.1000 mg | ORAL_TABLET | Freq: Every day | ORAL | Status: DC

## 2013-01-26 MED ORDER — METHOCARBAMOL 500 MG PO TABS
500.0000 mg | ORAL_TABLET | Freq: Three times a day (TID) | ORAL | Status: DC | PRN
  Filled 2013-01-26 (×2): qty 1

## 2013-01-26 MED ORDER — SODIUM CHLORIDE 0.9 % IV SOLN
INTRAVENOUS | Status: AC
  Administered 2013-01-26: 19:00:00 via INTRAVENOUS

## 2013-01-26 MED ORDER — SODIUM CHLORIDE 0.9 % IV SOLN
500.0000 mg | Freq: Once | INTRAVENOUS | Status: AC
  Administered 2013-01-26: 500 mg via INTRAVENOUS
  Filled 2013-01-26: qty 5

## 2013-01-26 MED ORDER — SODIUM CHLORIDE 0.9 % IV BOLUS (SEPSIS)
1000.0000 mL | Freq: Once | INTRAVENOUS | Status: AC
  Administered 2013-01-26: 1000 mL via INTRAVENOUS

## 2013-01-26 MED ORDER — SODIUM CHLORIDE 0.9 % IJ SOLN
3.0000 mL | Freq: Two times a day (BID) | INTRAMUSCULAR | Status: DC
  Administered 2013-01-28 – 2013-01-29 (×2): 3 mL via INTRAVENOUS

## 2013-01-26 MED ORDER — LORAZEPAM 2 MG/ML IJ SOLN
1.0000 mg | INTRAMUSCULAR | Status: DC | PRN
  Administered 2013-01-26 – 2013-01-28 (×7): 1 mg via INTRAVENOUS
  Filled 2013-01-26 (×7): qty 1

## 2013-01-26 MED ORDER — LORAZEPAM 2 MG/ML IJ SOLN
INTRAMUSCULAR | Status: AC
  Administered 2013-01-26: 2 mg via INTRAVENOUS
  Filled 2013-01-26: qty 1

## 2013-01-26 MED ORDER — CLONIDINE HCL 0.1 MG PO TABS: 0.1000 mg | ORAL_TABLET | ORAL | Status: DC

## 2013-01-26 MED ORDER — NAPROXEN 500 MG PO TABS
500.0000 mg | ORAL_TABLET | Freq: Two times a day (BID) | ORAL | Status: DC | PRN
  Filled 2013-01-26: qty 1

## 2013-01-26 MED ORDER — POLYETHYLENE GLYCOL 3350 17 G PO PACK
17.0000 g | PACK | Freq: Every day | ORAL | Status: DC | PRN
  Filled 2013-01-26: qty 1

## 2013-01-26 MED ORDER — ZOLPIDEM TARTRATE 5 MG PO TABS
5.0000 mg | ORAL_TABLET | Freq: Once | ORAL | Status: AC
  Administered 2013-01-26: 5 mg via ORAL
  Filled 2013-01-26: qty 1

## 2013-01-26 MED ORDER — LOPERAMIDE HCL 2 MG PO CAPS: 2.0000 mg | ORAL_CAPSULE | ORAL | Status: DC | PRN

## 2013-01-26 MED ORDER — SODIUM CHLORIDE 0.9 % IV SOLN
1250.0000 mg | Freq: Two times a day (BID) | INTRAVENOUS | Status: DC
  Administered 2013-01-27 – 2013-01-29 (×5): 1250 mg via INTRAVENOUS
  Filled 2013-01-26 (×6): qty 12.5

## 2013-01-26 MED ORDER — ENOXAPARIN SODIUM 40 MG/0.4ML ~~LOC~~ SOLN
40.0000 mg | SUBCUTANEOUS | Status: DC
  Filled 2013-01-26 (×4): qty 0.4

## 2013-01-26 MED ORDER — LEVOTHYROXINE SODIUM 125 MCG PO TABS
125.0000 ug | ORAL_TABLET | Freq: Every day | ORAL | Status: DC
  Administered 2013-01-27: 125 ug via ORAL
  Filled 2013-01-26 (×2): qty 1

## 2013-01-26 MED ORDER — ALUM & MAG HYDROXIDE-SIMETH 200-200-20 MG/5ML PO SUSP: 30.0000 mL | Freq: Four times a day (QID) | ORAL | Status: DC | PRN

## 2013-01-26 MED ORDER — ACETAMINOPHEN 325 MG PO TABS
650.0000 mg | ORAL_TABLET | Freq: Four times a day (QID) | ORAL | Status: DC | PRN
  Administered 2013-01-26 – 2013-01-28 (×5): 650 mg via ORAL
  Filled 2013-01-26 (×4): qty 2

## 2013-01-26 MED ORDER — ACETAMINOPHEN 650 MG RE SUPP: 650.0000 mg | Freq: Four times a day (QID) | RECTAL | Status: DC | PRN

## 2013-01-26 MED ORDER — DICYCLOMINE HCL 20 MG PO TABS
20.0000 mg | ORAL_TABLET | Freq: Four times a day (QID) | ORAL | Status: DC | PRN
  Filled 2013-01-26: qty 1

## 2013-01-26 MED ORDER — SODIUM CHLORIDE 0.9 % IV SOLN
1000.0000 mg | Freq: Once | INTRAVENOUS | Status: AC
  Administered 2013-01-26: 1000 mg via INTRAVENOUS
  Filled 2013-01-26: qty 10

## 2013-01-26 MED ORDER — OMEGA-3-ACID ETHYL ESTERS 1 G PO CAPS
2.0000 g | ORAL_CAPSULE | Freq: Every day | ORAL | Status: DC
  Administered 2013-01-27 – 2013-01-29 (×3): 2 g via ORAL
  Filled 2013-01-26 (×4): qty 2

## 2013-01-26 MED ORDER — BISACODYL 10 MG RE SUPP: 10.0000 mg | Freq: Every day | RECTAL | Status: DC | PRN

## 2013-01-26 MED ORDER — FISH OIL 1000 MG PO CAPS: 2.0000 | ORAL_CAPSULE | Freq: Every day | ORAL | Status: DC

## 2013-01-26 MED ORDER — MAGNESIUM CITRATE PO SOLN
1.0000 | Freq: Once | ORAL | Status: AC | PRN
  Filled 2013-01-26: qty 296

## 2013-01-26 MED ORDER — CLONIDINE HCL 0.1 MG PO TABS
0.1000 mg | ORAL_TABLET | Freq: Four times a day (QID) | ORAL | Status: DC
  Administered 2013-01-26 – 2013-01-28 (×7): 0.1 mg via ORAL
  Filled 2013-01-26 (×10): qty 1

## 2013-01-26 MED ORDER — ONDANSETRON 4 MG PO TBDP
4.0000 mg | ORAL_TABLET | Freq: Four times a day (QID) | ORAL | Status: DC | PRN
  Filled 2013-01-26 (×2): qty 1

## 2013-01-26 MED ORDER — LORAZEPAM 2 MG/ML IJ SOLN: 2.0000 mg | Freq: Once | INTRAMUSCULAR | Status: AC

## 2013-01-26 MED ORDER — HYDROXYZINE HCL 25 MG PO TABS
25.0000 mg | ORAL_TABLET | Freq: Four times a day (QID) | ORAL | Status: DC | PRN
  Administered 2013-01-27 – 2013-01-28 (×2): 25 mg via ORAL
  Filled 2013-01-26 (×3): qty 1
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
## 2013-01-26 NOTE — ED Notes (Addendum)
Admitting MD at bedside. Also aware of seizure activity lasting approx 30 seconds. No post ictal phase noted. Answering MD's questions appropriately. No incontinence or oral trauma noted

## 2013-01-26 NOTE — ED Notes (Addendum)
Pt reports to the ED for eval of multiple witnessed seizures. Pt has hx of same but unsure if pt has taken her prescribed Keppra. Pt has tonic clonic seizures. Pt received 10 mg of Valium PO and 2 mg of Ativan IM prior to EMS arrival. Pt received 2.5 mg of Versed by IV en route. CBG 91 mg/dl. Pt post ictal at this time. No incontinence or oral trauma noted. Pt lethargic but arousable to verbal stimuli. Pt reports her head feels "funny." Pt was seen yesterday for the same. Pt is taking methadone for detox.

## 2013-01-26 NOTE — ED Notes (Signed)
Patient transported to CT 

## 2013-01-26 NOTE — Progress Notes (Signed)
Received from ed accompanied by RN and guards from [employer], swabbed for MRSA, IVF started at 100cc/hr per order, telemetry applied, Vitals obtained, assessment of skin noted, pt c/o generalized pain and wants something to help her "chill", Berle Mull RN

## 2013-01-26 NOTE — Consult Note (Addendum)
Reason for Consult:Seizures Referring Physician: Hyacinth Meeker  CC: Seizures  HPI: Tammy Winters is an 27 y.o. female with a history of seizures.  Was admitted to this facility in April of this year with seizures that were felt to be related to Methadone withdrawal.  Patient was incarcerated at that time and her Methadone was not continued. The patient reports that once released she got back in to the Methadone clinic and they restarted her Methadone and began a taper.  Once she got below 70mg  she had more seizures.  She was held at 70mg .  She has continued her Methadone until recently when incarcerated again.  She was jailed on 6/20.  She has been maintained on her Keppra.  This morning she had multiple witnessed t/c seizures.  At her facility she was given 10mg  of Valium.  Seizures were recurrent and she was given 2mg  of Ativan IM.  EMS was called and when they arrived, they gave 2.5mg  of Versed.  The patient had one further seizure in the ED and without a post-ictal period.    Past Medical History  Diagnosis Date  . Zoster   . Drug addict   . Heroin abuse   . Anxiety   . Seizures     Past Surgical History  Procedure Laterality Date  . Cholecystectomy    . Dilation and curettage of uterus      dental  . Dental surgery      all teeth removed    Family History  Problem Relation Age of Onset  . Diabetes Mother   . Cancer Mother   . Diabetes Father   . Cancer Father     Social History:  reports that she has been smoking.  She has never used smokeless tobacco. She reports that she uses illicit drugs (Marijuana). She reports that she does not drink alcohol.  Allergies  Allergen Reactions  . Adhesive (Tape) Other (See Comments)    redness, burning, skin tears  . Reglan (Metoclopramide) Other (See Comments)    body twitching   . Zofran (Ondansetron Hcl) Other (See Comments)    Body twitching    Medications: I have reviewed the patient's current medications. Prior to Admission:   Current outpatient prescriptions:acetaminophen (TYLENOL) 325 MG tablet, Take 650 mg by mouth 3 (three) times daily as needed for pain., Disp: , Rfl: ;  ibuprofen (ADVIL,MOTRIN) 600 MG tablet, Take 1 tablet (600 mg total) by mouth every 6 (six) hours as needed for pain., Disp: 30 tablet, Rfl: 0;  levETIRAcetam (KEPPRA) 1000 MG tablet, Take 1,000 mg by mouth 2 (two) times daily., Disp: , Rfl:  levothyroxine (SYNTHROID, LEVOTHROID) 125 MCG tablet, Take 125 mcg by mouth daily before breakfast., Disp: , Rfl: ;  Omega-3 Fatty Acids (FISH OIL) 1000 MG CAPS, Take 2 capsules by mouth daily., Disp: , Rfl: ;  PRESCRIPTION MEDICATION, Methadone 71mg  every day, Disp: , Rfl:   ROS: History obtained from the patient  General ROS: negative for - chills, fatigue, fever, night sweats, weight gain or weight loss Psychological ROS: negative for - behavioral disorder, hallucinations, memory difficulties, mood swings or suicidal ideation Ophthalmic ROS: negative for - blurry vision, double vision, eye pain or loss of vision ENT ROS: negative for - epistaxis, nasal discharge, oral lesions, sore throat, tinnitus or vertigo Allergy and Immunology ROS: negative for - hives or itchy/watery eyes Hematological and Lymphatic ROS: negative for - bleeding problems, bruising or swollen lymph nodes Endocrine ROS: negative for - galactorrhea, hair pattern changes,  polydipsia/polyuria or temperature intolerance Respiratory ROS: negative for - cough, hemoptysis, shortness of breath or wheezing Cardiovascular ROS: negative for - chest pain, dyspnea on exertion, edema or irregular heartbeat Gastrointestinal ROS: negative for - abdominal pain, diarrhea, hematemesis, nausea/vomiting or stool incontinence Genito-Urinary ROS: negative for - dysuria, hematuria, incontinence or urinary frequency/urgency Musculoskeletal ROS: negative for - joint swelling or muscular weakness Neurological ROS: as noted in HPI Dermatological ROS: skin  red  Physical Examination: Blood pressure 104/60, pulse 73, temperature 98.5 F (36.9 C), temperature source Oral, resp. rate 17, SpO2 100.00%.  Neurologic Examination Mental Status: Lethargic, oriented, thought content appropriate.  Speech fluent without evidence of aphasia.  Able to follow 3 step commands without difficulty. Cranial Nerves: II: Discs flat bilaterally; Visual fields grossly normal, pupils equal, round, reactive to light and accommodation III,IV, VI: ptosis not present, extra-ocular motions intact bilaterally V,VII: smile symmetric, facial light touch sensation normal bilaterally VIII: hearing normal bilaterally IX,X: gag reflex present XI: bilateral shoulder shrug XII: midline tongue extension Motor: Right : Upper extremity   5/5    Left:     Upper extremity   5/5  Lower extremity   5/5     Lower extremity   5/5 Tone and bulk:normal tone throughout; no atrophy noted Sensory: Responds to noxious stimuli throughout Deep Tendon Reflexes: 2+ and symmetric throughout Plantars: Right: downgoing   Left: downgoing Cerebellar: normal finger-to-nose Gait: Unable to test CV: pulses palpable throughout    Laboratory Studies:   Basic Metabolic Panel:  Recent Labs Lab 01/25/13 1822 01/26/13 1450 01/26/13 1605  NA 136 142  --   K 3.8 3.7  --   CL 102 109  --   CO2 25  --   --   GLUCOSE 92 74  --   BUN 11 6  --   CREATININE 0.58 0.60  --   CALCIUM 9.4  --   --   MG  --   --  1.8    Liver Function Tests:  Recent Labs Lab 01/25/13 1822  AST 16  ALT 12  ALKPHOS 76  BILITOT 0.4  PROT 7.4  ALBUMIN 3.7    Recent Labs Lab 01/25/13 1822  LIPASE 20   No results found for this basename: AMMONIA,  in the last 168 hours  CBC:  Recent Labs Lab 01/25/13 1822 01/26/13 1438 01/26/13 1450  WBC 10.9* 10.6*  --   NEUTROABS 7.8* 7.4  --   HGB 13.0 12.7 12.9  HCT 38.3 36.0 38.0  MCV 87.8 88.0  --   PLT 250 238  --     Cardiac Enzymes: No results  found for this basename: CKTOTAL, CKMB, CKMBINDEX, TROPONINI,  in the last 168 hours  BNP: No components found with this basename: POCBNP,   CBG: No results found for this basename: GLUCAP,  in the last 168 hours  Microbiology: Results for orders placed during the hospital encounter of 12/22/12  URINE CULTURE     Status: None   Collection Time    12/22/12  1:46 PM      Result Value Range Status   Specimen Description URINE, CLEAN CATCH   Final   Special Requests NONE   Final   Culture  Setup Time 12/23/2012 01:13   Final   Colony Count >=100,000 COLONIES/ML   Final   Culture     Final   Value: STAPHYLOCOCCUS SPECIES (COAGULASE NEGATIVE)     Note: RIFAMPIN AND GENTAMICIN SHOULD NOT BE USED AS SINGLE DRUGS  FOR TREATMENT OF STAPH INFECTIONS.   Report Status 12/25/2012 FINAL   Final   Organism ID, Bacteria STAPHYLOCOCCUS SPECIES (COAGULASE NEGATIVE)   Final  GC/CHLAMYDIA PROBE AMP     Status: None   Collection Time    12/22/12  2:20 PM      Result Value Range Status   CT Probe RNA NEGATIVE  NEGATIVE Final   GC Probe RNA NEGATIVE  NEGATIVE Final   Comment: (NOTE)                                                                                              Normal Reference Range: Negative          Assay performed using the Gen-Probe APTIMA COMBO2 (R) Assay.     Acceptable specimen types for this assay include APTIMA Swabs (Unisex,     endocervical, urethral, or vaginal), first void urine, and ThinPrep     liquid based cytology samples.    Coagulation Studies: No results found for this basename: LABPROT, INR,  in the last 72 hours  Urinalysis:  Recent Labs Lab 01/25/13 1909 01/26/13 1417  COLORURINE YELLOW YELLOW  LABSPEC 1.019 1.025  PHURINE 5.0 5.5  GLUCOSEU NEGATIVE NEGATIVE  HGBUR SMALL* MODERATE*  BILIRUBINUR NEGATIVE NEGATIVE  KETONESUR 15* >80*  PROTEINUR NEGATIVE NEGATIVE  UROBILINOGEN 0.2 0.2  NITRITE NEGATIVE NEGATIVE  LEUKOCYTESUR TRACE* NEGATIVE    Lipid  Panel:  No results found for this basename: chol, trig, hdl, cholhdl, vldl, ldlcalc    HgbA1C:  No results found for this basename: HGBA1C    Urine Drug Screen:     Component Value Date/Time   LABOPIA NONE DETECTED 11/16/2012 0113   COCAINSCRNUR NONE DETECTED 11/16/2012 0113   LABBENZ POSITIVE* 11/16/2012 0113   AMPHETMU NONE DETECTED 11/16/2012 0113   THCU POSITIVE* 11/16/2012 0113   LABBARB NONE DETECTED 11/16/2012 0113    Alcohol Level: No results found for this basename: ETH,  in the last 168 hours  Imaging: No results found.   Assessment/Plan: 27 year old female presenting with seizure activity.  Activity may very well be related to Methadone withdrawal.  If patient is not physically addicted to Methadone, she is clearly psychologically addicted.  Patient is on Keppra as an outpatient.  Examination nonfocal.  Recommendations: 1.  Patient has been given 1000mg  Keppra IV.  Would give another 500mg  and increase maintenance to 1250mg  q12 hours.  May change maintenance ot po once medically reasonable.  2.  Patient will need reinitiation of her Methadone or initiation of a Clonidine protocol 3.  Head CT without contrast 4.  Seizure precautions  Thana Farr, MD [facility] [phone] 01/26/2013, 4:51 PM

## 2013-01-26 NOTE — ED Notes (Signed)
Full body seizure lasting approx 45 seconds with Dr. Hyacinth Meeker present. No post ictal period noted, able to answer all questions appropriately immediately afterwards. No oral trauma noted or incontinence.
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
## 2013-01-26 NOTE — ED Provider Notes (Addendum)
History     CSN: [id number]  Arrival date & time 01/26/13  1259   First MD Initiated Contact with Patient 01/26/13 1305      Chief Complaint  Patient presents with  . Seizures    (Consider location/radiation/quality/duration/timing/severity/associated sxs/prior treatment) HPI Comments: Level V caveat applies secondary to altered mental status and seizures  The patient is a 27 year old female with a history of seizures who takes Keppra for her seizures. She presents after having multiple witnessed tonic-clonic seizures this morning. The first were tonoclonic, occurred while she was in jail or she currently resides, she was given 10 mg of oral Valium. She then had a second witnessed seizure which was treated with intramuscular Ativan 2 mg. When the paramedics arrived they also witnessed another seizure and gave 2-1/2 mg of IV Versed. The patient has been persistently postictal, has not returned to her baseline throughout the seizures. There is no other history that is available at this time, the patient is nonverbal at this time due to her postictal state and is unable to explain the circumstances of the day.  Patient is a 27 y.o. female presenting with seizures. The history is provided by the EMS personnel and medical records.  Seizures   Past Medical History  Diagnosis Date  . Zoster   . Drug addict   . Heroin abuse   . Anxiety   . Seizures     Past Surgical History  Procedure Laterality Date  . Cholecystectomy    . Dilation and curettage of uterus      dental  . Dental surgery      all teeth removed    Family History  Problem Relation Age of Onset  . Diabetes Mother   . Cancer Mother   . Diabetes Father   . Cancer Father     History  Substance Use Topics  . Smoking status: Current Some Day Smoker -- 1.00 packs/day  . Smokeless tobacco: Never Used  . Alcohol Use: No    OB History   Grav Para Term Preterm Abortions TAB SAB Ect Mult Living            2       Review of Systems  Unable to perform ROS: Mental status change  Neurological: Positive for seizures.    Allergies  Adhesive; Reglan; and Zofran  Home Medications   Current Outpatient Rx  Name  Route  Sig  Dispense  Refill  . acetaminophen (TYLENOL) 325 MG tablet   Oral   Take 650 mg by mouth 3 (three) times daily as needed for pain.         Marland Kitchen ibuprofen (ADVIL,MOTRIN) 600 MG tablet   Oral   Take 1 tablet (600 mg total) by mouth every 6 (six) hours as needed for pain.   30 tablet   0   . levETIRAcetam (KEPPRA) 1000 MG tablet   Oral   Take 1,000 mg by mouth 2 (two) times daily.         Marland Kitchen levothyroxine (SYNTHROID, LEVOTHROID) 125 MCG tablet   Oral   Take 125 mcg by mouth daily before breakfast.         . Omega-3 Fatty Acids (FISH OIL) 1000 MG CAPS   Oral   Take 2 capsules by mouth daily.         Marland Kitchen PRESCRIPTION MEDICATION      Methadone 71mg  every day           BP 103/63  Pulse 50  Temp(Src) 98.5 F (36.9 C) (Oral)  Resp 22  SpO2 100%  Physical Exam  Nursing note and vitals reviewed. Constitutional: She appears well-developed and well-nourished.  Somnolent, arousable to painful stimuli  HENT:  Head: Normocephalic and atraumatic.  Mouth/Throat: Oropharynx is clear and moist. No oropharyngeal exudate.  No obvious signs of trauma to the tongue  Eyes: Conjunctivae and EOM are normal. Pupils are equal, round, and reactive to light. Right eye exhibits no discharge. Left eye exhibits no discharge. No scleral icterus.  Neck: Normal range of motion. Neck supple. No JVD present. No thyromegaly present.  Cardiovascular: Normal rate, regular rhythm, normal heart sounds and intact distal pulses.  Exam reveals no gallop and no friction rub.   No murmur heard. Pulmonary/Chest: Effort normal and breath sounds normal. No respiratory distress. She has no wheezes. She has no rales.  Abdominal: Soft. Bowel sounds are normal. She exhibits no distension and no mass.  There is no tenderness.  Musculoskeletal: Normal range of motion. She exhibits no edema and no tenderness.  Lymphadenopathy:    She has no cervical adenopathy.  Neurological:  Somnolent, arousable to painful stimuli, able to open eyes, looks from left to right, right to left, moves all extremities x4 in withdrawal to painful stimuli. Nonverbal  Skin: Skin is warm and dry. No rash noted. No erythema.  Psychiatric: She has a normal mood and affect. Her behavior is normal.    ED Course  Procedures (including critical care time)  Labs Reviewed  CBC WITH DIFFERENTIAL - Abnormal; Notable for the following:    WBC 10.6 (*)    All other components within normal limits  URINALYSIS, ROUTINE W REFLEX MICROSCOPIC - Abnormal; Notable for the following:    Hgb urine dipstick MODERATE (*)    Ketones, ur >80 (*)    All other components within normal limits  URINE MICROSCOPIC-ADD ON - Abnormal; Notable for the following:    Bacteria, UA MANY (*)    All other components within normal limits  MAGNESIUM  URINE RAPID DRUG SCREEN (HOSP PERFORMED)  POCT PREGNANCY, URINE  POCT I-STAT, CHEM 8   No results found.   1. Status epilepticus   2. Dehydration       MDM  Other than having generalized altered mental status with her somnolence, there does not appear to be any other focal neurologic deficit. The patient is currently in jail, she is unsure she is taking her medications including Keppra, she denies any other symptoms prior to her arrival at this time she is unable to give any history whatsoever. She will be placed on a cardiac monitor, frequent neurologic checks, she has been given multiple doses of benzodiazepines and at this time is not seizing. I will start her back on intravenous Keppra, Ativan as needed, likely needs to be admitted for status epilepticus.   ED ECG REPORT  I personally interpreted this EKG   Date: 01/26/2013   Rate: 71  Rhythm: normal sinus rhythm  QRS Axis: normal   Intervals: normal  ST/T Wave abnormalities: normal  Conduction Disutrbances:none  Narrative Interpretation:   Old EKG Reviewed: none available   D/w Dr. Thad Ranger of Neurology who will see pt in consultation.  She has not had any further seizures since being given Ativan and Keppra.  She has no focal findings other than dehydration on lab w/u.  CBC and BMP without findings.  Not pregnant, ketonuria present  D/w Dr. Janee Morn with [facility] - he will admit for further evaluation -  to the step down unit.  The pt is critically ill in status epilepticus though she had now stopped seizing and is peristently somnolent and post ictal.  Critical care provided including multiple consultations with specialists, multiple doses of anti seizure meds including keppra and ativan, multiple neuro checks with gradual improvement.  The pt is protecting her airway.  Pt had another seizure shortly after talking to hospitalist - 1mg  of ativan given.  CRITICAL CARE Performed by: Vida Roller Total critical care time: 35 Critical care time was exclusive of separately billable procedures and treating other patients. Critical care was necessary to treat or prevent imminent or life-threatening deterioration. Critical care was time spent personally by me on the following activities: development of treatment plan with patient and/or surrogate as well as nursing, discussions with consultants, evaluation of patient's response to treatment, examination of patient, obtaining history from patient or surrogate, ordering and performing treatments and interventions, ordering and review of laboratory studies, ordering and review of radiographic studies, pulse oximetry and re-evaluation of patient's condition.   Vida Roller, MD 01/26/13 1607  Vida Roller, MD 01/26/13 [phone]

## 2013-01-27 DIAGNOSIS — R569 Unspecified convulsions: Secondary | ICD-10-CM

## 2013-01-27 DIAGNOSIS — E876 Hypokalemia: Secondary | ICD-10-CM | POA: Clinically undetermined

## 2013-01-27 LAB — COMPREHENSIVE METABOLIC PANEL
ALT: 11 U/L (ref 0–35)
AST: 17 U/L (ref 0–37)
Alkaline Phosphatase: 64 U/L (ref 39–117)
CO2: 22 mEq/L (ref 19–32)
Calcium: 8.6 mg/dL (ref 8.4–10.5)
GFR calc Af Amer: >90 mL/min (ref >90)
GFR calc non Af Amer: >90 mL/min (ref >90)
Glucose, Bld: 78 mg/dL (ref 70–99)
Potassium: 3.4 mEq/L — ABNORMAL LOW (ref 3.5–5.1)
Sodium: 139 mEq/L (ref 135–145)

## 2013-01-27 LAB — CBC
HCT: 35 % — ABNORMAL LOW (ref 36.0–46.0)
Hemoglobin: 12.2 g/dL (ref 12.0–15.0)
MCH: 30.7 pg (ref 26.0–34.0)
MCHC: 34.9 g/dL (ref 30.0–36.0)
RDW: 12.7 % (ref 11.5–15.5)

## 2013-01-27 LAB — TSH: TSH: 5.078 u[IU]/mL — ABNORMAL HIGH (ref 0.350–4.500)

## 2013-01-27 LAB — PROLACTIN: Prolactin: 8.7 ng/mL

## 2013-01-27 MED ORDER — LEVOTHYROXINE SODIUM 137 MCG PO TABS
137.0000 ug | ORAL_TABLET | Freq: Every day | ORAL | Status: DC
  Administered 2013-01-28 – 2013-01-29 (×2): 137 ug via ORAL
  Filled 2013-01-27 (×3): qty 1

## 2013-01-27 MED ORDER — POTASSIUM CHLORIDE CRYS ER 20 MEQ PO TBCR
40.0000 meq | EXTENDED_RELEASE_TABLET | Freq: Once | ORAL | Status: AC
  Administered 2013-01-27: 40 meq via ORAL
  Filled 2013-01-27: qty 2

## 2013-01-27 MED ORDER — SODIUM CHLORIDE 0.9 % IV SOLN
INTRAVENOUS | Status: AC
  Administered 2013-01-27 – 2013-01-28 (×2): via INTRAVENOUS

## 2013-01-27 MED ORDER — METHADONE HCL 10 MG PO TABS
75.0000 mg | ORAL_TABLET | Freq: Every day | ORAL | Status: DC
  Administered 2013-01-27 – 2013-01-28 (×2): 75 mg via ORAL
  Filled 2013-01-27 (×2): qty 8

## 2013-01-27 MED ORDER — METHADONE HCL 5 MG/5ML PO SOLN: 76.0000 mg | Freq: Every day | ORAL | Status: DC

## 2013-01-27 MED ORDER — ZOLPIDEM TARTRATE 5 MG PO TABS
5.0000 mg | ORAL_TABLET | Freq: Once | ORAL | Status: AC
  Administered 2013-01-27: 5 mg via ORAL
  Filled 2013-01-27: qty 1

## 2013-01-27 MED ORDER — BOOST / RESOURCE BREEZE PO LIQD
1.0000 | Freq: Three times a day (TID) | ORAL | Status: DC
  Administered 2013-01-27 – 2013-01-29 (×5): 1 via ORAL

## 2013-01-27 NOTE — Progress Notes (Addendum)
Subjective: Patient anxious this morning.  No further seizure-like activity overnight.  Requesting Methadone.  Does not want to eat.    Objective: Current vital signs: BP 106/59  Pulse 48  Temp(Src) 98.3 F (36.8 C) (Oral)  Resp 18  Ht 5\' 3"  (1.6 m)  Wt 68.992 kg (152 lb 1.6 oz)  BMI 26.95 kg/m2  SpO2 100%  LMP 01/19/2013 Vital signs in last 24 hours: Temp:  [98.2 F (36.8 C)-99.2 F (37.3 C)] 98.3 F (36.8 C) (06/24 0520) Pulse Rate:  [48-105] 48 (06/24 0520) Resp:  [14-22] 18 (06/24 0520) BP: (103-123)/(59-76) 106/59 mmHg (06/24 0520) SpO2:  [100 %] 100 % (06/24 0520) Weight:  [68.266 kg (150 lb 8 oz)-68.992 kg (152 lb 1.6 oz)] 68.992 kg (152 lb 1.6 oz) (06/24 0520)  Intake/Output from previous day: 06/23 0701 - 06/24 0700 In: 1200 [I.V.:1200] Out: -  Intake/Output this shift:   Nutritional status: Clear Liquid  Neurologic Exam: Mental Status:  Lethargic, oriented, thought content appropriate. Speech fluent without evidence of aphasia. Able to follow 3 step commands without difficulty.  Cranial Nerves:  II: Discs flat bilaterally; Visual fields grossly normal, pupils equal, round, reactive to light and accommodation  III,IV, VI: ptosis not present, extra-ocular motions intact bilaterally  V,VII: smile symmetric, facial light touch sensation normal bilaterally  VIII: hearing normal bilaterally  IX,X: gag reflex present  XI: bilateral shoulder shrug  XII: midline tongue extension  Motor:  Right : Upper extremity 5/5          Left: Upper extremity 5/5   Lower extremity 5/5       Lower extremity 5/5  Tone and bulk:normal tone throughout; no atrophy noted  Sensory: Responds to noxious stimuli throughout  Deep Tendon Reflexes: 2+ and symmetric throughout  Plantars:  Right: downgoing Left: downgoing  Cerebellar:  normal finger-to-nose   Lab Results: Basic Metabolic Panel:  Recent Labs Lab 01/25/13 1822 01/26/13 1450 01/26/13 1605 01/26/13 1954  01/27/13 0415  NA 136 142  --   --  139  K 3.8 3.7  --   --  3.4*  CL 102 109  --   --  108  CO2 25  --   --   --  22  GLUCOSE 92 74  --   --  78  BUN 11 6  --   --  6  CREATININE 0.58 0.60  --  0.54 0.57  CALCIUM 9.4  --   --   --  8.6  MG  --   --  1.8  --   --     Liver Function Tests:  Recent Labs Lab 01/25/13 1822 01/27/13 0415  AST 16 17  ALT 12 11  ALKPHOS 76 64  BILITOT 0.4 0.6  PROT 7.4 6.1  ALBUMIN 3.7 3.1*    Recent Labs Lab 01/25/13 1822  LIPASE 20   No results found for this basename: AMMONIA,  in the last 168 hours  CBC:  Recent Labs Lab 01/25/13 1822 01/26/13 1438 01/26/13 1450 01/26/13 1954 01/27/13 0415  WBC 10.9* 10.6*  --  11.6* 9.1  NEUTROABS 7.8* 7.4  --   --   --   HGB 13.0 12.7 12.9 12.0 12.2  HCT 38.3 36.0 38.0 34.0* 35.0*  MCV 87.8 88.0  --  87.4 87.9  PLT 250 238  --  228 221    Cardiac Enzymes: No results found for this basename: CKTOTAL, CKMB, CKMBINDEX, TROPONINI,  in the last 168 hours  Lipid  Panel: No results found for this basename: CHOL, TRIG, HDL, CHOLHDL, VLDL, LDLCALC,  in the last 168 hours  CBG: No results found for this basename: GLUCAP,  in the last 168 hours  Microbiology: Results for orders placed during the hospital encounter of 01/26/13  MRSA PCR SCREENING     Status: None   Collection Time    01/26/13  7:06 PM      Result Value Range Status   MRSA by PCR NEGATIVE  NEGATIVE Final   Comment:            The GeneXpert MRSA Assay (FDA     approved for NASAL specimens     only), is one component of a     comprehensive MRSA colonization     surveillance program. It is not     intended to diagnose MRSA     infection nor to guide or     monitor treatment for     MRSA infections.    Coagulation Studies: No results found for this basename: LABPROT, INR,  in the last 72 hours  Imaging: Ct Head Wo Contrast  01/26/2013   *RADIOLOGY REPORT*  Clinical Data:  Altered mental status  CT HEAD WITHOUT CONTRAST   Technique:  Contiguous axial images were obtained from the base of the skull through the vertex without contrast  Comparison:  CT 11/16/2012  Findings:  The brain has a normal appearance without evidence for hemorrhage, acute infarction, hydrocephalus, or mass lesion.  There is no extra axial fluid collection.  The skull and paranasal sinuses are normal. Image quality degraded by motion.  Multiple images were repeated secondary to motion.  IMPRESSION: Normal CT of the head without contrast.   Original Report Authenticated By: Janeece Riggers, M.D.   Portable Chest 1 View  01/26/2013   *RADIOLOGY REPORT*  Clinical Data: Altered mental status.  Diffuse body aches.  PORTABLE CHEST - 1 VIEW  Comparison: 11/16/2012.  Findings: 1906 hours.  Interval extubation and removal of the nasogastric tube.  Heart size and mediastinal contours are stable. The lungs are clear.  There is no pleural effusion or pneumothorax. There is a mild thoracolumbar scoliosis which may be positional. Multiple telemetry leads overlie the chest.  IMPRESSION: No active cardiopulmonary process demonstrated.   Original Report Authenticated By: Carey Bullocks, M.D.    Medications:  I have reviewed the patient's current medications. Scheduled: . cloNIDine  0.1 mg Oral QID   Followed by  . [START ON 01/29/2013] cloNIDine  0.1 mg Oral BH-qamhs   Followed by  . [START ON 01/31/2013] cloNIDine  0.1 mg Oral QAC breakfast  . enoxaparin (LOVENOX) injection  40 mg Subcutaneous Q24H  . levETIRAcetam  1,250 mg Intravenous Q12H  . [START ON 01/28/2013] levothyroxine  137 mcg Oral QAC breakfast  . methadone  76 mg Oral Daily  . omega-3 acid ethyl esters  2 g Oral Daily  . sodium chloride  3 mL Intravenous Q12H    Assessment/Plan: 27 year old female with symptoms suggestive of Methadone withdrawal.  Has been placed on a withdrawal protocol to help ameliorate symptoms.  Remains on Keppra at 1250mg  BID.  CT of head reviewed and unremarkable.     Recommendations: 1.  Continue Keppra at current dose. 2.  Continue precautions  Case discussed with Dr. Janee Morn   LOS: 1 day   Thana Farr, MD [facility] [phone] 01/27/2013  11:29 AM

## 2013-01-27 NOTE — Progress Notes (Addendum)
INITIAL NUTRITION ASSESSMENT  DOCUMENTATION CODES Per approved criteria  -Not Applicable   INTERVENTION:  Resource Breeze 3 times daily with meals (250 kcals, 9 gm protein per 8 fl oz carton) RD to follow for nutrition care plan  NUTRITION DIAGNOSIS: Inadequate oral intake related to poor appetite as evidenced by patient report  Goal: Oral intake with meals & supplements to meet >/= 90% of estimated nutrition needs  Monitor:  PO & supplemental intake, weight, labs, I/O's  Reason for Assessment: Malnutrition Screening Tool Report  27 y.o. female  Admitting Dx: Seizure  ASSESSMENT: Patient with a history of seizures; was admitted in April of this year with seizures that were felt to be related to Methadone withdrawal; patient was incarcerated on 6/20; morning of 6/23 had multiple witnessed t/c seizures.  Patient reports her appetite is poor; states she feels she's lost approximately 10 lbs in the past 2 weeks; at nutrition risk given drug abuse hx, questionable weight loss; agreeable to Resource Breeze supplements; RD brought supplement to patient at time of visit per request; will also place order.  Height: Ht Readings from Last 1 Encounters:  01/26/13 5\' 3"  (1.6 m)    Weight: Wt Readings from Last 1 Encounters:  01/27/13 152 lb 1.6 oz (68.992 kg)    Ideal Body Weight: 115 lb  % Ideal Body Weight: 132%  Wt Readings from Last 10 Encounters:  01/27/13 152 lb 1.6 oz (68.992 kg)  12/22/12 152 lb (68.947 kg)  11/18/12 153 lb 7 oz (69.6 kg)    Usual Body Weight: 152 lb  % Usual Body Weight: 100%  BMI:  Body mass index is 26.95 kg/(m^2).  Estimated Nutritional Needs: Kcal: 1700-1900 Protein: 80-90 gm Fluid: 1.7-1.9 L  Skin: Intact  Diet Order: Clear Liquid  EDUCATION NEEDS: -No education needs identified at this time   Intake/Output Summary (Last 24 hours) at 01/27/13 1443 Last data filed at 01/26/13 1507  Gross per 24 hour  Intake   1200 ml  Output       0 ml  Net   1200 ml    Labs:   Recent Labs Lab 01/25/13 1822 01/26/13 1450 01/26/13 1605 01/26/13 1954 01/27/13 0415  NA 136 142  --   --  139  K 3.8 3.7  --   --  3.4*  CL 102 109  --   --  108  CO2 25  --   --   --  22  BUN 11 6  --   --  6  CREATININE 0.58 0.60  --  0.54 0.57  CALCIUM 9.4  --   --   --  8.6  MG  --   --  1.8  --   --   GLUCOSE 92 74  --   --  78    Scheduled Meds: . cloNIDine  0.1 mg Oral QID   Followed by  . [START ON 01/29/2013] cloNIDine  0.1 mg Oral BH-qamhs   Followed by  . [START ON 01/31/2013] cloNIDine  0.1 mg Oral QAC breakfast  . enoxaparin (LOVENOX) injection  40 mg Subcutaneous Q24H  . levETIRAcetam  1,250 mg Intravenous Q12H  . [START ON 01/28/2013] levothyroxine  137 mcg Oral QAC breakfast  . methadone  75 mg Oral Daily  . omega-3 acid ethyl esters  2 g Oral Daily  . sodium chloride  3 mL Intravenous Q12H    Continuous Infusions: . sodium chloride 125 mL/hr at 01/27/13 0900    Past Medical  History  Diagnosis Date  . Zoster   . Drug addict   . Heroin abuse   . Anxiety   . Hypothyroidism 01/26/2013  . Chest pain at rest   . Pneumonia     "twice" (01/26/2013)  . Chronic bronchitis     "get it q yr" (01/26/2013)  . Shortness of breath     "always; a little bit" (01/26/2013)  . Type I diabetes mellitus   . GERD (gastroesophageal reflux disease)   . History of stomach ulcers   . Seizures     "today was my second" (01/26/2013)  . Arthritis     "lower spine" (01/26/2013)  . Chronic lower back pain     Past Surgical History  Procedure Laterality Date  . Cholecystectomy    . Dilation and curettage of uterus      "I've had 3" (01/26/2013)  . Multiple tooth extractions      "I've had all my teeth removed" (01/26/2013)    Maureen Chatters, RD, LDN Pager #: [phone] After-Hours Pager #: [phone]

## 2013-01-27 NOTE — Progress Notes (Signed)
[facility] PROGRESS NOTE  Tammy Winters WUJ:[id number] DOB: 06-17-1986 DOA: 01/26/2013 PCP: No PCP Per Patient  Assessment/Plan: #1 seizures versus pseudoseizures versus methadone withdrawal Seizures likely secondary to methadone withdrawal. Patient's last methadone dose was on 01/23/2013 at 76 mg daily per methadone clinic. Patient with no seizures overnight per nursing. Patient asking for methadone to be resumed since yesterday night. Patient not interested in detox. Patient was started on a clonidine withdrawal protocol. Will start patient back on methadone 76 mg daily. May discontinue clonidine withdrawal protocol tomorrow. Patient when medically stable when she is discharged back to jail patient will need to sign a consent at the jail so they can communicate with the methadone clinic.  #2 hypothyroidism TSH is 5.078. Increase Synthroid to 137 MCG daily.  #3 dehydration IV fluids.  #4 hypokalemia Replete.  #5 prophylaxis Lovenox for DVT prophylaxis.   Code Status: Full Family Communication: updated patient at bedside no family present. Disposition Plan: Back to jail when medically stable   Consultants:  Neurology: Dr Thad Ranger  Procedures:  CT head 01/26/13  CXR 01/26/13  Antibiotics:  None  HPI/Subjective: Patient with no shaking spells overnight. Patient c/o diarrhea, muscle pain, headache. Patient continually asking for her methadone to be restarted.  Objective: Filed Vitals:   01/26/13 1808 01/26/13 1859 01/26/13 2000 01/27/13 0520  BP: 110/74 123/74 108/65 106/59  Pulse:  59 57 48  Temp:  98.2 F (36.8 C) 99.2 F (37.3 C) 98.3 F (36.8 C)  TempSrc:      Resp:  16 18 18   Height:  5\' 3"  (1.6 m)    Weight:  68.266 kg (150 lb 8 oz)  68.992 kg (152 lb 1.6 oz)  SpO2:  100% 100% 100%    Intake/Output Summary (Last 24 hours) at 01/27/13 9562 Last data filed at 01/26/13 1507  Gross per 24 hour  Intake   1200 ml  Output      0 ml  Net   1200 ml    Filed Weights   01/26/13 1859 01/27/13 0520  Weight: 68.266 kg (150 lb 8 oz) 68.992 kg (152 lb 1.6 oz)    Exam:   General:  NAD  Cardiovascular: tachycardia, RR  Respiratory: CTAB  Abdomen: Soft/NT/ND/+BS  Extremities: No c/c/e   Data Reviewed: Basic Metabolic Panel:  Recent Labs Lab 01/25/13 1822 01/26/13 1450 01/26/13 1605 01/26/13 1954 01/27/13 0415  NA 136 142  --   --  139  K 3.8 3.7  --   --  3.4*  CL 102 109  --   --  108  CO2 25  --   --   --  22  GLUCOSE 92 74  --   --  78  BUN 11 6  --   --  6  CREATININE 0.58 0.60  --  0.54 0.57  CALCIUM 9.4  --   --   --  8.6  MG  --   --  1.8  --   --    Liver Function Tests:  Recent Labs Lab 01/25/13 1822 01/27/13 0415  AST 16 17  ALT 12 11  ALKPHOS 76 64  BILITOT 0.4 0.6  PROT 7.4 6.1  ALBUMIN 3.7 3.1*    Recent Labs Lab 01/25/13 1822  LIPASE 20   No results found for this basename: AMMONIA,  in the last 168 hours CBC:  Recent Labs Lab 01/25/13 1822 01/26/13 1438 01/26/13 1450 01/26/13 1954 01/27/13 0415  WBC 10.9* 10.6*  --  11.6* 9.1  NEUTROABS 7.8* 7.4  --   --   --   HGB 13.0 12.7 12.9 12.0 12.2  HCT 38.3 36.0 38.0 34.0* 35.0*  MCV 87.8 88.0  --  87.4 87.9  PLT 250 238  --  228 221   Cardiac Enzymes: No results found for this basename: CKTOTAL, CKMB, CKMBINDEX, TROPONINI,  in the last 168 hours BNP (last 3 results) No results found for this basename: PROBNP,  in the last 8760 hours CBG: No results found for this basename: GLUCAP,  in the last 168 hours  Recent Results (from the past 240 hour(s))  MRSA PCR SCREENING     Status: None   Collection Time    01/26/13  7:06 PM      Result Value Range Status   MRSA by PCR NEGATIVE  NEGATIVE Final   Comment:            The GeneXpert MRSA Assay (FDA     approved for NASAL specimens     only), is one component of a     comprehensive MRSA colonization     surveillance program. It is not     intended to diagnose MRSA     infection  nor to guide or     monitor treatment for     MRSA infections.     Studies: Ct Head Wo Contrast  01/26/2013   *RADIOLOGY REPORT*  Clinical Data:  Altered mental status  CT HEAD WITHOUT CONTRAST  Technique:  Contiguous axial images were obtained from the base of the skull through the vertex without contrast  Comparison:  CT 11/16/2012  Findings:  The brain has a normal appearance without evidence for hemorrhage, acute infarction, hydrocephalus, or mass lesion.  There is no extra axial fluid collection.  The skull and paranasal sinuses are normal. Image quality degraded by motion.  Multiple images were repeated secondary to motion.  IMPRESSION: Normal CT of the head without contrast.   Original Report Authenticated By: Janeece Riggers, M.D.   Portable Chest 1 View  01/26/2013   *RADIOLOGY REPORT*  Clinical Data: Altered mental status.  Diffuse body aches.  PORTABLE CHEST - 1 VIEW  Comparison: 11/16/2012.  Findings: 1906 hours.  Interval extubation and removal of the nasogastric tube.  Heart size and mediastinal contours are stable. The lungs are clear.  There is no pleural effusion or pneumothorax. There is a mild thoracolumbar scoliosis which may be positional. Multiple telemetry leads overlie the chest.  IMPRESSION: No active cardiopulmonary process demonstrated.   Original Report Authenticated By: Carey Bullocks, M.D.    Scheduled Meds: . cloNIDine  0.1 mg Oral QID   Followed by  . [START ON 01/29/2013] cloNIDine  0.1 mg Oral BH-qamhs   Followed by  . [START ON 01/31/2013] cloNIDine  0.1 mg Oral QAC breakfast  . enoxaparin (LOVENOX) injection  40 mg Subcutaneous Q24H  . levETIRAcetam  1,250 mg Intravenous Q12H  . levothyroxine  125 mcg Oral QAC breakfast  . omega-3 acid ethyl esters  2 g Oral Daily  . potassium chloride  40 mEq Oral Once  . sodium chloride  3 mL Intravenous Q12H   Continuous Infusions: . sodium chloride      Principal Problem:   Seizure Active Problems:   Opiate  withdrawal   Hypothyroidism   Anxiety   Pseudoseizures    Time spent: > 35 mins    [facility]  [facility] Pager [phone] . If 7PM-7AM, please contact night-coverage at [email or web address], password  [email or web address] 01/27/2013, 9:22 AM  LOS: 1 day

## 2013-01-28 LAB — BASIC METABOLIC PANEL
BUN: 7 mg/dL (ref 6–23)
CO2: 26 mEq/L (ref 19–32)
Calcium: 8.5 mg/dL (ref 8.4–10.5)
Chloride: 104 mEq/L (ref 96–112)
Creatinine, Ser: 0.49 mg/dL — ABNORMAL LOW (ref 0.50–1.10)

## 2013-01-28 LAB — CBC
HCT: 35.9 % — ABNORMAL LOW (ref 36.0–46.0)
MCHC: 34.8 g/dL (ref 30.0–36.0)
MCV: 88 fL (ref 78.0–100.0)
Platelets: 226 10*3/uL (ref 150–400)
RDW: 12.6 % (ref 11.5–15.5)

## 2013-01-28 MED ORDER — METHADONE HCL 10 MG PO TABS: 40.0000 mg | ORAL_TABLET | Freq: Every day | ORAL | Status: DC

## 2013-01-28 MED ORDER — LORAZEPAM 1 MG PO TABS
1.0000 mg | ORAL_TABLET | Freq: Four times a day (QID) | ORAL | Status: DC | PRN
  Administered 2013-01-28 – 2013-01-29 (×4): 1 mg via ORAL
  Filled 2013-01-28 (×5): qty 1

## 2013-01-28 MED ORDER — METHADONE HCL 10 MG PO TABS
75.0000 mg | ORAL_TABLET | Freq: Every day | ORAL | Status: DC
  Administered 2013-01-29: 75 mg via ORAL
  Filled 2013-01-28: qty 8

## 2013-01-28 MED ORDER — ZOLPIDEM TARTRATE 5 MG PO TABS
5.0000 mg | ORAL_TABLET | Freq: Once | ORAL | Status: AC
  Administered 2013-01-28: 5 mg via ORAL
  Filled 2013-01-28: qty 1

## 2013-01-28 NOTE — Progress Notes (Signed)
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 Patient's last methadone dose was on 01/23/2013 at 76 mg daily per methadone clinic. Patient with no seizures overnight per nursing. Patient asking for methadone to be resumed since yesterday night. Patient not interested in detox.  Will start patient back on methadone 76 mg daily. . Patient when medically stable when she is discharged back to jail patient will need to sign a consent at the jail so they can communicate with the methadone clinic-[facility], [place] Arrangements are being made in order for her to go to the clinic to get her methadone and then return to jail the  #2 hypothyroidism TSH is 5.078. Increase Synthroid to 137 MCG daily.  #3 dehydration IV fluids.  #4 hypokalemia Replete.  #5 prophylaxis Lovenox for DVT prophylaxis.   Code Status: Full Family Communication: updated patient at bedside no family present. Disposition Plan: Back to jail when medically stable   Consultants:  Neurology: Dr Thad Ranger  Procedures:  CT head 01/26/13  CXR 01/26/13  Antibiotics:  None  HPI/Subjective: Patient doing fair. Tearful at times. No nausea vomiting. [employer] present in room  Objective: Filed Vitals:   01/28/13 0545 01/28/13 0800 01/28/13 1200 01/28/13 1201  BP: 112/71 112/70 99/73 104/71  Pulse: 58 64 79   Temp: 97.9 F (36.6 C) 98.3 F (36.8 C) 98.3 F (36.8 C)   TempSrc: Oral Oral Oral   Resp: 18 18 18    Height:      Weight: 68.493 kg (151 lb)     SpO2: 92% 99% 100%     Intake/Output Summary (Last 24 hours) at 01/28/13 1411 Last data filed at 01/28/13 1325  Gross per 24 hour  Intake   2220 ml  Output   2400 ml  Net   -180 ml   Filed Weights   01/26/13 1859 01/27/13 0520 01/28/13 0545  Weight:  68.266 kg (150 lb 8 oz) 68.992 kg (152 lb 1.6 oz) 68.493 kg (151 lb)    Exam:   General:  NAD  Cardiovascular: tachycardia, RR  Respiratory: CTAB  Abdomen: Soft/NT/ND/+BS  Extremities: No c/c/e   Data Reviewed: Basic Metabolic Panel:  Recent Labs Lab 01/25/13 1822 01/26/13 1450 01/26/13 1605 01/26/13 1954 01/27/13 0415 01/28/13 0355  NA 136 142  --   --  139 137  K 3.8 3.7  --   --  3.4* 3.6  CL 102 109  --   --  108 104  CO2 25  --   --   --  22 26  GLUCOSE 92 74  --   --  78 103*  BUN 11 6  --   --  6 7  CREATININE 0.58 0.60  --  0.54 0.57 0.49*  CALCIUM 9.4  --   --   --  8.6 8.5  MG  --   --  1.8  --   --   --    Liver Function Tests:  Recent Labs Lab 01/25/13 1822 01/27/13 0415  AST 16 17  ALT 12 11  ALKPHOS 76 64  BILITOT 0.4 0.6  PROT 7.4 6.1  ALBUMIN 3.7 3.1*    Recent Labs Lab 01/25/13 1822  LIPASE 20   No results found for this basename: AMMONIA,  in the last 168 hours CBC:  Recent Labs Lab 01/25/13 1822 01/26/13 1438 01/26/13 1450 01/26/13  1954 01/27/13 0415 01/28/13 0355  WBC 10.9* 10.6*  --  11.6* 9.1 9.0  NEUTROABS 7.8* 7.4  --   --   --   --   HGB 13.0 12.7 12.9 12.0 12.2 12.5  HCT 38.3 36.0 38.0 34.0* 35.0* 35.9*  MCV 87.8 88.0  --  87.4 87.9 88.0  PLT 250 238  --  228 221 226   Cardiac Enzymes: No results found for this basename: CKTOTAL, CKMB, CKMBINDEX, TROPONINI,  in the last 168 hours BNP (last 3 results) No results found for this basename: PROBNP,  in the last 8760 hours CBG: No results found for this basename: GLUCAP,  in the last 168 hours  Recent Results (from the past 240 hour(s))  MRSA PCR SCREENING     Status: None   Collection Time    01/26/13  7:06 PM      Result Value Range Status   MRSA by PCR NEGATIVE  NEGATIVE Final   Comment:            The GeneXpert MRSA Assay (FDA     approved for NASAL specimens     only), is one component of a     comprehensive MRSA colonization     surveillance program.  It is not     intended to diagnose MRSA     infection nor to guide or     monitor treatment for     MRSA infections.     Studies: Ct Head Wo Contrast  01/26/2013   *RADIOLOGY REPORT*  Clinical Data:  Altered mental status  CT HEAD WITHOUT CONTRAST  Technique:  Contiguous axial images were obtained from the base of the skull through the vertex without contrast  Comparison:  CT 11/16/2012  Findings:  The brain has a normal appearance without evidence for hemorrhage, acute infarction, hydrocephalus, or mass lesion.  There is no extra axial fluid collection.  The skull and paranasal sinuses are normal. Image quality degraded by motion.  Multiple images were repeated secondary to motion.  IMPRESSION: Normal CT of the head without contrast.   Original Report Authenticated By: Janeece Riggers, M.D.   Portable Chest 1 View  01/26/2013   *RADIOLOGY REPORT*  Clinical Data: Altered mental status.  Diffuse body aches.  PORTABLE CHEST - 1 VIEW  Comparison: 11/16/2012.  Findings: 1906 hours.  Interval extubation and removal of the nasogastric tube.  Heart size and mediastinal contours are stable. The lungs are clear.  There is no pleural effusion or pneumothorax. There is a mild thoracolumbar scoliosis which may be positional. Multiple telemetry leads overlie the chest.  IMPRESSION: No active cardiopulmonary process demonstrated.   Original Report Authenticated By: Carey Bullocks, M.D.    Scheduled Meds: . enoxaparin (LOVENOX) injection  40 mg Subcutaneous Q24H  . feeding supplement  1 Container Oral TID WC  . levETIRAcetam  1,250 mg Intravenous Q12H  . levothyroxine  137 mcg Oral QAC breakfast  . [START ON 01/29/2013] methadone  75 mg Oral Daily  . omega-3 acid ethyl esters  2 g Oral Daily  . sodium chloride  3 mL Intravenous Q12H   Continuous Infusions:    Principal Problem:   Seizure Active Problems:   Opiate withdrawal   Hypothyroidism   Anxiety   Pseudoseizures   Hypokalemia    Time spent: >  35 mins    Rhetta Mura  [facility] Pager [phone] . If 7PM-7AM, please contact night-coverage at [email or web address], password [facility] 01/28/2013, 2:11 PM  LOS: 2 days

## 2013-01-28 NOTE — Progress Notes (Signed)
Subjective: No further seizure activity reported. Patient is back on methadone 75 mg per day and feeling much better. She's aware that methadone cannot be continued when she is discharged and returned to jail.  Objective: Current vital signs: BP 112/70  Pulse 64  Temp(Src) 98.3 F (36.8 C) (Oral)  Resp 18  Ht 5\' 3"  (1.6 m)  Wt 68.493 kg (151 lb)  BMI 26.76 kg/m2  SpO2 99%  LMP 01/19/2013  Neurologic Exam: Alert and in no acute distress. Somewhat anxious, but mental status is otherwise unremarkable. She is well-oriented to time as well as place. Speech was normal. Patient moved extremities equally with equal strength throughout.  Lab Results: Results for orders placed during the hospital encounter of 01/26/13 (from the past 48 hour(s))  URINALYSIS, ROUTINE W REFLEX MICROSCOPIC     Status: Abnormal   Collection Time    01/26/13  2:17 PM      Result Value Range   Color, Urine YELLOW  YELLOW   APPearance CLEAR  CLEAR   Specific Gravity, Urine 1.025  1.005 - 1.030   pH 5.5  5.0 - 8.0   Glucose, UA NEGATIVE  NEGATIVE mg/dL   Hgb urine dipstick MODERATE (*) NEGATIVE   Bilirubin Urine NEGATIVE  NEGATIVE   Ketones, ur >80 (*) NEGATIVE mg/dL   Protein, ur NEGATIVE  NEGATIVE mg/dL   Urobilinogen, UA 0.2  0.0 - 1.0 mg/dL   Nitrite NEGATIVE  NEGATIVE   Leukocytes, UA NEGATIVE  NEGATIVE  URINE MICROSCOPIC-ADD ON     Status: Abnormal   Collection Time    01/26/13  2:17 PM      Result Value Range   Squamous Epithelial / LPF RARE  RARE   WBC, UA 0-2  <3 WBC/hpf   Bacteria, UA MANY (*) RARE   Urine-Other MUCOUS PRESENT    URINE RAPID DRUG SCREEN (HOSP PERFORMED)     Status: Abnormal   Collection Time    01/26/13  2:17 PM      Result Value Range   Opiates NONE DETECTED  NONE DETECTED   Cocaine NONE DETECTED  NONE DETECTED   Benzodiazepines POSITIVE (*) NONE DETECTED   Amphetamines NONE DETECTED  NONE DETECTED   Tetrahydrocannabinol POSITIVE (*) NONE DETECTED   Barbiturates NONE  DETECTED  NONE DETECTED   Comment:            DRUG SCREEN FOR MEDICAL PURPOSES     ONLY.  IF CONFIRMATION IS NEEDED     FOR ANY PURPOSE, NOTIFY LAB     WITHIN 5 DAYS.                LOWEST DETECTABLE LIMITS     FOR URINE DRUG SCREEN     Drug Class       Cutoff (ng/mL)     Amphetamine      1000     Barbiturate      200     Benzodiazepine   200     Tricyclics       300     Opiates          300     Cocaine          300     THC              50  POCT PREGNANCY, URINE     Status: None   Collection Time    01/26/13  2:21 PM      Result Value Range  Preg Test, Ur NEGATIVE  NEGATIVE   Comment:            THE SENSITIVITY OF THIS     METHODOLOGY IS >24 mIU/mL  CBC WITH DIFFERENTIAL     Status: Abnormal   Collection Time    01/26/13  2:38 PM      Result Value Range   WBC 10.6 (*) 4.0 - 10.5 K/uL   RBC 4.09  3.87 - 5.11 MIL/uL   Hemoglobin 12.7  12.0 - 15.0 g/dL   HCT 16.1  09.6 - 04.5 %   MCV 88.0  78.0 - 100.0 fL   MCH 31.1  26.0 - 34.0 pg   MCHC 35.3  30.0 - 36.0 g/dL   RDW 40.9  81.1 - 91.4 %   Platelets 238  150 - 400 K/uL   Neutrophils Relative % 70  43 - 77 %   Neutro Abs 7.4  1.7 - 7.7 K/uL   Lymphocytes Relative 24  12 - 46 %   Lymphs Abs 2.5  0.7 - 4.0 K/uL   Monocytes Relative 6  3 - 12 %   Monocytes Absolute 0.7  0.1 - 1.0 K/uL   Eosinophils Relative 0  0 - 5 %   Eosinophils Absolute 0.0  0.0 - 0.7 K/uL   Basophils Relative 0  0 - 1 %   Basophils Absolute 0.0  0.0 - 0.1 K/uL  POCT I-STAT, CHEM 8     Status: None   Collection Time    01/26/13  2:50 PM      Result Value Range   Sodium 142  135 - 145 mEq/L   Potassium 3.7  3.5 - 5.1 mEq/L   Chloride 109  96 - 112 mEq/L   BUN 6  6 - 23 mg/dL   Creatinine, Ser 7.82  0.50 - 1.10 mg/dL   Glucose, Bld 74  70 - 99 mg/dL   Calcium, Ion 9.56  2.13 - 1.23 mmol/L   TCO2 22  0 - 100 mmol/L   Hemoglobin 12.9  12.0 - 15.0 g/dL   HCT 08.6  57.8 - 46.9 %  MAGNESIUM     Status: None   Collection Time    01/26/13  4:05 PM       Result Value Range   Magnesium 1.8  1.5 - 2.5 mg/dL  MRSA PCR SCREENING     Status: None   Collection Time    01/26/13  7:06 PM      Result Value Range   MRSA by PCR NEGATIVE  NEGATIVE   Comment:            The GeneXpert MRSA Assay (FDA     approved for NASAL specimens     only), is one component of a     comprehensive MRSA colonization     surveillance program. It is not     intended to diagnose MRSA     infection nor to guide or     monitor treatment for     MRSA infections.  CBC     Status: Abnormal   Collection Time    01/26/13  7:54 PM      Result Value Range   WBC 11.6 (*) 4.0 - 10.5 K/uL   RBC 3.89  3.87 - 5.11 MIL/uL   Hemoglobin 12.0  12.0 - 15.0 g/dL   HCT 62.9 (*) 52.8 - 41.3 %   MCV 87.4  78.0 - 100.0 fL   MCH 30.8  26.0 - 34.0 pg   MCHC 35.3  30.0 - 36.0 g/dL   RDW 16.1  09.6 - 04.5 %   Platelets 228  150 - 400 K/uL  CREATININE, SERUM     Status: None   Collection Time    01/26/13  7:54 PM      Result Value Range   Creatinine, Ser 0.54  0.50 - 1.10 mg/dL   GFR calc non Af Amer >90  >90 mL/min   GFR calc Af Amer >90  >90 mL/min   Comment:            The eGFR has been calculated     using the CKD EPI equation.     This calculation has not been     validated in all clinical     situations.     eGFR's persistently     <90 mL/min signify     possible Chronic Kidney Disease.  TSH     Status: Abnormal   Collection Time    01/26/13  7:54 PM      Result Value Range   TSH 5.078 (*) 0.350 - 4.500 uIU/mL  PROLACTIN     Status: None   Collection Time    01/26/13  7:54 PM      Result Value Range   Prolactin 8.7     Comment: (NOTE)         Reference Ranges:                     Female:                       2.1 -  17.1 ng/ml                     Female:   Pregnant          9.7 - 208.5 ng/mL                               Non Pregnant      2.8 -  29.2 ng/mL                               Post Menopausal   1.8 -  20.3 ng/mL                         COMPREHENSIVE METABOLIC PANEL     Status: Abnormal   Collection Time    01/27/13  4:15 AM      Result Value Range   Sodium 139  135 - 145 mEq/L   Potassium 3.4 (*) 3.5 - 5.1 mEq/L   Chloride 108  96 - 112 mEq/L   CO2 22  19 - 32 mEq/L   Glucose, Bld 78  70 - 99 mg/dL   BUN 6  6 - 23 mg/dL   Creatinine, Ser 4.09  0.50 - 1.10 mg/dL   Calcium 8.6  8.4 - 81.1 mg/dL   Total Protein 6.1  6.0 - 8.3 g/dL   Albumin 3.1 (*) 3.5 - 5.2 g/dL   AST 17  0 - 37 U/L   ALT 11  0 - 35 U/L   Alkaline Phosphatase 64  39 - 117 U/L   Total Bilirubin 0.6  0.3 - 1.2 mg/dL   GFR calc non Af Amer >  90  >90 mL/min   GFR calc Af Amer >90  >90 mL/min   Comment:            The eGFR has been calculated     using the CKD EPI equation.     This calculation has not been     validated in all clinical     situations.     eGFR's persistently     <90 mL/min signify     possible Chronic Kidney Disease.  CBC     Status: Abnormal   Collection Time    01/27/13  4:15 AM      Result Value Range   WBC 9.1  4.0 - 10.5 K/uL   RBC 3.98  3.87 - 5.11 MIL/uL   Hemoglobin 12.2  12.0 - 15.0 g/dL   HCT 16.1 (*) 09.6 - 04.5 %   MCV 87.9  78.0 - 100.0 fL   MCH 30.7  26.0 - 34.0 pg   MCHC 34.9  30.0 - 36.0 g/dL   RDW 40.9  81.1 - 91.4 %   Platelets 221  150 - 400 K/uL  BASIC METABOLIC PANEL     Status: Abnormal   Collection Time    01/28/13  3:55 AM      Result Value Range   Sodium 137  135 - 145 mEq/L   Potassium 3.6  3.5 - 5.1 mEq/L   Chloride 104  96 - 112 mEq/L   CO2 26  19 - 32 mEq/L   Glucose, Bld 103 (*) 70 - 99 mg/dL   BUN 7  6 - 23 mg/dL   Creatinine, Ser 7.82 (*) 0.50 - 1.10 mg/dL   Calcium 8.5  8.4 - 95.6 mg/dL   GFR calc non Af Amer >90  >90 mL/min   GFR calc Af Amer >90  >90 mL/min   Comment:            The eGFR has been calculated     using the CKD EPI equation.     This calculation has not been     validated in all clinical     situations.     eGFR's persistently     <90 mL/min signify      possible Chronic Kidney Disease.  CBC     Status: Abnormal   Collection Time    01/28/13  3:55 AM      Result Value Range   WBC 9.0  4.0 - 10.5 K/uL   RBC 4.08  3.87 - 5.11 MIL/uL   Hemoglobin 12.5  12.0 - 15.0 g/dL   HCT 21.3 (*) 08.6 - 57.8 %   MCV 88.0  78.0 - 100.0 fL   MCH 30.6  26.0 - 34.0 pg   MCHC 34.8  30.0 - 36.0 g/dL   RDW 46.9  62.9 - 52.8 %   Platelets 226  150 - 400 K/uL    Studies/Results: Ct Head Wo Contrast  01/26/2013   *RADIOLOGY REPORT*  Clinical Data:  Altered mental status  CT HEAD WITHOUT CONTRAST  Technique:  Contiguous axial images were obtained from the base of the skull through the vertex without contrast  Comparison:  CT 11/16/2012  Findings:  The brain has a normal appearance without evidence for hemorrhage, acute infarction, hydrocephalus, or mass lesion.  There is no extra axial fluid collection.  The skull and paranasal sinuses are normal. Image quality degraded by motion.  Multiple images were repeated secondary to motion.  IMPRESSION: Normal CT of  the head without contrast.   Original Report Authenticated By: Janeece Riggers, M.D.   Portable Chest 1 View  01/26/2013   *RADIOLOGY REPORT*  Clinical Data: Altered mental status.  Diffuse body aches.  PORTABLE CHEST - 1 VIEW  Comparison: 11/16/2012.  Findings: 1906 hours.  Interval extubation and removal of the nasogastric tube.  Heart size and mediastinal contours are stable. The lungs are clear.  There is no pleural effusion or pneumothorax. There is a mild thoracolumbar scoliosis which may be positional. Multiple telemetry leads overlie the chest.  IMPRESSION: No active cardiopulmonary process demonstrated.   Original Report Authenticated By: Carey Bullocks, M.D.    Medications:  I have reviewed the patient's current medications. Scheduled: . cloNIDine  0.1 mg Oral QID   Followed by  . [START ON 01/29/2013] cloNIDine  0.1 mg Oral BH-qamhs   Followed by  . [START ON 01/31/2013] cloNIDine  0.1 mg Oral QAC  breakfast  . enoxaparin (LOVENOX) injection  40 mg Subcutaneous Q24H  . feeding supplement  1 Container Oral TID WC  . levETIRAcetam  1,250 mg Intravenous Q12H  . levothyroxine  137 mcg Oral QAC breakfast  . methadone  75 mg Oral Daily  . omega-3 acid ethyl esters  2 g Oral Daily  . sodium chloride  3 mL Intravenous Q12H   Continuous:  JYN:[id number], acetaminophen, alum & mag hydroxide-simeth, bisacodyl, dicyclomine, hydrOXYzine, loperamide, LORazepam, methocarbamol, naproxen, ondansetron, polyethylene glycol  Assessment/Plan: 1. Seizure disorder with seizures controlled on current dose of Keppra 1250 mg twice a day. 2. Chronic drug abuse on methadone maintenance.  Recommendations:. 1. No change in current dose of Keppra daily. 2. Patient will need to be weaned from methadone, as this agent will be unavailable and she was discharged from hospital and returned to jail. Recommend continuing efforts to taper and eventually discontinue methadone. 3. No further neurological intervention is indicated at this point. We will remain available for reevaluation if clinically indicated.  C.R. Roseanne Reno, MD [facility] [phone]  01/28/2013  9:14 AM

## 2013-01-29 MED ORDER — LEVETIRACETAM 1000 MG PO TABS: 1000.0000 mg | ORAL_TABLET | Freq: Two times a day (BID) | ORAL | Status: DC

## 2013-01-29 MED ORDER — BISACODYL 10 MG RE SUPP: 10.0000 mg | Freq: Every day | RECTAL | Status: DC | PRN

## 2013-01-29 MED ORDER — METHADONE HCL 5 MG PO TABS: 75.0000 mg | ORAL_TABLET | Freq: Every day | ORAL | Status: DC

## 2013-01-29 MED ORDER — METHOCARBAMOL 500 MG PO TABS: 500.0000 mg | ORAL_TABLET | Freq: Three times a day (TID) | ORAL | Status: DC | PRN

## 2013-01-29 MED ORDER — LEVOTHYROXINE SODIUM 137 MCG PO TABS: 137.0000 ug | ORAL_TABLET | Freq: Every day | ORAL | Status: DC

## 2013-01-29 NOTE — Care Management (Signed)
CM did set up an appointment with [facility] for 0830 am Friday 01-30-13 [address] [place], [place] [address] [phone]. Liliane Shi will arrange transportation. Pt has a Electrical engineer at the treatment center. Will make pt aware of appointment. No further needs from CM at this time. Gala Lewandowsky, RN,BSN [phone]

## 2013-01-29 NOTE — Discharge Summary (Signed)
Physician Discharge Summary  Tammy Winters ZOX:[id number] DOB: 1986/02/25 DOA: 01/26/2013  PCP: No PCP Per Patient  Admit date: 01/26/2013 Discharge date: 01/29/2013  Time spent: 30 minutes  Recommendations for Outpatient Follow-up:  1. Patient requires Methadone 75 mg daily.  This has been arranged with [facility]. Patient will need to be transported from jail to the facility daily to get her methadone and then back to jail for the period of time of her incarceration 2. Recommend continuation of Keppra at her prior dose 3. Thyroxine has been increased-please consider rechecking a TSH in 4 weeks  Discharge Diagnoses:  Principal Problem:   Seizure Active Problems:   Opiate withdrawal   Hypothyroidism   Anxiety   Pseudoseizures   Hypokalemia   Discharge Condition: stable  Diet recommendation: normal  Filed Weights   01/27/13 0520 01/28/13 0545 01/29/13 0404  Weight: 68.992 kg (152 lb 1.6 oz) 68.493 kg (151 lb) 69.31 kg (152 lb 12.8 oz)    History of present illness:  27 yr old CF admitted recently 4/13-4/14 with Sz 2/2 to methadone withdrawal. She was incarcerated recently since 01/23/2013 did not receive her methadone and was kept on Keppra and admitted with witnessed tonic-clonic seizures in jail, given 10 mg oral Valium and then paramedics note another seizure and the patient to 0.5 mg IV Versed. Patient noted initially in the emergency room to have white count 10.6 UDS positive for [facility] Below for further hospital detail  Hospital Course:  #1 seizures versus pseudoseizures versus methadone withdrawal  Seizures likely secondary to methadone withdrawal-neurology was consulted and her Keppra dose was adjusted. Patient's last methadone dose was on 01/23/2013 at 76 mg daily per methadone clinic. Initially was thought that patient could not receive her methadone therefore rapid opiate withdrawal protocol with chronic being was performed- arrangements currently being made to  facilitate transport to and from jail for patient to obtain her methadone. She'll ultimately need to followup withSpecialists: Methadone clinic: [facility]/Dorothy Baidoff [phone] once she is discharged from jail to get her regular care #2 hypothyroidism  TSH is 5.078. Increase Synthroid to 137 MCG daily.  #3 dehydration  IV fluids administered and patient currently stable #4 hypokalemia  Repleted and resolved currently   Consultants:  Neurology: Dr Thad Ranger  Procedures:  CT head 01/26/13  CXR 01/26/13  Antibiotics:  None  Discharge Exam: Filed Vitals:   01/28/13 1201 01/28/13 2000 01/29/13 0008 01/29/13 0404  BP: 104/71 102/71 94/61 108/69  Pulse:  73 52 50  Temp:  98.1 F (36.7 C) 98.1 F (36.7 C) 97.9 F (36.6 C)  TempSrc:  Oral Oral Oral  Resp:  18 18 18   Height:      Weight:    69.31 kg (152 lb 12.8 oz)  SpO2:  98% 98% 100%   Alert somnolent but otherwise doesn't seem to be in any distress  General: Alert Cardiovascular: S1-S2 no murmur or gallop Respiratory: Clinically clear  Discharge Instructions  Discharge Orders   Future Orders Complete By Expires     Diet - low sodium heart healthy  As directed     Discharge instructions  As directed     Comments:      Follow up with your regular MD when you leave jail You will be given a Rx for some medications-Your MD can prescribe your other medications    Increase activity slowly  As directed         Medication List    STOP  taking these medications       PRESCRIPTION MEDICATION      TAKE these medications       acetaminophen 325 MG tablet  Commonly known as:  TYLENOL  Take 650 mg by mouth 3 (three) times daily as needed for pain.     bisacodyl 10 MG suppository  Commonly known as:  DULCOLAX  Place 1 suppository (10 mg total) rectally daily as needed.     Fish Oil 1000 MG Caps  Take 2 capsules by mouth daily.     ibuprofen 600 MG tablet  Commonly known as:  ADVIL,MOTRIN  Take 1  tablet (600 mg total) by mouth every 6 (six) hours as needed for pain.     levETIRAcetam 1000 MG tablet  Commonly known as:  KEPPRA  Take 1 tablet (1,000 mg total) by mouth 2 (two) times daily.     levothyroxine 137 MCG tablet  Commonly known as:  SYNTHROID, LEVOTHROID  Take 1 tablet (137 mcg total) by mouth daily before breakfast.     methadone 5 MG tablet  Commonly known as:  DOLOPHINE  Take 15 tablets (75 mg total) by mouth daily.     methocarbamol 500 MG tablet  Commonly known as:  ROBAXIN  Take 1 tablet (500 mg total) by mouth every 8 (eight) hours as needed.       Allergies  Allergen Reactions  . Adhesive (Tape) Other (See Comments)    redness, burning, skin tears  . Reglan (Metoclopramide) Other (See Comments)    body twitching   . Zofran (Ondansetron Hcl) Other (See Comments)    Body twitching      The results of significant diagnostics from this hospitalization (including imaging, microbiology, ancillary and laboratory) are listed below for reference.    Significant Diagnostic Studies: Ct Head Wo Contrast  01/26/2013   *RADIOLOGY REPORT*  Clinical Data:  Altered mental status  CT HEAD WITHOUT CONTRAST  Technique:  Contiguous axial images were obtained from the base of the skull through the vertex without contrast  Comparison:  CT 11/16/2012  Findings:  The brain has a normal appearance without evidence for hemorrhage, acute infarction, hydrocephalus, or mass lesion.  There is no extra axial fluid collection.  The skull and paranasal sinuses are normal. Image quality degraded by motion.  Multiple images were repeated secondary to motion.  IMPRESSION: Normal CT of the head without contrast.   Original Report Authenticated By: Janeece Riggers, M.D.   Portable Chest 1 View  01/26/2013   *RADIOLOGY REPORT*  Clinical Data: Altered mental status.  Diffuse body aches.  PORTABLE CHEST - 1 VIEW  Comparison: 11/16/2012.  Findings: 1906 hours.  Interval extubation and removal of the  nasogastric tube.  Heart size and mediastinal contours are stable. The lungs are clear.  There is no pleural effusion or pneumothorax. There is a mild thoracolumbar scoliosis which may be positional. Multiple telemetry leads overlie the chest.  IMPRESSION: No active cardiopulmonary process demonstrated.   Original Report Authenticated By: Carey Bullocks, M.D.    Microbiology: Recent Results (from the past 240 hour(s))  MRSA PCR SCREENING     Status: None   Collection Time    01/26/13  7:06 PM      Result Value Range Status   MRSA by PCR NEGATIVE  NEGATIVE Final   Comment:            The GeneXpert MRSA Assay (FDA     approved for NASAL specimens     only),  is one component of a     comprehensive MRSA colonization     surveillance program. It is not     intended to diagnose MRSA     infection nor to guide or     monitor treatment for     MRSA infections.     Labs: Basic Metabolic Panel:  Recent Labs Lab 01/25/13 1822 01/26/13 1450 01/26/13 1605 01/26/13 1954 01/27/13 0415 01/28/13 0355  NA 136 142  --   --  139 137  K 3.8 3.7  --   --  3.4* 3.6  CL 102 109  --   --  108 104  CO2 25  --   --   --  22 26  GLUCOSE 92 74  --   --  78 103*  BUN 11 6  --   --  6 7  CREATININE 0.58 0.60  --  0.54 0.57 0.49*  CALCIUM 9.4  --   --   --  8.6 8.5  MG  --   --  1.8  --   --   --    Liver Function Tests:  Recent Labs Lab 01/25/13 1822 01/27/13 0415  AST 16 17  ALT 12 11  ALKPHOS 76 64  BILITOT 0.4 0.6  PROT 7.4 6.1  ALBUMIN 3.7 3.1*    Recent Labs Lab 01/25/13 1822  LIPASE 20   No results found for this basename: AMMONIA,  in the last 168 hours CBC:  Recent Labs Lab 01/25/13 1822 01/26/13 1438 01/26/13 1450 01/26/13 1954 01/27/13 0415 01/28/13 0355  WBC 10.9* 10.6*  --  11.6* 9.1 9.0  NEUTROABS 7.8* 7.4  --   --   --   --   HGB 13.0 12.7 12.9 12.0 12.2 12.5  HCT 38.3 36.0 38.0 34.0* 35.0* 35.9*  MCV 87.8 88.0  --  87.4 87.9 88.0  PLT 250 238  --  228 221  226   Cardiac Enzymes: No results found for this basename: CKTOTAL, CKMB, CKMBINDEX, TROPONINI,  in the last 168 hours BNP: BNP (last 3 results) No results found for this basename: PROBNP,  in the last 8760 hours CBG: No results found for this basename: GLUCAP,  in the last 168 hours     Signed:  Rhetta Mura  [facility] 01/29/2013, 8:15 AM

## 2013-02-02 ENCOUNTER — Observation Stay: Payer: Self-pay | Admitting: Internal Medicine

## 2013-02-02 LAB — COMPREHENSIVE METABOLIC PANEL
Albumin: 3.9 g/dL (ref 3.4–5.0)
Alkaline Phosphatase: 92 U/L (ref 50–136)
Anion Gap: 6 — ABNORMAL LOW (ref 7–16)
BUN: 11 mg/dL (ref 7–18)
Bilirubin,Total: 0.5 mg/dL (ref 0.2–1.0)
Calcium, Total: 9.2 mg/dL (ref 8.5–10.1)
Chloride: 106 mmol/L (ref 98–107)
Co2: 28 mmol/L (ref 21–32)
Creatinine: 0.64 mg/dL (ref 0.60–1.30)
EGFR (Non-African Amer.): >60
Osmolality: 279 (ref 275–301)
Sodium: 140 mmol/L (ref 136–145)
Total Protein: 7.9 g/dL (ref 6.4–8.2)

## 2013-02-02 LAB — CBC
HGB: 14.2 g/dL (ref 12.0–16.0)
MCH: 30.9 pg (ref 26.0–34.0)
MCHC: 34.3 g/dL (ref 32.0–36.0)
MCV: 90 fL (ref 80–100)
Platelet: 240 10*3/uL (ref 150–440)
WBC: 8 10*3/uL (ref 3.6–11.0)

## 2013-02-02 LAB — URINALYSIS, COMPLETE
Bilirubin,UR: NEGATIVE
Nitrite: POSITIVE
Specific Gravity: 1.028 (ref 1.003–1.030)

## 2013-02-02 LAB — DRUG SCREEN, URINE
Cocaine Metabolite,Ur ~~LOC~~: NEGATIVE (ref ?–300)
MDMA (Ecstasy)Ur Screen: NEGATIVE (ref ?–500)
Methadone, Ur Screen: POSITIVE (ref ?–300)
Tricyclic, Ur Screen: NEGATIVE (ref ?–1000)

## 2013-02-02 LAB — ETHANOL
Ethanol %: <0.003 % (ref 0.000–0.080)
Ethanol: <3 mg/dL

## 2013-02-02 LAB — PHENYTOIN LEVEL, TOTAL: Dilantin: 12.6 ug/mL (ref 10.0–20.0)

## 2013-02-02 LAB — TSH: Thyroid Stimulating Horm: 1.31 u[IU]/mL

## 2013-02-03 LAB — BASIC METABOLIC PANEL
BUN: 7 mg/dL (ref 7–18)
EGFR (African American): >60
EGFR (Non-African Amer.): >60
Glucose: 102 mg/dL — ABNORMAL HIGH (ref 65–99)
Osmolality: 281 (ref 275–301)
Potassium: 3.9 mmol/L (ref 3.5–5.1)
Sodium: 142 mmol/L (ref 136–145)

## 2013-02-03 LAB — CBC WITH DIFFERENTIAL/PLATELET
Basophil %: 0.4 %
Eosinophil #: 0.2 10*3/uL (ref 0.0–0.7)
Eosinophil %: 2 %
HGB: 12.7 g/dL (ref 12.0–16.0)
Lymphocyte #: 3.4 10*3/uL (ref 1.0–3.6)
Lymphocyte %: 31.3 %
MCHC: 34.5 g/dL (ref 32.0–36.0)
MCV: 90 fL (ref 80–100)
Monocyte #: 0.7 x10 3/mm (ref 0.2–0.9)
Monocyte %: 6.8 %
Neutrophil #: 6.4 10*3/uL (ref 1.4–6.5)
Platelet: 228 10*3/uL (ref 150–440)
RBC: 4.11 10*6/uL (ref 3.80–5.20)
RDW: 12.8 % (ref 11.5–14.5)
WBC: 10.8 10*3/uL (ref 3.6–11.0)

## 2013-02-04 LAB — URINE CULTURE

## 2013-02-05 ENCOUNTER — Inpatient Hospital Stay (HOSPITAL_COMMUNITY)
Admission: EM | Admit: 2013-02-05 | Discharge: 2013-02-09 | DRG: 391 | Disposition: A | Discharge status: Home / Self Care | Payer: Self-pay | Attending: Internal Medicine | Admitting: Internal Medicine

## 2013-02-05 ENCOUNTER — Encounter (HOSPITAL_COMMUNITY): Payer: Self-pay | Admitting: *Deleted

## 2013-02-05 DIAGNOSIS — E876 Hypokalemia: Secondary | ICD-10-CM

## 2013-02-05 DIAGNOSIS — F1210 Cannabis abuse, uncomplicated: Secondary | ICD-10-CM | POA: Diagnosis present

## 2013-02-05 DIAGNOSIS — R45851 Suicidal ideations: Secondary | ICD-10-CM

## 2013-02-05 DIAGNOSIS — F1123 Opioid dependence with withdrawal: Secondary | ICD-10-CM

## 2013-02-05 DIAGNOSIS — L509 Urticaria, unspecified: Secondary | ICD-10-CM | POA: Diagnosis present

## 2013-02-05 DIAGNOSIS — R21 Rash and other nonspecific skin eruption: Secondary | ICD-10-CM

## 2013-02-05 DIAGNOSIS — F419 Anxiety disorder, unspecified: Secondary | ICD-10-CM

## 2013-02-05 DIAGNOSIS — F411 Generalized anxiety disorder: Secondary | ICD-10-CM | POA: Diagnosis present

## 2013-02-05 DIAGNOSIS — M545 Low back pain, unspecified: Secondary | ICD-10-CM | POA: Diagnosis present

## 2013-02-05 DIAGNOSIS — R112 Nausea with vomiting, unspecified: Principal | ICD-10-CM

## 2013-02-05 DIAGNOSIS — R1110 Vomiting, unspecified: Secondary | ICD-10-CM

## 2013-02-05 DIAGNOSIS — E860 Dehydration: Secondary | ICD-10-CM

## 2013-02-05 DIAGNOSIS — E43 Unspecified severe protein-calorie malnutrition: Secondary | ICD-10-CM | POA: Diagnosis present

## 2013-02-05 DIAGNOSIS — M129 Arthropathy, unspecified: Secondary | ICD-10-CM | POA: Diagnosis present

## 2013-02-05 DIAGNOSIS — E039 Hypothyroidism, unspecified: Secondary | ICD-10-CM

## 2013-02-05 DIAGNOSIS — E109 Type 1 diabetes mellitus without complications: Secondary | ICD-10-CM | POA: Diagnosis present

## 2013-02-05 DIAGNOSIS — K219 Gastro-esophageal reflux disease without esophagitis: Secondary | ICD-10-CM | POA: Diagnosis present

## 2013-02-05 DIAGNOSIS — Z5900 Homelessness unspecified: Secondary | ICD-10-CM

## 2013-02-05 DIAGNOSIS — F1193 Opioid use, unspecified with withdrawal: Secondary | ICD-10-CM

## 2013-02-05 DIAGNOSIS — B9689 Other specified bacterial agents as the cause of diseases classified elsewhere: Secondary | ICD-10-CM | POA: Diagnosis present

## 2013-02-05 DIAGNOSIS — G8929 Other chronic pain: Secondary | ICD-10-CM | POA: Diagnosis present

## 2013-02-05 DIAGNOSIS — N760 Acute vaginitis: Secondary | ICD-10-CM | POA: Diagnosis present

## 2013-02-05 DIAGNOSIS — IMO0002 Reserved for concepts with insufficient information to code with codable children: Secondary | ICD-10-CM

## 2013-02-05 DIAGNOSIS — A499 Bacterial infection, unspecified: Secondary | ICD-10-CM | POA: Diagnosis present

## 2013-02-05 DIAGNOSIS — F17200 Nicotine dependence, unspecified, uncomplicated: Secondary | ICD-10-CM | POA: Diagnosis present

## 2013-02-05 DIAGNOSIS — F445 Conversion disorder with seizures or convulsions: Secondary | ICD-10-CM

## 2013-02-05 DIAGNOSIS — F1120 Opioid dependence, uncomplicated: Secondary | ICD-10-CM | POA: Diagnosis present

## 2013-02-05 LAB — URINALYSIS, ROUTINE W REFLEX MICROSCOPIC
Hgb urine dipstick: NEGATIVE
Nitrite: NEGATIVE
Protein, ur: 30 mg/dL — AB
Urobilinogen, UA: 0.2 mg/dL (ref 0.0–1.0)

## 2013-02-05 LAB — CBC WITH DIFFERENTIAL/PLATELET
Basophils Absolute: 0 10*3/uL (ref 0.0–0.1)
Basophils Relative: 0 % (ref 0–1)
MCHC: 35.2 g/dL (ref 30.0–36.0)
Neutro Abs: 9.7 10*3/uL — ABNORMAL HIGH (ref 1.7–7.7)
Neutrophils Relative %: 67 % (ref 43–77)
RDW: 12.7 % (ref 11.5–15.5)

## 2013-02-05 LAB — COMPREHENSIVE METABOLIC PANEL
ALT: 35 U/L (ref 0–35)
Calcium: 9.3 mg/dL (ref 8.4–10.5)
Chloride: 106 mEq/L (ref 96–112)
GFR calc Af Amer: >90 mL/min (ref >90)
GFR calc non Af Amer: >90 mL/min (ref >90)
Potassium: 2.9 mEq/L — ABNORMAL LOW (ref 3.5–5.1)

## 2013-02-05 LAB — ACETAMINOPHEN LEVEL: Acetaminophen (Tylenol), Serum: <15 ug/mL (ref 10–30)

## 2013-02-05 LAB — URINE MICROSCOPIC-ADD ON

## 2013-02-05 LAB — RAPID URINE DRUG SCREEN, HOSP PERFORMED
Amphetamines: NOT DETECTED
Barbiturates: NOT DETECTED
Benzodiazepines: POSITIVE — AB

## 2013-02-05 LAB — ETHANOL: Alcohol, Ethyl (B): <11 mg/dL (ref 0–11)

## 2013-02-05 LAB — LIPASE, BLOOD: Lipase: 68 U/L — ABNORMAL HIGH (ref 11–59)

## 2013-02-05 MED ORDER — IBUPROFEN 400 MG PO TABS: 600.0000 mg | ORAL_TABLET | Freq: Three times a day (TID) | ORAL | Status: DC | PRN

## 2013-02-05 MED ORDER — PROMETHAZINE HCL 25 MG PO TABS
25.0000 mg | ORAL_TABLET | ORAL | Status: AC
  Administered 2013-02-05: 25 mg via ORAL
  Filled 2013-02-05: qty 1

## 2013-02-05 MED ORDER — SODIUM CHLORIDE 0.9 % IV BOLUS (SEPSIS)
1000.0000 mL | Freq: Once | INTRAVENOUS | Status: AC
  Administered 2013-02-05: 1000 mL via INTRAVENOUS

## 2013-02-05 MED ORDER — ZOLPIDEM TARTRATE 5 MG PO TABS
5.0000 mg | ORAL_TABLET | Freq: Every evening | ORAL | Status: DC | PRN
  Filled 2013-02-05: qty 1

## 2013-02-05 MED ORDER — LEVETIRACETAM 500 MG PO TABS
1000.0000 mg | ORAL_TABLET | Freq: Two times a day (BID) | ORAL | Status: DC
  Administered 2013-02-06: 1000 mg via ORAL
  Filled 2013-02-05 (×4): qty 2

## 2013-02-05 MED ORDER — OMEGA-3-ACID ETHYL ESTERS 1 G PO CAPS
1.0000 g | ORAL_CAPSULE | Freq: Two times a day (BID) | ORAL | Status: DC
  Administered 2013-02-06: 1 g via ORAL
  Filled 2013-02-05 (×4): qty 1

## 2013-02-05 MED ORDER — BISACODYL 10 MG RE SUPP: 10.0000 mg | Freq: Every day | RECTAL | Status: DC | PRN

## 2013-02-05 MED ORDER — NICOTINE 21 MG/24HR TD PT24
21.0000 mg | MEDICATED_PATCH | Freq: Every day | TRANSDERMAL | Status: DC | PRN
  Administered 2013-02-08: 21 mg via TRANSDERMAL
  Filled 2013-02-05: qty 1

## 2013-02-05 MED ORDER — ACETAMINOPHEN 325 MG PO TABS
650.0000 mg | ORAL_TABLET | ORAL | Status: DC | PRN
  Administered 2013-02-06: 650 mg via ORAL
  Filled 2013-02-05: qty 2

## 2013-02-05 MED ORDER — POTASSIUM CHLORIDE CRYS ER 20 MEQ PO TBCR
40.0000 meq | EXTENDED_RELEASE_TABLET | Freq: Once | ORAL | Status: AC
  Administered 2013-02-05: 40 meq via ORAL
  Filled 2013-02-05: qty 2

## 2013-02-05 MED ORDER — PROMETHAZINE HCL 25 MG PO TABS: 25.0000 mg | ORAL_TABLET | Freq: Four times a day (QID) | ORAL | Status: DC | PRN

## 2013-02-05 MED ORDER — LEVOTHYROXINE SODIUM 137 MCG PO TABS
137.0000 ug | ORAL_TABLET | Freq: Every day | ORAL | Status: DC
  Administered 2013-02-06 – 2013-02-09 (×4): 137 ug via ORAL
  Filled 2013-02-05 (×7): qty 1

## 2013-02-05 MED ORDER — PROMETHAZINE HCL 25 MG/ML IJ SOLN
25.0000 mg | Freq: Once | INTRAMUSCULAR | Status: AC
  Administered 2013-02-05: 25 mg via INTRAVENOUS
  Filled 2013-02-05: qty 1

## 2013-02-05 MED ORDER — LORAZEPAM 1 MG PO TABS: 1.0000 mg | ORAL_TABLET | Freq: Three times a day (TID) | ORAL | Status: DC | PRN

## 2013-02-05 NOTE — ED Notes (Signed)
Patient took potassium pills without any difficulty.
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
## 2013-02-05 NOTE — ED Provider Notes (Signed)
History    CSN: [id number] Arrival date & time 02/05/13  1428  First MD Initiated Contact with Patient 02/05/13 1529     Chief Complaint  Patient presents with  . Emesis   (Consider location/radiation/quality/duration/timing/severity/associated sxs/prior Treatment) The history is provided by the patient and medical records.   Patient presents to the ED for abdominal pain, nausea, vomiting, and diarrhea.  Abdominal pain described as a generalized, cramping sensation.  Patient reports she has been in jail for the past several days, and has not had her methadone in approximately one week. Has been on methadone for the past 4+ years-- states she was put on it after becoming addicted to percocet and vicodin.  Notes had some seizures in jail despite taking her Keppra 1000mg  BID-- these were unwitnessed.  Patient states this is not withdrawal, states she is "legitimately sick."  Denies any chest pain, SOB, palpitations, dizziness, weakness, numbness, or paresthesias.  Prior cholecystectomy.  No urinary sx or vaginal discharge.  No fevers, sweats, or chills.  Medical records reviewed-- pt previously seen in the ED for opoid withdrawal w/pseudoseizures and status epilepticus.  Pt is not currently having any form of tremors or seizure activity.  VS stable.  Pt not escorted by police or security.  Past Medical History  Diagnosis Date  . Zoster   . Drug addict   . Heroin abuse   . Anxiety   . Hypothyroidism 01/26/2013  . Chest pain at rest   . Pneumonia     "twice" (01/26/2013)  . Chronic bronchitis     "get it q yr" (01/26/2013)  . Shortness of breath     "always; a little bit" (01/26/2013)  . Type I diabetes mellitus   . GERD (gastroesophageal reflux disease)   . History of stomach ulcers   . Seizures     "today was my second" (01/26/2013)  . Arthritis     "lower spine" (01/26/2013)  . Chronic lower back pain    Past Surgical History  Procedure Laterality Date  . Cholecystectomy    .  Dilation and curettage of uterus      "I've had 3" (01/26/2013)  . Multiple tooth extractions      "I've had all my teeth removed" (01/26/2013)   Family History  Problem Relation Age of Onset  . Diabetes Mother   . Cancer Mother   . Diabetes Father   . Cancer Father    History  Substance Use Topics  . Smoking status: Current Some Day Smoker -- 1.00 packs/day for 12 years    Types: Cigarettes  . Smokeless tobacco: Never Used  . Alcohol Use: No     Comment: 01/26/2013 "last drink was years ago"   OB History   Grav Para Term Preterm Abortions TAB SAB Ect Mult Living            2     Review of Systems  Gastrointestinal: Positive for nausea, vomiting, abdominal pain and diarrhea.  All other systems reviewed and are negative.    Allergies  Adhesive; Reglan; and Zofran  Home Medications   Current Outpatient Rx  Name  Route  Sig  Dispense  Refill  . acetaminophen (TYLENOL) 325 MG tablet   Oral   Take 650 mg by mouth 3 (three) times daily as needed for pain.         . bisacodyl (DULCOLAX) 10 MG suppository   Rectal   Place 1 suppository (10 mg total) rectally daily as needed.  12 suppository   0   . ibuprofen (ADVIL,MOTRIN) 600 MG tablet   Oral   Take 1 tablet (600 mg total) by mouth every 6 (six) hours as needed for pain.   30 tablet   0   . levETIRAcetam (KEPPRA) 1000 MG tablet   Oral   Take 1 tablet (1,000 mg total) by mouth 2 (two) times daily.   60 tablet   0   . levothyroxine (SYNTHROID, LEVOTHROID) 137 MCG tablet   Oral   Take 1 tablet (137 mcg total) by mouth daily before breakfast.   30 tablet   0   . methadone (DOLOPHINE) 5 MG tablet   Oral   Take 15 tablets (75 mg total) by mouth daily.   450 tablet   0   . methocarbamol (ROBAXIN) 500 MG tablet   Oral   Take 1 tablet (500 mg total) by mouth every 8 (eight) hours as needed.   90 tablet   0   . Omega-3 Fatty Acids (FISH OIL) 1000 MG CAPS   Oral   Take 2 capsules by mouth daily.           BP 126/83  Pulse 95  Temp(Src) 99.4 F (37.4 C) (Oral)  Resp 18  SpO2 96%  LMP 01/19/2013  Physical Exam  Nursing note and vitals reviewed. Constitutional: She is oriented to person, place, and time. She appears well-developed and well-nourished. No distress.  HENT:  Head: Normocephalic and atraumatic.  Mouth/Throat: Oropharynx is clear and moist.  Eyes: Conjunctivae and EOM are normal. Pupils are equal, round, and reactive to light.  Neck: Normal range of motion. Neck supple.  Cardiovascular: Normal rate, regular rhythm and normal heart sounds.   Pulmonary/Chest: Effort normal and breath sounds normal. No respiratory distress. She has no wheezes.  Abdominal: Soft. Bowel sounds are normal. There is generalized tenderness. There is no CVA tenderness, no tenderness at McBurney's point and negative Murphy's sign.  Musculoskeletal: Normal range of motion. She exhibits no edema.  Neurological: She is alert and oriented to person, place, and time. She has normal strength. She displays no tremor. No cranial nerve deficit or sensory deficit. She displays no seizure activity.  Skin: Skin is warm and dry. She is not diaphoretic.  Psychiatric: Her speech is normal. Her mood appears anxious.    ED Course  Procedures (including critical care time) Labs Reviewed  CBC WITH DIFFERENTIAL - Abnormal; Notable for the following:    WBC 14.4 (*)    Neutro Abs 9.7 (*)    Monocytes Absolute 1.1 (*)    All other components within normal limits  COMPREHENSIVE METABOLIC PANEL - Abnormal; Notable for the following:    Potassium 2.9 (*)    Glucose, Bld 104 (*)    All other components within normal limits  LIPASE, BLOOD - Abnormal; Notable for the following:    Lipase 68 (*)    All other components within normal limits  URINALYSIS, ROUTINE W REFLEX MICROSCOPIC - Abnormal; Notable for the following:    Color, Urine AMBER (*)    APPearance CLOUDY (*)    Bilirubin Urine SMALL (*)    Ketones, ur 15  (*)    Protein, ur 30 (*)    All other components within normal limits  SALICYLATE LEVEL - Abnormal; Notable for the following:    Salicylate Lvl <2.0 (*)    All other components within normal limits  URINE RAPID DRUG SCREEN (HOSP PERFORMED) - Abnormal; Notable for the following:  Cocaine POSITIVE (*)    Benzodiazepines POSITIVE (*)    Tetrahydrocannabinol POSITIVE (*)    All other components within normal limits  URINE MICROSCOPIC-ADD ON - Abnormal; Notable for the following:    Squamous Epithelial / LPF MANY (*)    Bacteria, UA FEW (*)    Crystals CA OXALATE CRYSTALS (*)    All other components within normal limits  URINE CULTURE  ACETAMINOPHEN LEVEL  ETHANOL   No results found.  No diagnosis found.  MDM   Labs as above- mild leukocytosis at 14.4, K+ low at 2.9, otherwise non-impressive.  40 KCl and 1L IVF, and phenergan given.  Pts VS have remained stable, no tremors, seizure activity, or active vomiting while in the ED.  I feel that pt is going through withdrawal from methadone.  Pt afebrile, non-toxic appearing, NAD-- ok for d/c.  FU with neurology for recurrent seizures.  Rx phenergan.  Discussed with pt, she agreed.  Return precautions advised.  At time of discharge, patient expresses SI without plan to nursing staff. When questioned about this she initially stated she is having these thoughts because she feels she needs further treatment for opiate withdrawal and does want to be d/c.  I personally evaluated the patient and again questioned about this. She states she continues to have SI without plan because she is "not in her right mind", and she is seeing red things flying around the room. Denies actual HI but states "i wouldn't put it past me".   UDS completed-- + for cocaine, benzos, and marijuana.  ACT consulted- they will see her.  Temp holding orders and home meds placed.  Garlon Hatchet, PA-C 02/06/13 [phone]

## 2013-02-05 NOTE — ED Notes (Signed)
Offered meal bag to patient. She refused.

## 2013-02-05 NOTE — ED Notes (Signed)
The pt came to fast track  With medical clearance however the pa saw and the pt is not medical clearance but nv and diarrhea and needs to be   Moved to a regular med bed chg rn notified
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
## 2013-02-05 NOTE — ED Notes (Signed)
Pt still c/o the visitors  Talking about her.  She thinks she is going to die.  She still needs a  urine
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
## 2013-02-05 NOTE — ED Notes (Signed)
Walked into patient's room to discharge her and she said she did not want to leave. After I explained to her we had no reason to keep her here she then states she was going to say that she wanted to kill herself so we would not discharge her out. I asked her if she really wanted to kill herself or was it because she did not want to go and she said yes she wanted to kill herself. I notified the PA of the patient's current statement and we both went into the patient's room to talk with her. The patient stated she wants help because she wants to kill herself. Charge RN was notified and house coverage for a sister.
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
## 2013-02-05 NOTE — ED Notes (Signed)
Iv started and labs drawn

## 2013-02-05 NOTE — ED Notes (Signed)
Pt reports recently being in jail and has been off her methadone x 1 week, had one seizure while in jail. No acute distress noted at triage.

## 2013-02-05 NOTE — ED Notes (Signed)
Patient placed in paper scrubs, security wanded and personal belongings removed from the room.

## 2013-02-05 NOTE — ED Notes (Signed)
Patient states she is unable to take potassium pills at this time. She states she will wait till the phenergan works.

## 2013-02-06 ENCOUNTER — Emergency Department (HOSPITAL_COMMUNITY)

## 2013-02-06 ENCOUNTER — Encounter (HOSPITAL_COMMUNITY): Payer: Self-pay | Admitting: *Deleted

## 2013-02-06 DIAGNOSIS — E876 Hypokalemia: Secondary | ICD-10-CM

## 2013-02-06 DIAGNOSIS — F1120 Opioid dependence, uncomplicated: Secondary | ICD-10-CM

## 2013-02-06 DIAGNOSIS — R112 Nausea with vomiting, unspecified: Principal | ICD-10-CM

## 2013-02-06 DIAGNOSIS — F19939 Other psychoactive substance use, unspecified with withdrawal, unspecified: Secondary | ICD-10-CM

## 2013-02-06 DIAGNOSIS — R1110 Vomiting, unspecified: Secondary | ICD-10-CM

## 2013-02-06 DIAGNOSIS — R569 Unspecified convulsions: Secondary | ICD-10-CM

## 2013-02-06 DIAGNOSIS — R21 Rash and other nonspecific skin eruption: Secondary | ICD-10-CM

## 2013-02-06 DIAGNOSIS — E039 Hypothyroidism, unspecified: Secondary | ICD-10-CM

## 2013-02-06 LAB — CREATININE, SERUM
Creatinine, Ser: 0.57 mg/dL (ref 0.50–1.10)
GFR calc Af Amer: >90 mL/min (ref >90)
GFR calc non Af Amer: >90 mL/min (ref >90)

## 2013-02-06 LAB — CBC
HCT: 39.7 % (ref 36.0–46.0)
MCHC: 35.3 g/dL (ref 30.0–36.0)
Platelets: 280 10*3/uL (ref 150–400)
RDW: 12.5 % (ref 11.5–15.5)
WBC: 11.3 10*3/uL — ABNORMAL HIGH (ref 4.0–10.5)

## 2013-02-06 LAB — POCT I-STAT, CHEM 8
BUN: 4 mg/dL — ABNORMAL LOW (ref 6–23)
Chloride: 102 mEq/L (ref 96–112)
Creatinine, Ser: 0.6 mg/dL (ref 0.50–1.10)
Glucose, Bld: 92 mg/dL (ref 70–99)
Hemoglobin: 13.6 g/dL (ref 12.0–15.0)
Potassium: 3.4 mEq/L — ABNORMAL LOW (ref 3.5–5.1)

## 2013-02-06 LAB — CBC WITH DIFFERENTIAL/PLATELET
Eosinophils Absolute: 0.1 10*3/uL (ref 0.0–0.7)
Hemoglobin: 13.6 g/dL (ref 12.0–15.0)
Lymphocytes Relative: 32 % (ref 12–46)
Lymphs Abs: 3.9 10*3/uL (ref 0.7–4.0)
MCH: 30.8 pg (ref 26.0–34.0)
Monocytes Relative: 9 % (ref 3–12)
Neutrophils Relative %: 58 % (ref 43–77)
RBC: 4.42 MIL/uL (ref 3.87–5.11)
WBC: 12.3 10*3/uL — ABNORMAL HIGH (ref 4.0–10.5)

## 2013-02-06 LAB — WET PREP, GENITAL
Trich, Wet Prep: NONE SEEN
Yeast Wet Prep HPF POC: NONE SEEN

## 2013-02-06 MED ORDER — PROMETHAZINE HCL 25 MG PO TABS
25.0000 mg | ORAL_TABLET | ORAL | Status: DC | PRN
  Administered 2013-02-06: 25 mg via ORAL
  Filled 2013-02-06 (×2): qty 1

## 2013-02-06 MED ORDER — HYDROXYZINE HCL 25 MG PO TABS
25.0000 mg | ORAL_TABLET | Freq: Four times a day (QID) | ORAL | Status: DC | PRN
  Administered 2013-02-06: 25 mg via ORAL
  Filled 2013-02-06 (×2): qty 1

## 2013-02-06 MED ORDER — SODIUM CHLORIDE 0.9 % IV BOLUS (SEPSIS)
1000.0000 mL | Freq: Once | INTRAVENOUS | Status: AC
  Administered 2013-02-06: 1000 mL via INTRAVENOUS

## 2013-02-06 MED ORDER — HEPARIN SODIUM (PORCINE) 5000 UNIT/ML IJ SOLN
5000.0000 [IU] | Freq: Three times a day (TID) | INTRAMUSCULAR | Status: DC
  Administered 2013-02-06 – 2013-02-07 (×3): 5000 [IU] via SUBCUTANEOUS
  Filled 2013-02-06 (×12): qty 1

## 2013-02-06 MED ORDER — PROMETHAZINE HCL 25 MG/ML IJ SOLN
25.0000 mg | Freq: Four times a day (QID) | INTRAMUSCULAR | Status: DC | PRN
  Administered 2013-02-06 – 2013-02-09 (×8): 25 mg via INTRAVENOUS
  Filled 2013-02-06 (×9): qty 1

## 2013-02-06 MED ORDER — IOHEXOL 300 MG/ML  SOLN
100.0000 mL | Freq: Once | INTRAMUSCULAR | Status: AC | PRN
  Administered 2013-02-06: 100 mL via INTRAVENOUS

## 2013-02-06 MED ORDER — NAPROXEN 250 MG PO TABS: 500.0000 mg | ORAL_TABLET | Freq: Two times a day (BID) | ORAL | Status: DC | PRN

## 2013-02-06 MED ORDER — POTASSIUM CHLORIDE 10 MEQ/100ML IV SOLN
10.0000 meq | Freq: Once | INTRAVENOUS | Status: AC
  Administered 2013-02-06: 10 meq via INTRAVENOUS

## 2013-02-06 MED ORDER — ALBUTEROL SULFATE (5 MG/ML) 0.5% IN NEBU: 2.5000 mg | INHALATION_SOLUTION | RESPIRATORY_TRACT | Status: DC | PRN

## 2013-02-06 MED ORDER — SODIUM CHLORIDE 0.9 % IV SOLN
Freq: Once | INTRAVENOUS | Status: AC
  Administered 2013-02-06: 10:00:00 via INTRAVENOUS

## 2013-02-06 MED ORDER — POTASSIUM CHLORIDE 10 MEQ/100ML IV SOLN
10.0000 meq | Freq: Once | INTRAVENOUS | Status: AC
  Administered 2013-02-06: 10 meq via INTRAVENOUS
  Filled 2013-02-06: qty 200

## 2013-02-06 MED ORDER — LOPERAMIDE HCL 2 MG PO CAPS
2.0000 mg | ORAL_CAPSULE | ORAL | Status: DC | PRN
  Administered 2013-02-06: 4 mg via ORAL
  Filled 2013-02-06: qty 2

## 2013-02-06 MED ORDER — POLYETHYLENE GLYCOL 3350 17 G PO PACK
17.0000 g | PACK | Freq: Every day | ORAL | Status: DC | PRN
  Filled 2013-02-06: qty 1

## 2013-02-06 MED ORDER — POTASSIUM CHLORIDE IN NACL 40-0.9 MEQ/L-% IV SOLN
INTRAVENOUS | Status: DC
  Administered 2013-02-06: 16:00:00 via INTRAVENOUS
  Filled 2013-02-06 (×2): qty 1000

## 2013-02-06 MED ORDER — GUAIFENESIN-DM 100-10 MG/5ML PO SYRP
5.0000 mL | ORAL_SOLUTION | ORAL | Status: DC | PRN
  Filled 2013-02-06: qty 5

## 2013-02-06 MED ORDER — PROMETHAZINE HCL 25 MG RE SUPP
25.0000 mg | RECTAL | Status: DC | PRN
  Administered 2013-02-06: 25 mg via RECTAL
  Filled 2013-02-06: qty 1

## 2013-02-06 MED ORDER — LORAZEPAM 1 MG PO TABS
1.0000 mg | ORAL_TABLET | Freq: Four times a day (QID) | ORAL | Status: DC | PRN
  Administered 2013-02-06 – 2013-02-09 (×11): 1 mg via ORAL
  Filled 2013-02-06 (×11): qty 1

## 2013-02-06 MED ORDER — ALUM & MAG HYDROXIDE-SIMETH 200-200-20 MG/5ML PO SUSP
30.0000 mL | Freq: Four times a day (QID) | ORAL | Status: DC | PRN
  Administered 2013-02-07 – 2013-02-09 (×2): 30 mL via ORAL
  Filled 2013-02-06 (×4): qty 30

## 2013-02-06 MED ORDER — METHADONE HCL 10 MG PO TABS
10.0000 mg | ORAL_TABLET | Freq: Three times a day (TID) | ORAL | Status: DC
  Administered 2013-02-06 – 2013-02-09 (×11): 10 mg via ORAL
  Filled 2013-02-06 (×11): qty 1

## 2013-02-06 MED ORDER — DIPHENHYDRAMINE HCL 25 MG PO CAPS
25.0000 mg | ORAL_CAPSULE | Freq: Four times a day (QID) | ORAL | Status: DC | PRN
  Administered 2013-02-07 – 2013-02-08 (×6): 25 mg via ORAL
  Filled 2013-02-06 (×7): qty 1

## 2013-02-06 MED ORDER — SODIUM CHLORIDE 0.9 % IV SOLN
1000.0000 mg | Freq: Two times a day (BID) | INTRAVENOUS | Status: DC
  Administered 2013-02-06 – 2013-02-08 (×4): 1000 mg via INTRAVENOUS
  Filled 2013-02-06 (×5): qty 10

## 2013-02-06 MED ORDER — PANTOPRAZOLE SODIUM 40 MG IV SOLR
40.0000 mg | Freq: Two times a day (BID) | INTRAVENOUS | Status: DC
  Administered 2013-02-06: 40 mg via INTRAVENOUS
  Filled 2013-02-06 (×3): qty 40

## 2013-02-06 MED ORDER — DIPHENHYDRAMINE HCL 50 MG/ML IJ SOLN
25.0000 mg | Freq: Once | INTRAMUSCULAR | Status: AC
  Administered 2013-02-06: 25 mg via INTRAVENOUS
  Filled 2013-02-06: qty 1

## 2013-02-06 MED ORDER — METHYLPREDNISOLONE SODIUM SUCC 125 MG IJ SOLR
60.0000 mg | Freq: Once | INTRAMUSCULAR | Status: AC
  Administered 2013-02-06: 60 mg via INTRAVENOUS
  Filled 2013-02-06: qty 0.96

## 2013-02-06 MED ORDER — METHOCARBAMOL 500 MG PO TABS: 500.0000 mg | ORAL_TABLET | Freq: Three times a day (TID) | ORAL | Status: DC | PRN

## 2013-02-06 MED ORDER — DICYCLOMINE HCL 20 MG PO TABS
20.0000 mg | ORAL_TABLET | Freq: Four times a day (QID) | ORAL | Status: DC | PRN
  Administered 2013-02-06: 20 mg via ORAL
  Filled 2013-02-06: qty 1

## 2013-02-06 NOTE — ED Notes (Signed)
Called pt methadone clinic and got after hours person. They are going to try to access pt dosage and call back with information.

## 2013-02-06 NOTE — ED Notes (Signed)
Patient transported to X-ray 

## 2013-02-06 NOTE — H&P (Signed)
[facility]                                                                                    Patient Demographics  Tammy Winters, is a 27 y.o. female  CSN: [id number]  MRN: [id number]  DOB - [birth year]  Admit Date - 02/05/2013  Outpatient Primary MD for the patient is No PCP Per Patient   With History of -  Past Medical History  Diagnosis Date  . Zoster   . Drug addict   . Heroin abuse   . Anxiety   . Hypothyroidism 01/26/2013  . Chest pain at rest   . Pneumonia     "twice" (01/26/2013)  . Chronic bronchitis     "get it q yr" (01/26/2013)  . Shortness of breath     "always; a little bit" (01/26/2013)  . Type I diabetes mellitus   . GERD (gastroesophageal reflux disease)   . History of stomach ulcers   . Seizures     "today was my second" (01/26/2013)  . Arthritis     "lower spine" (01/26/2013)  . Chronic lower back pain       Past Surgical History  Procedure Laterality Date  . Cholecystectomy    . Dilation and curettage of uterus      "I've had 3" (01/26/2013)  . Multiple tooth extractions      "I've had all my teeth removed" (01/26/2013)    in for   Chief Complaint  Patient presents with  . Emesis     HPI  Tammy Winters  is a 27 y.o. 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, chronic bronchitis, GERD, questionable diabetes mellitus in the chart but on no medications and stable glucose levels, hypothyroidism, who presented to the ER about 24 hours ago with complaints of nausea vomiting and diarrhea ongoing for about a day prior to her visit, she is also noticed some generalized abdominal pain due to persistent nausea vomiting, denies any fever chills, no headache, no chest or abdominal pain, she has not been getting her methadone for the last several days, in the ER she also reported of some  suicidal ideation and she was kept on suicidal or cautions with one-to-one sitter, this morning she continued to have some nausea, also ran a low-grade fever, she was also noticed to have a macular rash all over her body or spleen extremities and right flank and I was called to admit the patient    Review of Systems    In addition to the HPI above,   No Fever-chills, No Headache, No changes with Vision or hearing, No problems swallowing food or Liquids, No Chest pain, Cough or Shortness of Breath, Positive generalized Abdominal pain with nausea vomiting and diarrhea No Blood in stool or Urine, No dysuria, She also developed a diffuse macular rash all over in the last day or so No new joints pains-aches,  No new weakness, tingling, numbness in any extremity, No recent weight gain or loss, No polyuria, polydypsia or polyphagia, No significant Mental Stressors.  A full 10 point Review of Systems was done, except as stated above, all other Review of Systems were negative.   Social History History  Substance Use Topics  . Smoking status: Current Some Day Smoker -- 1.00 packs/day for 12 years    Types: Cigarettes  . Smokeless tobacco: Never Used  . Alcohol Use: No     Comment: 01/26/2013 "last drink was years ago"      Family History Family History  Problem Relation Age of Onset  . Diabetes Mother   . Cancer Mother   . Diabetes Father   . Cancer Father       Prior to Admission medications   Medication Sig Start Date End Date Taking? Authorizing Provider  acetaminophen (TYLENOL) 325 MG tablet Take 650 mg by mouth 3 (three) times daily as needed for pain.    Historical Provider, MD  bisacodyl (DULCOLAX) 10 MG suppository Place 1 suppository (10 mg total) rectally daily as needed. 01/29/13   Rhetta Mura, MD  ibuprofen (ADVIL,MOTRIN) 600 MG tablet Take 1 tablet (600 mg total) by mouth every 6 (six) hours as needed for pain. 12/22/12   Nelia Shi, MD  levETIRAcetam  (KEPPRA) 1000 MG tablet Take 1 tablet (1,000 mg total) by mouth 2 (two) times daily. 01/29/13   Rhetta Mura, MD  levothyroxine (SYNTHROID, LEVOTHROID) 137 MCG tablet Take 1 tablet (137 mcg total) by mouth daily before breakfast. 01/29/13   Rhetta Mura, MD  methadone (DOLOPHINE) 5 MG tablet Take 15 tablets (75 mg total) by mouth daily. 01/29/13   Rhetta Mura, MD  methocarbamol (ROBAXIN) 500 MG tablet Take 1 tablet (500 mg total) by mouth every 8 (eight) hours as needed. 01/29/13   Rhetta Mura, MD  Omega-3 Fatty Acids (FISH OIL) 1000 MG CAPS Take 2 capsules by mouth daily.    Historical Provider, MD    Allergies  Allergen Reactions  . Adhesive (Tape) Other (See Comments)    redness, burning, skin tears  . Reglan (Metoclopramide) Other (See Comments)    body twitching   . Zofran (Ondansetron Hcl) Other (See Comments)    Body twitching    Physical Exam  Vitals  Blood pressure 132/81, pulse 61, temperature 99.1 F (37.3 C), temperature source Oral, resp. rate 10, last menstrual period 01/19/2013, SpO2 100.00%.   1. General Young white female lying in bed in NAD,    2. Normal affect and insight, Not Suicidal or Homicidal, Awake Alert, Oriented X 3. Premature loss of dentition  3. No F.N deficits, ALL C.Nerves Intact, Strength 5/5 all 4 extremities, Sensation intact all 4 extremities, Plantars down going.  4. Ears and Eyes appear Normal, Conjunctivae clear, PERRLA. Moist Oral Mucosa.  5. Supple Neck, No JVD, No cervical lymphadenopathy appriciated, No Carotid Bruits.  6. Symmetrical Chest wall movement, Good air movement bilaterally, CTAB.  7. RRR, No Gallops, Rubs or Murmurs, No Parasternal Heave.  8. Positive Bowel Sounds, Abdomen Soft, mild generalized ache on deep palpation, No organomegaly appriciated,No rebound -guarding or rigidity.  9.  No Cyanosis, Normal Skin Turgor, she has multiple circular tiny macular lesions all over her body mostly in both  extremities and on the right flank they have been there for the last day or so  10. Good muscle tone,  joints appear normal , no effusions, Normal ROM.  11. No Palpable Lymph Nodes in Neck or Axillae     Data Review  CBC  Recent Labs Lab 02/05/13 1542 02/06/13 0815 02/06/13 1356  WBC  14.4* 12.3*  --   HGB 13.2 13.6 13.6  HCT 37.5 38.9 40.0  PLT 279 274  --   MCV 88.2 88.0  --   MCH 31.1 30.8  --   MCHC 35.2 35.0  --   RDW 12.7 12.6  --   LYMPHSABS 3.6 3.9  --   MONOABS 1.1* 1.1*  --   EOSABS 0.0 0.1  --   BASOSABS 0.0 0.0  --    ------------------------------------------------------------------------------------------------------------------  Chemistries   Recent Labs Lab 02/05/13 1542 02/06/13 1356  NA 142 142  K 2.9* 3.4*  CL 106 102  CO2 25  --   GLUCOSE 104* 92  BUN 7 4*  CREATININE 0.62 0.60  CALCIUM 9.3  --   AST 32  --   ALT 35  --   ALKPHOS 79  --   BILITOT 0.3  --    ------------------------------------------------------------------------------------------------------------------ CrCl is unknown because both a height and weight (above a minimum accepted value) are required for this calculation. ------------------------------------------------------------------------------------------------------------------ No results found for this basename: TSH, T4TOTAL, FREET3, T3FREE, THYROIDAB,  in the last 72 hours   Coagulation profile No results found for this basename: INR, PROTIME,  in the last 168 hours ------------------------------------------------------------------------------------------------------------------- No results found for this basename: DDIMER,  in the last 72 hours -------------------------------------------------------------------------------------------------------------------  Cardiac Enzymes No results found for this basename: CK, CKMB, TROPONINI, MYOGLOBIN,  in the last 168  hours ------------------------------------------------------------------------------------------------------------------ No components found with this basename: POCBNP,    ---------------------------------------------------------------------------------------------------------------  Urinalysis    Component Value Date/Time   COLORURINE AMBER* 02/05/2013 2208   APPEARANCEUR CLOUDY* 02/05/2013 2208   LABSPEC 1.026 02/05/2013 2208   PHURINE 6.0 02/05/2013 2208   GLUCOSEU NEGATIVE 02/05/2013 2208   HGBUR NEGATIVE 02/05/2013 2208   BILIRUBINUR SMALL* 02/05/2013 2208   KETONESUR 15* 02/05/2013 2208   PROTEINUR 30* 02/05/2013 2208   UROBILINOGEN 0.2 02/05/2013 2208   NITRITE NEGATIVE 02/05/2013 2208   LEUKOCYTESUR NEGATIVE 02/05/2013 2208    ----------------------------------------------------------------------------------------------------------------  Imaging results:   Dg Chest 2 View  02/06/2013   *RADIOLOGY REPORT*  Clinical Data: Weakness and emesis  CHEST - 2 VIEW  Comparison:  January 26, 2013  Findings: Lungs clear.  Heart size and pulmonary vascularity are normal.  No adenopathy.  No bone lesions.  IMPRESSION: No abnormality noted.   Original Report Authenticated By: Bretta Bang, M.D.     Ct Abdomen Pelvis W Contrast A represent going to [employer] minutes 3 times a day when necessary is [phone] she is currently in ER 20 suicidal ideation cell phone [phone] 02/06/2013   *RADIOLOGY REPORT*  Clinical Data: Rule out small bowel obstruction  CT ABDOMEN AND PELVIS WITH CONTRAST  Technique:  Multidetector CT imaging of the abdomen and pelvis was performed following the standard protocol during bolus administration of intravenous contrast.  Contrast: OMNIPAQUE IOHEXOL 300 MG/ML  SOLN  Comparison: None  Findings:  Lung bases are clear.  No pleural or pericardial effusion.  No focal liver abnormalities identified.  Prior cholecystectomy. Normal appearance of the pancreas.  The spleen is normal.   Normal appearance of the adrenal glands.  The right kidney is normal.  The left kidney is normal.  Urinary bladder is unremarkable.  The uterus and adnexal structures are unremarkable.  The stomach is normal.  The small bowel loops have a normal course and caliber.  No obstruction.  The appendix is visualized and appears unremarkable.  Normal appearance of the colon.  There is a small  amount of free fluid within the right distal pericolic gutter and dependent portion of the pelvis.  The abdominal aorta has a normal caliber.  There is no upper abdominal adenopathy.  No pelvic or inguinal adenopathy noted.  Review of the visualized osseous structures is unremarkable.  IMPRESSION:  1. Small amount of free fluid within the abdomen and pelvis is nonspecific.  In a premenopausal female a small amount of pelvic fluid may be physiologic.   2. No evidence for small bowel obstruction.   Original Report Authenticated By: Signa Kell, M.D.         Assessment & Plan   1. Nausea vomiting and diarrhea low-grade fever x1. Could be viral gastroenteritis, with be admitted to the floor, bowel rest with liquid diet, IV fluids, check a GI pathogen panel. Will also obtain blood cultures. Could be methadone withdrawal but doubt it as she has taken methadone once in the last 10 days.   2. Macular rash which appear to be hives likely due to drug allergy. Will minimize new prescribed rales in the ER, will only give minimal medications which are necessary, of note patient is homeless I will order rickettsial panel 2 rule out tickborne disease. Also blood cultures have been ordered.   3. History of seizure versus pseudoseizures. Will switch Keppra to IV as she is throwing up and oral Absorption is not reliable at this time.   4. History of methadone use with possible methadone withdrawal he did will place her on low-dose methadone for now. Outpatient she follows [facility].   5. Hypokalemia will be replaced  and rechecked in the morning with magnesium.   6. Hypothyroidism continue home dose Synthroid check TSH.   7. Suicidal ideation in the ER. Currently no such heart's, will continue suicide precautions, one-to-one sitter, psych consult has been called.    DVT Prophylaxis Heparin    AM Labs Ordered, also please review Full Orders  Family Communication: Admission, patients condition and plan of care including tests being ordered have been discussed with the patient who indicates understanding and agree with the plan and Code Status.  Code Status Full  Likely DC to  TBD  Time spent in minutes :35  Condition Marinell Blight K M.D on 02/06/2013 at 3:12 PM  Between 7am to 7pm - Pager - [phone]  After 7pm go to [email or web address] - password [email or web address]  And look for the night coverage person covering me after hours  [facility] Office  [phone]

## 2013-02-06 NOTE — ED Notes (Addendum)
Patient hot to touch. Complains of constant nausea with frequent dry heaves. Has noticed a rash developing on her leg and lower back, right arm and hand. Dr Silverio Lay aware and will assess rash

## 2013-02-06 NOTE — ED Notes (Signed)
Pt wheeled to the bathroom with sitter

## 2013-02-06 NOTE — ED Notes (Signed)
Dr Silverio Lay in to reeval pt. She has not been able to tolerate po contrast for ct. Ct called and will put pt on list for transport

## 2013-02-06 NOTE — BH Assessment (Signed)
[facility] Assessment Progress Note      Consulted Fransisca Kaufmann NP re admission for this [facility] patient. Would like a repeat potassium level, she has received K+ but has not had a repeat level and last level was 2.9

## 2013-02-06 NOTE — ED Provider Notes (Addendum)
Filed Vitals:   02/06/13 0639  BP: 99/61  Pulse: 49  Temp: 100.8 F (38.2 C)  Resp: 16   Patient had a fever 100.8 F this AM, slightly low BP. Also had some melena as per patient. Will recheck CBC. I think low grade temp is likely from withdrawal. She is on opioid withdrawal protocol. Will continue to monitor.   Richardean Canal, MD 02/06/13 [phone]  2:17 PM Still not feeling well and felt nauseous. CT ab/pel showed no SBO. Given that she hasn't felt better with IVF and IV phenergan. She will need to be admitted for further IV hydration and workup. Hospitalist request pelvic exam and cxr.   2:53 PM Pelvic exam showed mild diffuse tenderness but no CMT. Whitish discharge, GC/chlamydia, wet prep sent.   Richardean Canal, MD 02/06/13 [phone]

## 2013-02-06 NOTE — ED Notes (Signed)
Pt moved to pod C bed 22. Report given to RN

## 2013-02-07 DIAGNOSIS — E860 Dehydration: Secondary | ICD-10-CM

## 2013-02-07 DIAGNOSIS — F411 Generalized anxiety disorder: Secondary | ICD-10-CM

## 2013-02-07 DIAGNOSIS — F1920 Other psychoactive substance dependence, uncomplicated: Secondary | ICD-10-CM

## 2013-02-07 LAB — BASIC METABOLIC PANEL
BUN: 7 mg/dL (ref 6–23)
CO2: 24 mEq/L (ref 19–32)
Calcium: 8.9 mg/dL (ref 8.4–10.5)
Chloride: 105 mEq/L (ref 96–112)
Creatinine, Ser: 0.53 mg/dL (ref 0.50–1.10)
Glucose, Bld: 103 mg/dL — ABNORMAL HIGH (ref 70–99)

## 2013-02-07 LAB — CBC
HCT: 36.5 % (ref 36.0–46.0)
MCH: 30.2 pg (ref 26.0–34.0)
MCHC: 34.5 g/dL (ref 30.0–36.0)
MCV: 87.5 fL (ref 78.0–100.0)
Platelets: 257 10*3/uL (ref 150–400)
RDW: 12.5 % (ref 11.5–15.5)
WBC: 11.6 10*3/uL — ABNORMAL HIGH (ref 4.0–10.5)

## 2013-02-07 MED ORDER — BOOST / RESOURCE BREEZE PO LIQD
1.0000 | Freq: Two times a day (BID) | ORAL | Status: DC
  Administered 2013-02-07 – 2013-02-08 (×3): 1 via ORAL

## 2013-02-07 MED ORDER — METRONIDAZOLE IN NACL 5-0.79 MG/ML-% IV SOLN
500.0000 mg | Freq: Three times a day (TID) | INTRAVENOUS | Status: DC
  Administered 2013-02-07 – 2013-02-09 (×8): 500 mg via INTRAVENOUS
  Filled 2013-02-07 (×10): qty 100

## 2013-02-07 MED ORDER — SODIUM CHLORIDE 0.9 % IV SOLN
INTRAVENOUS | Status: DC
  Administered 2013-02-07 – 2013-02-08 (×3): via INTRAVENOUS
  Administered 2013-02-08: 75 mL/h via INTRAVENOUS

## 2013-02-07 MED ORDER — PANTOPRAZOLE SODIUM 40 MG PO TBEC
40.0000 mg | DELAYED_RELEASE_TABLET | Freq: Every day | ORAL | Status: DC
  Administered 2013-02-07 – 2013-02-09 (×3): 40 mg via ORAL
  Filled 2013-02-07 (×2): qty 1

## 2013-02-07 NOTE — Progress Notes (Signed)
INITIAL NUTRITION ASSESSMENT  DOCUMENTATION CODES Per approved criteria  -Severe malnutrition in the context of acute illness   INTERVENTION:  Resource Breeze two times daily between meals (250 kcals, 9 gm protein per 8 fl oz carton) RD to follow for nutrition care plan  NUTRITION DIAGNOSIS: Inadequate oral intake related to poor appetite as evidenced by patient report and weight loss.  Goal: Oral intake with meals & supplements to meet >/= 90% of estimated nutrition needs  Monitor:  PO & supplemental intake, weight, labs, I/O's  Reason for Assessment: Malnutrition Screening Tool Report  27 y.o. female  Admitting Dx: N&V (nausea and vomiting)  ASSESSMENT: Patient with a history of seizures, heroine abuse, DM; was admitted in April of this year with seizures that were felt to be related to Methadone withdrawal; recently admitted with multiple witnessed t/c seizures. Admitted currently 2/2 witnessed seizures in jail; pt without her Methadone x 2-3 weeks. Complains of diarrhea/vomiting x 2 days. Pt expressed SI without plan to nursing staff in ED and subsequently was admitted.  Patient reports her appetite is improving; pt with 18 lb weight loss within the past month. Suspect weight loss is secondary to recent poor oral intake per patient report. She states that her oral intake is beginning to improve. She states that she is tolerating her meals. Asked why she could only get applesauce for dessert - discussed that she is ordered for bland foods currently to promote better tolerance, pt verbalized understanding.  Pt meets criteria for severe MALNUTRITION in the context of acute illness as evidenced by 11% wt loss x 1 month and oral intake of <50% x at least 5 days.  Pt seen by RD staff during previous hospitalization, pt was ordered for Resource Breeze oral nutrition supplement, will resume at this time.  Height: Ht Readings from Last 1 Encounters:  02/06/13 5\' 3"  (1.6 m)     Weight: Wt Readings from Last 1 Encounters:  02/06/13 135 lb 6 oz (61.406 kg)    Ideal Body Weight: 115 lb  % Ideal Body Weight: 117%  Wt Readings from Last 10 Encounters:  02/06/13 135 lb 6 oz (61.406 kg)  01/29/13 152 lb 12.8 oz (69.31 kg)  12/22/12 152 lb (68.947 kg)  11/18/12 153 lb 7 oz (69.6 kg)    Usual Body Weight: 152 lb  % Usual Body Weight: 89%  BMI:  Body mass index is 23.99 kg/(m^2). WNL  Estimated Nutritional Needs: Kcal: 1700-1900 Protein: 80-90 gm Fluid: 1.7-1.9 L  Skin: Intact  Diet Order: Fiber Restricted  EDUCATION NEEDS: -No education needs identified at this time   Intake/Output Summary (Last 24 hours) at 02/07/13 1140 Last data filed at 02/07/13 0732  Gross per 24 hour  Intake    120 ml  Output    500 ml  Net   -380 ml    Labs:   Recent Labs Lab 02/05/13 1542 02/06/13 1356 02/06/13 1705 02/07/13 0510  NA 142 142  --  137  K 2.9* 3.4*  --  3.9  CL 106 102  --  105  CO2 25  --   --  24  BUN 7 4*  --  7  CREATININE 0.62 0.60 0.57 0.53  CALCIUM 9.3  --   --  8.9  MG  --   --   --  2.0  GLUCOSE 104* 92  --  103*    Scheduled Meds: . heparin  5,000 Units Subcutaneous Q8H  . levETIRAcetam  1,000 mg  Intravenous Q12H  . levothyroxine  137 mcg Oral QAC breakfast  . methadone  10 mg Oral Q8H  . metronidazole  500 mg Intravenous Q8H  . pantoprazole  40 mg Oral Q0600    Continuous Infusions: . sodium chloride 75 mL/hr at 02/07/13 0950    Past Medical History  Diagnosis Date  . Zoster   . Drug addict   . Heroin abuse   . Anxiety   . Hypothyroidism 01/26/2013  . Chest pain at rest   . Pneumonia     "twice" (01/26/2013)  . Chronic bronchitis     "get it q yr" (01/26/2013)  . Shortness of breath     "always; a little bit" (01/26/2013)  . Type I diabetes mellitus   . GERD (gastroesophageal reflux disease)   . History of stomach ulcers   . Seizures     "today was my second" (01/26/2013)  . Arthritis     "lower spine"  (01/26/2013)  . Chronic lower back pain     Past Surgical History  Procedure Laterality Date  . Cholecystectomy    . Dilation and curettage of uterus      "I've had 3" (01/26/2013)  . Multiple tooth extractions      "I've had all my teeth removed" (01/26/2013)   Jarold Motto MS, RD, LDN Pager: [phone] After-hours pager: [phone]

## 2013-02-07 NOTE — Consult Note (Addendum)
Reason for Consult: drug abuse Referring Physician: unknown  Tammy Winters is an 27 y.o. female.  HPI:    27 y.o. female seizures, opioid abuse/heroin abuse, anxiety, hypothyroidism who is incarcerated presenting to the emergency room with seizures. Patient was admitted 413 through 11/18/2012 with seizures which are felt to be secondary to methadone withdrawal. Patient stated that on discharge from last admission she was discharged on 40 mg of methadone however when she went back to methadone clinic and was increased to 95 mg daily. Patient has recently been incarcerated in jail since 01/23/2013 and hasn't received her methadone there. Patient has been maintained on the Keppra. One day prior to admission patient was noted to have some generalized muscle aches, cramping, nausea, vomiting and presented to the ED where she was subsequently discharged as it was felt it was pseudoseizures.     patient was noted to have multiple witnessed tonic-clonic seizures in jail. Patient was given 10 mg of oral Valium. Patient subsequently had a second witnessed seizure and was treated with intramuscular Ativan 2 mg. When paramedics arrived patient had another witnessed seizure and patient was given 2.5 mg of IV Versed. Patient subsequently presented to the ED.   Seen today. Was very sleepy. Admitted drug abuse. Showed some interest for drug rehab but told me that she will prefer to talk more tomorrow because she wants to do more rest now.   Past Medical History  Diagnosis Date  . Zoster   . Drug addict   . Heroin abuse   . Anxiety   . Hypothyroidism 01/26/2013  . Chest pain at rest   . Pneumonia     "twice" (01/26/2013)  . Chronic bronchitis     "get it q yr" (01/26/2013)  . Shortness of breath     "always; a little bit" (01/26/2013)  . Type I diabetes mellitus   . GERD (gastroesophageal reflux disease)   . History of stomach ulcers   . Seizures     "today was my second" (01/26/2013)  . Arthritis      "lower spine" (01/26/2013)  . Chronic lower back pain     Past Surgical History  Procedure Laterality Date  . Cholecystectomy    . Dilation and curettage of uterus      "I've had 3" (01/26/2013)  . Multiple tooth extractions      "I've had all my teeth removed" (01/26/2013)    Family History  Problem Relation Age of Onset  . Diabetes Mother   . Cancer Mother   . Diabetes Father   . Cancer Father     Social History:  reports that she has been smoking Cigarettes.  She has a 12 pack-year smoking history. She has never used smokeless tobacco. She reports that she uses illicit drugs (Marijuana and Heroin). She reports that she does not drink alcohol.  Allergies:  Allergies  Allergen Reactions  . Adhesive (Tape) Other (See Comments)    redness, burning, skin tears  . Reglan (Metoclopramide) Other (See Comments)    body twitching   . Zofran (Ondansetron Hcl) Other (See Comments)    Body twitching    Medications: I have reviewed the patient's current medications.  Results for orders placed during the hospital encounter of 02/05/13 (from the past 48 hour(s))  CBC WITH DIFFERENTIAL     Status: Abnormal   Collection Time    02/06/13  8:15 AM      Result Value Range   WBC 12.3 (*) 4.0 - 10.5  K/uL   RBC 4.42  3.87 - 5.11 MIL/uL   Hemoglobin 13.6  12.0 - 15.0 g/dL   HCT 01.0  27.2 - 53.6 %   MCV 88.0  78.0 - 100.0 fL   MCH 30.8  26.0 - 34.0 pg   MCHC 35.0  30.0 - 36.0 g/dL   RDW 64.4  03.4 - 74.2 %   Platelets 274  150 - 400 K/uL   Neutrophils Relative % 58  43 - 77 %   Neutro Abs 7.2  1.7 - 7.7 K/uL   Lymphocytes Relative 32  12 - 46 %   Lymphs Abs 3.9  0.7 - 4.0 K/uL   Monocytes Relative 9  3 - 12 %   Monocytes Absolute 1.1 (*) 0.1 - 1.0 K/uL   Eosinophils Relative 1  0 - 5 %   Eosinophils Absolute 0.1  0.0 - 0.7 K/uL   Basophils Relative 0  0 - 1 %   Basophils Absolute 0.0  0.0 - 0.1 K/uL  POCT I-STAT, CHEM 8     Status: Abnormal   Collection Time    02/06/13  1:56 PM       Result Value Range   Sodium 142  135 - 145 mEq/L   Potassium 3.4 (*) 3.5 - 5.1 mEq/L   Chloride 102  96 - 112 mEq/L   BUN 4 (*) 6 - 23 mg/dL   Creatinine, Ser 5.95  0.50 - 1.10 mg/dL   Glucose, Bld 92  70 - 99 mg/dL   Calcium, Ion 6.38  7.56 - 1.23 mmol/L   TCO2 26  0 - 100 mmol/L   Hemoglobin 13.6  12.0 - 15.0 g/dL   HCT 43.3  29.5 - 18.8 %  WET PREP, GENITAL     Status: Abnormal   Collection Time    02/06/13  2:54 PM      Result Value Range   Yeast Wet Prep HPF POC NONE SEEN  NONE SEEN   Trich, Wet Prep NONE SEEN  NONE SEEN   Clue Cells Wet Prep HPF POC FEW (*) NONE SEEN   WBC, Wet Prep HPF POC MODERATE (*) NONE SEEN  CBC     Status: Abnormal   Collection Time    02/06/13  5:05 PM      Result Value Range   WBC 11.3 (*) 4.0 - 10.5 K/uL   RBC 4.55  3.87 - 5.11 MIL/uL   Hemoglobin 14.0  12.0 - 15.0 g/dL   HCT 41.6  60.6 - 30.1 %   MCV 87.3  78.0 - 100.0 fL   MCH 30.8  26.0 - 34.0 pg   MCHC 35.3  30.0 - 36.0 g/dL   RDW 60.1  09.3 - 23.5 %   Platelets 280  150 - 400 K/uL  CREATININE, SERUM     Status: None   Collection Time    02/06/13  5:05 PM      Result Value Range   Creatinine, Ser 0.57  0.50 - 1.10 mg/dL   GFR calc non Af Amer >90  >90 mL/min   GFR calc Af Amer >90  >90 mL/min   Comment:            The eGFR has been calculated     using the CKD EPI equation.     This calculation has not been     validated in all clinical     situations.     eGFR's persistently     <90  mL/min signify     possible Chronic Kidney Disease.  HEMOGLOBIN A1C     Status: None   Collection Time    02/06/13  5:05 PM      Result Value Range   Hemoglobin A1C 5.0  <5.7 %   Comment: (NOTE)                                                                               According to the ADA Clinical Practice Recommendations for 2011, when     HbA1c is used as a screening test:      >=6.5%   Diagnostic of Diabetes Mellitus               (if abnormal result is confirmed)     5.7-6.4%    Increased risk of developing Diabetes Mellitus     References:Diagnosis and Classification of Diabetes Mellitus,Diabetes     Care,2011,34(Suppl 1):S62-S69 and Standards of Medical Care in             Diabetes - 2011,Diabetes Care,2011,34 (Suppl 1):S11-S61.   Mean Plasma Glucose 97  <117 mg/dL  BASIC METABOLIC PANEL     Status: Abnormal   Collection Time    02/07/13  5:10 AM      Result Value Range   Sodium 137  135 - 145 mEq/L   Potassium 3.9  3.5 - 5.1 mEq/L   Chloride 105  96 - 112 mEq/L   CO2 24  19 - 32 mEq/L   Glucose, Bld 103 (*) 70 - 99 mg/dL   BUN 7  6 - 23 mg/dL   Creatinine, Ser 9.81  0.50 - 1.10 mg/dL   Calcium 8.9  8.4 - 19.1 mg/dL   GFR calc non Af Amer >90  >90 mL/min   GFR calc Af Amer >90  >90 mL/min   Comment:            The eGFR has been calculated     using the CKD EPI equation.     This calculation has not been     validated in all clinical     situations.     eGFR's persistently     <90 mL/min signify     possible Chronic Kidney Disease.  CBC     Status: Abnormal   Collection Time    02/07/13  5:10 AM      Result Value Range   WBC 11.6 (*) 4.0 - 10.5 K/uL   RBC 4.17  3.87 - 5.11 MIL/uL   Hemoglobin 12.6  12.0 - 15.0 g/dL   HCT 47.8  29.5 - 62.1 %   MCV 87.5  78.0 - 100.0 fL   MCH 30.2  26.0 - 34.0 pg   MCHC 34.5  30.0 - 36.0 g/dL   RDW 30.8  65.7 - 84.6 %   Platelets 257  150 - 400 K/uL  MAGNESIUM     Status: None   Collection Time    02/07/13  5:10 AM      Result Value Range   Magnesium 2.0  1.5 - 2.5 mg/dL    Dg Chest 2 View  04/12/2951   *RADIOLOGY REPORT*  Clinical Data: Weakness and  emesis  CHEST - 2 VIEW  Comparison:  January 26, 2013  Findings: Lungs clear.  Heart size and pulmonary vascularity are normal.  No adenopathy.  No bone lesions.  IMPRESSION: No abnormality noted.   Original Report Authenticated By: Bretta Bang, M.D.   Ct Abdomen Pelvis W Contrast  02/06/2013   *RADIOLOGY REPORT*  Clinical Data: Rule out small bowel obstruction   CT ABDOMEN AND PELVIS WITH CONTRAST  Technique:  Multidetector CT imaging of the abdomen and pelvis was performed following the standard protocol during bolus administration of intravenous contrast.  Contrast: OMNIPAQUE IOHEXOL 300 MG/ML  SOLN  Comparison: None  Findings:  Lung bases are clear.  No pleural or pericardial effusion.  No focal liver abnormalities identified.  Prior cholecystectomy. Normal appearance of the pancreas.  The spleen is normal.  Normal appearance of the adrenal glands.  The right kidney is normal.  The left kidney is normal.  Urinary bladder is unremarkable.  The uterus and adnexal structures are unremarkable.  The stomach is normal.  The small bowel loops have a normal course and caliber.  No obstruction.  The appendix is visualized and appears unremarkable.  Normal appearance of the colon.  There is a small amount of free fluid within the right distal pericolic gutter and dependent portion of the pelvis.  The abdominal aorta has a normal caliber.  There is no upper abdominal adenopathy.  No pelvic or inguinal adenopathy noted.  Review of the visualized osseous structures is unremarkable.  IMPRESSION:  1. Small amount of free fluid within the abdomen and pelvis is nonspecific.  In a premenopausal female a small amount of pelvic fluid may be physiologic.   2. No evidence for small bowel obstruction.   Original Report Authenticated By: Signa Kell, M.D.    ROS Blood pressure 96/57, pulse 84, temperature 98 F (36.7 C), temperature source Oral, resp. rate 18, height 5\' 3"  (1.6 m), weight 61.406 kg (135 lb 6 oz), last menstrual period 01/19/2013, SpO2 99.00%. Physical Exam  Mental Status Examination/Evaluation:  Appearance: on bed sleepy  Eye Contact:: Good   Speech: normal   Volume: low  Mood: depressed   Affect: ristricted  Thought Process: organized   Orientation: Full   Thought Content: NO AVH  Suicidal Thoughts: No   Homicidal Thoughts: no  Memory: Recent; Poor    Judgement: Impaired   Insight: Lacking   Psychomotor Activity: Normal   Concentration: Fair   Recall: poor  Akathisia: No   Assessment:  AXIS I: Polysubstance Dep AXIS II: Deferred  AXIS III: see emdical hx ?  ? ?  ? ? ?  ? ? ?  ? ? ?  ? ? ?  AXIS IV: legal issues  AXIS V: 45 ? Treatment Plan/Recommendations:   1. will try to see her again tomorrow on her request.     Wonda Cerise 02/07/2013, 10:33 PM

## 2013-02-07 NOTE — Progress Notes (Signed)
Patient ID: Tammy Winters  female  WUJ:[id number]    DOB: May 03, 1986    DOA: 02/05/2013  PCP: No PCP Per Patient  Assessment/Plan: Principal Problem:   N&V (nausea and vomiting)/ diarrhea: Possible viral gastroenteritis versus methadone withdrawal - Currently improving, advance diet to low fiber - cont IV fluids, antiemetics, pain control, restarted on methadone  Active Problems:   Opiate/methadone withdrawal: patient asking for increased dose of methadone to 76 mg daily  - d/w Dr Dartha Lodge [phone]) at outpatient methadone clinic, recommended that her last dose of methadone was on 6/27, due to being in prison she has missed appointments and also lost methadone tolerance, she should not be started on 76 mg of methadone. Dr Christin Fudge agreed that 30mg  of methadone daily is good to avoid withdrawals. This should be increased on weekly basis. He also recommended that she make the arrangements to to receive outpatient [facility]adone at [facility] clinic or preferably residential detox at [facility].  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 Etiology - Follow cultures, rickettsial panel  Bacterial vaginosis: - placed on IV Flagyl. Ideally would have given Flagyl 2 g x1 however due to nausea vomiting and diarrhea she will not be able to tolerate it    suicidal ideation in the ED : Currently denies any active suicidal ideation, continue one-to-one sitter, psych consult called   DVT Prophylaxis:  Code Status:  Disposition: in 24-48hrs     Subjective: Nausea, vomiting, diarrhea improving, wants to upgrade diet, asking for more  methadone  Objective: Weight change:   Intake/Output Summary (Last 24 hours) at 02/07/13 0851 Last data filed at 02/07/13 0732  Gross per 24 hour  Intake    120 ml  Output    500 ml  Net   -380 ml   Blood pressure 92/58, pulse 50, temperature 98 F (36.7 C), temperature source Oral, resp. rate 20, height 5\' 3"  (1.6 m), weight 61.406 kg (135 lb 6 oz), last menstrual period 01/19/2013, SpO2 93.00%.  Physical Exam: General: Alert and awake, oriented x3, not in any acute distress. CVS: S1-S2 clear, no murmur rubs or gallops Chest: clear to auscultation bilaterally, no wheezing, rales or rhonchi Abdomen: soft minimal tenderness, nondistended, normal bowel sounds  Extremities: no cyanosis, clubbing or edema noted bilaterally Neuro: Cranial nerves II-XII intact, no focal neurological deficits Skin: Diffuse rash on the back and the neck som,e spots on the extremities   Lab Results: Basic Metabolic Panel:  Recent Labs Lab 02/05/13 1542 02/06/13 1356 02/06/13 1705 02/07/13 0510  NA 142 142  --  137  K 2.9* 3.4*  --  3.9  CL 106 102  --  105  CO2 25  --   --  24  GLUCOSE 104* 92  --  103*  BUN 7 4*  --  7  CREATININE 0.62 0.60 0.57 0.53  CALCIUM 9.3  --   --  8.9  MG  --   --   --  2.0   Liver Function Tests:  Recent Labs Lab 02/05/13 1542  AST 32  ALT 35  ALKPHOS 79  BILITOT 0.3  PROT 7.7  ALBUMIN 4.0    Recent Labs Lab 02/05/13 1542  LIPASE 68*   No results found for this basename: AMMONIA,  in the last 168 hours CBC:  Recent Labs Lab 02/06/13 0815  02/06/13 1705 02/07/13 0510  WBC 12.3*  --  11.3* 11.6*  NEUTROABS 7.2  --   --   --   HGB 13.6  < > 14.0 12.6  HCT 38.9  < > 39.7 36.5  MCV 88.0  --  87.3 87.5  PLT 274  --  280 257  < > = values in this interval not displayed. Cardiac Enzymes: No results found for this basename: CKTOTAL, CKMB, CKMBINDEX, TROPONINI,  in the last 168 hours BNP: No components found with this basename: POCBNP,  CBG: No  results found for this basename: GLUCAP,  in the last 168 hours   Micro Results: Recent Results (from the past 240 hour(s))  WET PREP, GENITAL     Status: Abnormal   Collection Time    02/06/13  2:54 PM      Result Value Range Status   Yeast Wet Prep HPF POC NONE SEEN  NONE SEEN Final   Trich, Wet Prep NONE SEEN  NONE SEEN Final   Clue Cells Wet Prep HPF POC FEW (*) NONE SEEN Final   WBC, Wet Prep HPF POC MODERATE (*) NONE SEEN Final    Studies/Results: Dg Chest 2 View  02/06/2013   *RADIOLOGY REPORT*  Clinical Data: Weakness and emesis  CHEST - 2 VIEW  Comparison:  January 26, 2013  Findings: Lungs clear.  Heart size and pulmonary vascularity are normal.  No adenopathy.  No bone lesions.  IMPRESSION: No abnormality noted.   Original Report Authenticated By: Bretta Bang, M.D.   Ct Head Wo Contrast  01/26/2013   *RADIOLOGY REPORT*  Clinical Data:  Altered mental status  CT HEAD WITHOUT CONTRAST  Technique:  Contiguous axial images were obtained from the base of the skull through the vertex without contrast  Comparison:  CT 11/16/2012  Findings:  The brain has a normal appearance without evidence for hemorrhage, acute infarction, hydrocephalus, or mass lesion.  There is no extra axial fluid collection.  The skull and paranasal sinuses are normal. Image quality degraded by motion.  Multiple images were repeated secondary to motion.  IMPRESSION: Normal CT of the head without contrast.   Original Report Authenticated By: Janeece Riggers, M.D.   Ct Abdomen Pelvis W Contrast  02/06/2013   *RADIOLOGY REPORT*  Clinical Data: Rule out small bowel obstruction  CT ABDOMEN AND PELVIS WITH CONTRAST  Technique:  Multidetector CT imaging of the abdomen and pelvis was performed following the standard protocol during bolus administration of intravenous contrast.  Contrast: OMNIPAQUE IOHEXOL 300 MG/ML  SOLN  Comparison: None  Findings:  Lung bases are clear.  No pleural or pericardial effusion.  No focal liver  abnormalities identified.  Prior cholecystectomy. Normal appearance of the pancreas.  The spleen is normal.  Normal appearance of the adrenal glands.  The right kidney is normal.  The left kidney is normal.  Urinary bladder is unremarkable.  The uterus and adnexal structures are unremarkable.  The stomach is normal.  The small bowel loops have a normal course and caliber.  No obstruction.  The appendix is visualized and appears unremarkable.  Normal appearance of the colon.  There is a small amount of free fluid within the right distal pericolic gutter and dependent portion of the pelvis.  The abdominal aorta has a  normal caliber.  There is no upper abdominal adenopathy.  No pelvic or inguinal adenopathy noted.  Review of the visualized osseous structures is unremarkable.  IMPRESSION:  1. Small amount of free fluid within the abdomen and pelvis is nonspecific.  In a premenopausal female a small amount of pelvic fluid may be physiologic.   2. No evidence for small bowel obstruction.   Original Report Authenticated By: Signa Kell, M.D.   Portable Chest 1 View  01/26/2013   *RADIOLOGY REPORT*  Clinical Data: Altered mental status.  Diffuse body aches.  PORTABLE CHEST - 1 VIEW  Comparison: 11/16/2012.  Findings: 1906 hours.  Interval extubation and removal of the nasogastric tube.  Heart size and mediastinal contours are stable. The lungs are clear.  There is no pleural effusion or pneumothorax. There is a mild thoracolumbar scoliosis which may be positional. Multiple telemetry leads overlie the chest.  IMPRESSION: No active cardiopulmonary process demonstrated.   Original Report Authenticated By: Carey Bullocks, M.D.    Medications: Scheduled Meds: . heparin  5,000 Units Subcutaneous Q8H  . levETIRAcetam  1,000 mg Intravenous Q12H  . levothyroxine  137 mcg Oral QAC breakfast  . methadone  10 mg Oral Q8H  . metronidazole  500 mg Intravenous Q8H  . pantoprazole  40 mg Oral Q0600      LOS: 2 days    RAI,RIPUDEEP M.D. [facility] 02/07/2013, 8:51 AM Pager: [phone]  If 7PM-7AM, please contact night-coverage [email or web address] Password [email or web address]

## 2013-02-08 LAB — GC/CHLAMYDIA PROBE AMP
CT Probe RNA: NEGATIVE
GC Probe RNA: NEGATIVE

## 2013-02-08 LAB — ROCKY MTN SPOTTED FVR AB, IGG-BLOOD: RMSF IgG: 0.21 IV

## 2013-02-08 LAB — TSH: TSH: 2.253 u[IU]/mL (ref 0.350–4.500)

## 2013-02-08 LAB — URINE CULTURE

## 2013-02-08 LAB — ROCKY MTN SPOTTED FVR AB, IGM-BLOOD: RMSF IgM: 0.28 IV (ref 0.00–0.89)

## 2013-02-08 MED ORDER — WHITE PETROLATUM GEL
Status: AC
  Administered 2013-02-08: 12:00:00
  Filled 2013-02-08: qty 5

## 2013-02-08 MED ORDER — LEVETIRACETAM 500 MG PO TABS
1000.0000 mg | ORAL_TABLET | Freq: Two times a day (BID) | ORAL | Status: DC
  Administered 2013-02-08 – 2013-02-09 (×2): 1000 mg via ORAL
  Filled 2013-02-08 (×3): qty 2

## 2013-02-08 MED ORDER — NICOTINE 21 MG/24HR TD PT24
21.0000 mg | MEDICATED_PATCH | Freq: Every day | TRANSDERMAL | Status: DC
  Administered 2013-02-09: 21 mg via TRANSDERMAL
  Filled 2013-02-08 (×2): qty 1

## 2013-02-08 NOTE — Progress Notes (Signed)
Patient ID: Tammy Winters  female  WGN:[id number]    DOB: September 29, 1985    DOA: 02/05/2013  PCP: No PCP Per Patient  Assessment/Plan: Principal Problem:   N&V (nausea and vomiting)/ diarrhea: Possible viral gastroenteritis versus methadone withdrawal- resolved - cont IV fluids, antiemetics, pain control, restarted on methadone - Restarted solids  Active Problems:   Opiate/methadone withdrawal:  - d/w Dr Dartha Lodge [phone]) at outpatient methadone clinic, recommended that her last dose of methadone was on 6/27, due to being in prison she has missed appointments and also lost methadone tolerance, she should not be started on 76 mg of methadone. Dr Christin Fudge agreed that 30mg  of methadone daily is good to avoid withdrawals. This should be increased on weekly basis. He also recommended that she make the arrangements to to receive outpatient methadone at [facility] clinic or preferably residential detox at [facility].  - Patient did not want detox during the previous admission, however she is somewhat agreeable now - Placed psychiatry consult to expedite the process however she refused to talk to psychiatry yesterday. Psychiatry will see her today.     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 Etiology - Follow cultures, rickettsial panel in process  Bacterial vaginosis: - placed on IV Flagyl. Ideally would have given Flagyl 2 g x1 however due to nausea vomiting, she will not be able to tolerate it    suicidal ideation in the ED : Currently denies any active suicidal ideation, continue one-to-one sitter, psych consult pending   DVT Prophylaxis:  Code Status:  Disposition: Awaiting psychiatry and safe disposition. Patient cannot return back to the previous methadone clinic. She will need to be accepted at [facility] clinic to  obtain methadone (please confirm that patient is accepted before discharge) or residential detox ([facility]), psych to expedite [facility] if she agrees.    Subjective: Nausea, vomiting, diarrhea resolved, tolerating solid diet  Objective: Weight change: 0 kg (0 lb)  Intake/Output Summary (Last 24 hours) at 02/08/13 0934 Last data filed at 02/08/13 0558  Gross per 24 hour  Intake 4582.5 ml  Output   1000 ml  Net 3582.5 ml   Blood pressure 106/69, pulse 80, temperature 97.8 F (36.6 C), temperature source Oral, resp. rate 18, height 5\' 3"  (1.6 m), weight 61.406 kg (135 lb 6 oz), last menstrual period 01/19/2013, SpO2 100.00%.  Physical Exam: General: Alert and awake, oriented x3, NAD, looks better today CVS: S1-S2 clear, no murmur rubs or gallops Chest: CTAB Abdomen: soft minimal tenderness, nondistended, normal bowel sounds  Extremities: no c/c/e bilaterally Skin: Diffuse rash on the back and the neck, some spots on the extremities, slightly better   Lab Results: Basic Metabolic Panel:  Recent Labs Lab 02/05/13 1542 02/06/13 1356 02/06/13 1705 02/07/13 0510  NA 142 142  --  137  K 2.9* 3.4*  --  3.9  CL 106 102  --  105  CO2 25  --   --  24  GLUCOSE 104* 92  --  103*  BUN 7 4*  --  7  CREATININE 0.62 0.60 0.57 0.53  CALCIUM 9.3  --   --  8.9  MG  --   --   --  2.0   Liver Function Tests:  Recent Labs Lab 02/05/13 1542  AST 32  ALT 35  ALKPHOS 79  BILITOT 0.3  PROT 7.7  ALBUMIN 4.0    Recent Labs Lab 02/05/13 1542  LIPASE 68*   No results found for this basename: AMMONIA,  in the last 168 hours CBC:  Recent Labs Lab 02/06/13 0815  02/06/13 1705 02/07/13 0510  WBC 12.3*  --  11.3* 11.6*  NEUTROABS 7.2  --   --   --   HGB 13.6  < > 14.0 12.6  HCT 38.9  < > 39.7 36.5  MCV 88.0  --  87.3 87.5  PLT 274  --  280 257  < > = values in this interval not displayed. Cardiac Enzymes: No results found for this basename: CKTOTAL, CKMB, CKMBINDEX, TROPONINI,  in  the last 168 hours BNP: No components found with this basename: POCBNP,  CBG: No results found for this basename: GLUCAP,  in the last 168 hours   Micro Results: Recent Results (from the past 240 hour(s))  WET PREP, GENITAL     Status: Abnormal   Collection Time    02/06/13  2:54 PM      Result Value Range Status   Yeast Wet Prep HPF POC NONE SEEN  NONE SEEN Final   Trich, Wet Prep NONE SEEN  NONE SEEN Final   Clue Cells Wet Prep HPF POC FEW (*) NONE SEEN Final   WBC, Wet Prep HPF POC MODERATE (*) NONE SEEN Final    Studies/Results: Dg Chest 2 View  02/06/2013   *RADIOLOGY REPORT*  Clinical Data: Weakness and emesis  CHEST - 2 VIEW  Comparison:  January 26, 2013  Findings: Lungs clear.  Heart size and pulmonary vascularity are normal.  No adenopathy.  No bone lesions.  IMPRESSION: No abnormality noted.   Original Report Authenticated By: Bretta Bang, M.D.   Ct Head Wo Contrast  01/26/2013   *RADIOLOGY REPORT*  Clinical Data:  Altered mental status  CT HEAD WITHOUT CONTRAST  Technique:  Contiguous axial images were obtained from the base of the skull through the vertex without contrast  Comparison:  CT 11/16/2012  Findings:  The brain has a normal appearance without evidence for hemorrhage, acute infarction, hydrocephalus, or mass lesion.  There is no extra axial fluid collection.  The skull and paranasal sinuses are normal. Image quality degraded by motion.  Multiple images were repeated secondary to motion.  IMPRESSION: Normal CT of the head without contrast.   Original Report Authenticated By: Janeece Riggers, M.D.   Ct Abdomen Pelvis W Contrast  02/06/2013   *RADIOLOGY REPORT*  Clinical Data: Rule out small bowel obstruction  CT ABDOMEN AND PELVIS WITH CONTRAST  Technique:  Multidetector CT imaging of the abdomen and pelvis was performed following the standard protocol during bolus administration of intravenous contrast.  Contrast: OMNIPAQUE IOHEXOL 300 MG/ML  SOLN  Comparison: None   Findings:  Lung bases are clear.  No pleural or pericardial effusion.  No focal liver abnormalities identified.  Prior cholecystectomy. Normal appearance of the pancreas.  The spleen is normal.  Normal appearance of the adrenal glands.  The right kidney is normal.  The left kidney is normal.  Urinary bladder is unremarkable.  The uterus and adnexal structures are unremarkable.  The stomach is normal.  The small bowel loops have a normal course and caliber.  No obstruction.  The appendix is visualized and appears unremarkable.  Normal appearance of the colon.  There is a small amount of free fluid within the right distal pericolic gutter and dependent portion of the pelvis.  The abdominal aorta has a normal caliber.  There is no upper abdominal adenopathy.  No pelvic or inguinal adenopathy noted.  Review of the visualized osseous structures is unremarkable.  IMPRESSION:  1. Small amount of free fluid within the abdomen and pelvis is nonspecific.  In a premenopausal female a small amount of pelvic fluid may be physiologic.   2. No evidence for small bowel obstruction.   Original Report Authenticated By: Signa Kell, M.D.   Portable Chest 1 View  01/26/2013   *RADIOLOGY REPORT*  Clinical Data: Altered mental status.  Diffuse body aches.  PORTABLE CHEST - 1 VIEW  Comparison: 11/16/2012.  Findings: 1906 hours.  Interval extubation and removal of the nasogastric tube.  Heart size and mediastinal contours are stable. The lungs are clear.  There is no pleural effusion or pneumothorax. There is a mild thoracolumbar scoliosis which may be positional. Multiple telemetry leads overlie the chest.  IMPRESSION: No active cardiopulmonary process demonstrated.   Original Report Authenticated By: Carey Bullocks, M.D.    Medications: Scheduled Meds: . feeding supplement  1 Container Oral BID BM  . heparin  5,000 Units Subcutaneous Q8H  . levETIRAcetam  1,000 mg Intravenous Q12H  . levothyroxine  137 mcg Oral QAC breakfast   . methadone  10 mg Oral Q8H  . metronidazole  500 mg Intravenous Q8H  . nicotine  21 mg Transdermal Daily  . pantoprazole  40 mg Oral Q0600      LOS: 3 days   Shalina Norfolk M.D. [facility] 02/08/2013, 9:34 AM Pager: [phone]  If 7PM-7AM, please contact night-coverage [email or web address] Password [email or web address]

## 2013-02-08 NOTE — Consult Note (Signed)
Reason for Consult: drug abuse Referring Physician: unknown  Tammy Winters is an 27 y.o. female.  HPI:    27 y.o. female seizures, opioid abuse/heroin abuse, anxiety, hypothyroidism who is incarcerated presenting to the emergency room with seizures. Patient was admitted 413 through 11/18/2012 with seizures which are felt to be secondary to methadone withdrawal. Patient stated that on discharge from last admission she was discharged on 40 mg of methadone however when she went back to methadone clinic and was increased to 95 mg daily. Patient has recently been incarcerated in jail since 01/23/2013 and hasn't received her methadone there. Patient has been maintained on the Keppra. One day prior to admission patient was noted to have some generalized muscle aches, cramping, nausea, vomiting and presented to the ED where she was subsequently discharged as it was felt it was pseudoseizures.    Interval Hx:  Seen today and her fiancee was there with her permission. less sleepy today. Admitted drug abuse. Not interested for drug rehab now not interested for [facility] either. Wants to follow with methadone clinic. Per pt she is not interested to get any much help with psy at this time other than may be follow up care plan for methadone clinic. Denies any SI now or when came. Her fiancee not concerned for her safety either.   Past Medical History  Diagnosis Date  . Zoster   . Drug addict   . Heroin abuse   . Anxiety   . Hypothyroidism 01/26/2013  . Chest pain at rest   . Pneumonia     "twice" (01/26/2013)  . Chronic bronchitis     "get it q yr" (01/26/2013)  . Shortness of breath     "always; a little bit" (01/26/2013)  . Type I diabetes mellitus   . GERD (gastroesophageal reflux disease)   . History of stomach ulcers   . Seizures     "today was my second" (01/26/2013)  . Arthritis     "lower spine" (01/26/2013)  . Chronic lower back pain     Past Surgical History  Procedure Laterality Date  .  Cholecystectomy    . Dilation and curettage of uterus      "I've had 3" (01/26/2013)  . Multiple tooth extractions      "I've had all my teeth removed" (01/26/2013)    Family History  Problem Relation Age of Onset  . Diabetes Mother   . Cancer Mother   . Diabetes Father   . Cancer Father     Social History:  reports that she has been smoking Cigarettes.  She has a 12 pack-year smoking history. She has never used smokeless tobacco. She reports that she uses illicit drugs (Marijuana and Heroin). She reports that she does not drink alcohol.  Allergies:  Allergies  Allergen Reactions  . Adhesive (Tape) Other (See Comments)    redness, burning, skin tears  . Reglan (Metoclopramide) Other (See Comments)    body twitching   . Zofran (Ondansetron Hcl) Other (See Comments)    Body twitching    Medications: I have reviewed the patient's current medications.  Results for orders placed during the hospital encounter of 02/05/13 (from the past 48 hour(s))  BASIC METABOLIC PANEL     Status: Abnormal   Collection Time    02/07/13  5:10 AM      Result Value Range   Sodium 137  135 - 145 mEq/L   Potassium 3.9  3.5 - 5.1 mEq/L   Chloride 105  96 - 112 mEq/L   CO2 24  19 - 32 mEq/L   Glucose, Bld 103 (*) 70 - 99 mg/dL   BUN 7  6 - 23 mg/dL   Creatinine, Ser 1.61  0.50 - 1.10 mg/dL   Calcium 8.9  8.4 - 09.6 mg/dL   GFR calc non Af Amer >90  >90 mL/min   GFR calc Af Amer >90  >90 mL/min   Comment:            The eGFR has been calculated     using the CKD EPI equation.     This calculation has not been     validated in all clinical     situations.     eGFR's persistently     <90 mL/min signify     possible Chronic Kidney Disease.  CBC     Status: Abnormal   Collection Time    02/07/13  5:10 AM      Result Value Range   WBC 11.6 (*) 4.0 - 10.5 K/uL   RBC 4.17  3.87 - 5.11 MIL/uL   Hemoglobin 12.6  12.0 - 15.0 g/dL   HCT 04.5  40.9 - 81.1 %   MCV 87.5  78.0 - 100.0 fL   MCH 30.2   26.0 - 34.0 pg   MCHC 34.5  30.0 - 36.0 g/dL   RDW 91.4  78.2 - 95.6 %   Platelets 257  150 - 400 K/uL  MAGNESIUM     Status: None   Collection Time    02/07/13  5:10 AM      Result Value Range   Magnesium 2.0  1.5 - 2.5 mg/dL  URINE CULTURE     Status: None   Collection Time    02/07/13 10:08 AM      Result Value Range   Specimen Description URINE, CLEAN CATCH     Special Requests NONE     Culture  Setup Time 02/07/2013 19:27     Colony Count PENDING     Culture Culture reincubated for better growth     Report Status PENDING      No results found.  ROS  Blood pressure 103/61, pulse 92, temperature 99.2 F (37.3 C), temperature source Oral, resp. rate 18, height 5\' 3"  (1.6 m), weight 61.406 kg (135 lb 6 oz), last menstrual period 01/19/2013, SpO2 100.00%. Physical Exam   Mental Status Examination/Evaluation:  Appearance: on bed calm  Eye Contact:: Good   Speech: normal   Volume: low  Mood: depressed   Affect: ristricted  Thought Process: organized   Orientation: Full   Thought Content: NO AVH  Suicidal Thoughts: No   Homicidal Thoughts: no  Memory: Recent; Poor   Judgement: Impaired   Insight: Lacking   Psychomotor Activity: Normal   Concentration: Fair   Recall: poor  Akathisia: No   Assessment:  AXIS I: Polysubstance Dep AXIS II: Deferred  AXIS III: see emdical hx ?  ? ?  ? ? ?  ? ? ?  ? ? ?  ? ? ?  AXIS IV: legal issues  AXIS V: 50 ? Treatment Plan/Recommendations:   1. Pt is not interested to get much help from psy at this time. plz call Psy SW if help needed for any follow up care  2. Will benefit form drug rehab and out pt psy follow up after discharge. Pt and her fiancee feels safe if she goes home from here  3. Will sign off att  this time. thanks     Wonda Cerise 02/08/2013, 8:01 PM

## 2013-02-09 MED ORDER — GUAIFENESIN-DM 100-10 MG/5ML PO SYRP: 5.0000 mL | ORAL_SOLUTION | ORAL | Status: DC | PRN

## 2013-02-09 MED ORDER — NICOTINE 21 MG/24HR TD PT24: 1.0000 | MEDICATED_PATCH | TRANSDERMAL | Status: DC

## 2013-02-09 MED ORDER — ESOMEPRAZOLE MAGNESIUM 40 MG PO CPDR: 40.0000 mg | DELAYED_RELEASE_CAPSULE | Freq: Every day | ORAL | Status: DC

## 2013-02-09 MED ORDER — METRONIDAZOLE 500 MG PO TABS: 500.0000 mg | ORAL_TABLET | Freq: Three times a day (TID) | ORAL | Status: AC

## 2013-02-09 MED ORDER — METHADONE HCL 10 MG PO TABS: 10.0000 mg | ORAL_TABLET | Freq: Three times a day (TID) | ORAL | Status: DC

## 2013-02-09 MED ORDER — HYDROXYZINE HCL 25 MG PO TABS: 25.0000 mg | ORAL_TABLET | Freq: Four times a day (QID) | ORAL | Status: DC | PRN

## 2013-02-09 NOTE — Progress Notes (Signed)
Discussed discharge instructions and medications with pt. IV removed. Pt discharged to home with fiance (pt interested in [facility], but admission process can take weeks, and per Child psychotherapist, she cannot stay here simply for OP rehab placement. OP rehab options given to pt by Child psychotherapist. Pt can follow-up on her own if desired). Belongings and bus pass given to pt. Assessment unchanged from morning. PM dose of Methadone given early per Dr. Susie Cassette. Pt given instructions to go to Methadone clinic first thing in the morning with her discharge instructions, in order to obtain the medication. Per SW and case management, pt has to follow-up on this herself. If her particular methadone clinic refuses to accept her back, she must go to a different [facility] to obtain the medication. Pt given phone number for health and wellness clinic to schedule follow-up appointment.

## 2013-02-09 NOTE — Care Management Note (Signed)
   CARE MANAGEMENT NOTE 02/09/2013  Patient:  ISADORE, BOKHARI   Account Number:  [id number]  Date Initiated:  02/09/2013  Documentation initiated by:  Reinhardt Licausi  Subjective/Objective Assessment:   Met with pt re followup care and PCP, offered to make pt an appointment at [facility] and [facility]. Pt agreeable.     Action/Plan:   Met with pt and Center called for an appointment for this pt. This pt also asked for assistance in going to an inpatient rehab center. CSW , Genelle Bal notified and will assist.   Anticipated DC Date:  02/09/2013   Anticipated DC Plan:  HOME/SELF CARE      DC Planning Services  Follow-up appt scheduled      Choice offered to / List presented to:             Status of service:  Completed, signed off Medicare Important Message given?   (If response is "NO", the following Medicare IM given date fields will be blank) Date Medicare IM given:   Date Additional Medicare IM given:    Discharge Disposition:  HOME/SELF CARE  Per UR Regulation:    If discussed at Long Length of Stay Meetings, dates discussed:    Comments:  02/09/2013 Met with pt re followup care with PCP. Pt has no PCP and agreed to having CM arrange an appointment at [facility] and [facility]. This CM place a call to that center, awaiting a return call.

## 2013-02-09 NOTE — ED Provider Notes (Signed)
Medical screening examination/treatment/procedure(s) were performed by non-physician practitioner and as supervising physician I was immediately available for consultation/collaboration.  Flint Melter, MD 02/09/13 2142

## 2013-02-09 NOTE — Discharge Summary (Signed)
Physician Discharge Summary  Tammy Winters MRN: [id number] DOB/AGE: 04-Oct-1985 27 y.o.  PCP: No PCP Per Patient   Admit date: 02/05/2013 Discharge date: 02/09/2013  Discharge Diagnoses:      N&V (nausea and vomiting) Active Problems:   Opiate withdrawal   Hypothyroidism   Anxiety   Pseudoseizures   Hypokalemia   Maculopapular rash, generalized     Medication List    STOP taking these medications       diazepam 5 MG tablet  Commonly known as:  VALIUM     ibuprofen 600 MG tablet  Commonly known as:  ADVIL,MOTRIN      TAKE these medications       acetaminophen 325 MG tablet  Commonly known as:  TYLENOL  Take 650 mg by mouth 3 (three) times daily as needed for pain.     esomeprazole 40 MG capsule  Commonly known as:  NEXIUM  Take 1 capsule (40 mg total) by mouth daily before breakfast.     Fish Oil 1000 MG Caps  Take 2 capsules by mouth daily.     guaiFENesin-dextromethorphan 100-10 MG/5ML syrup  Commonly known as:  ROBITUSSIN DM  Take 5 mLs by mouth every 4 (four) hours as needed for cough.     hydrOXYzine 25 MG tablet  Commonly known as:  ATARAX/VISTARIL  Take 1 tablet (25 mg total) by mouth every 6 (six) hours as needed for anxiety.     levETIRAcetam 1000 MG tablet  Commonly known as:  KEPPRA  Take 1 tablet (1,000 mg total) by mouth 2 (two) times daily.     levothyroxine 137 MCG tablet  Commonly known as:  SYNTHROID, LEVOTHROID  Take 1 tablet (137 mcg total) by mouth daily before breakfast.     LORazepam 0.5 MG tablet  Commonly known as:  ATIVAN  Take 0.5 mg by mouth every 8 (eight) hours.     methadone 10 MG tablet  Commonly known as:  DOLOPHINE  Take 1 tablet (10 mg total) by mouth every 8 (eight) hours.     metroNIDAZOLE 500 MG tablet  Commonly known as:  FLAGYL  Take 1 tablet (500 mg total) by mouth 3 (three) times daily.     nicotine 21 mg/24hr patch  Commonly known as:  NICODERM CQ - dosed in mg/24 hours  Place 1 patch onto the  skin daily.        Discharge Condition: Stable   Disposition: [facility] Institution Not Defined   Consults:  Psychiatric  Significant Diagnostic Studies: Dg Chest 2 View  02/06/2013   *RADIOLOGY REPORT*  Clinical Data: Weakness and emesis  CHEST - 2 VIEW  Comparison:  January 26, 2013  Findings: Lungs clear.  Heart size and pulmonary vascularity are normal.  No adenopathy.  No bone lesions.  IMPRESSION: No abnormality noted.   Original Report Authenticated By: Bretta Bang, M.D.   Ct Head Wo Contrast  01/26/2013   *RADIOLOGY REPORT*  Clinical Data:  Altered mental status  CT HEAD WITHOUT CONTRAST  Technique:  Contiguous axial images were obtained from the base of the skull through the vertex without contrast  Comparison:  CT 11/16/2012  Findings:  The brain has a normal appearance without evidence for hemorrhage, acute infarction, hydrocephalus, or mass lesion.  There is no extra axial fluid collection.  The skull and paranasal sinuses are normal. Image quality degraded by motion.  Multiple images were repeated secondary to motion.  IMPRESSION: Normal CT of the head without  contrast.   Original Report Authenticated By: Janeece Riggers, M.D.   Ct Abdomen Pelvis W Contrast  02/06/2013   *RADIOLOGY REPORT*  Clinical Data: Rule out small bowel obstruction  CT ABDOMEN AND PELVIS WITH CONTRAST  Technique:  Multidetector CT imaging of the abdomen and pelvis was performed following the standard protocol during bolus administration of intravenous contrast.  Contrast: OMNIPAQUE IOHEXOL 300 MG/ML  SOLN  Comparison: None  Findings:  Lung bases are clear.  No pleural or pericardial effusion.  No focal liver abnormalities identified.  Prior cholecystectomy. Normal appearance of the pancreas.  The spleen is normal.  Normal appearance of the adrenal glands.  The right kidney is normal.  The left kidney is normal.  Urinary bladder is unremarkable.  The uterus and adnexal structures are  unremarkable.  The stomach is normal.  The small bowel loops have a normal course and caliber.  No obstruction.  The appendix is visualized and appears unremarkable.  Normal appearance of the colon.  There is a small amount of free fluid within the right distal pericolic gutter and dependent portion of the pelvis.  The abdominal aorta has a normal caliber.  There is no upper abdominal adenopathy.  No pelvic or inguinal adenopathy noted.  Review of the visualized osseous structures is unremarkable.  IMPRESSION:  1. Small amount of free fluid within the abdomen and pelvis is nonspecific.  In a premenopausal female a small amount of pelvic fluid may be physiologic.   2. No evidence for small bowel obstruction.   Original Report Authenticated By: Signa Kell, M.D.   Portable Chest 1 View  01/26/2013   *RADIOLOGY REPORT*  Clinical Data: Altered mental status.  Diffuse body aches.  PORTABLE CHEST - 1 VIEW  Comparison: 11/16/2012.  Findings: 1906 hours.  Interval extubation and removal of the nasogastric tube.  Heart size and mediastinal contours are stable. The lungs are clear.  There is no pleural effusion or pneumothorax. There is a mild thoracolumbar scoliosis which may be positional. Multiple telemetry leads overlie the chest.  IMPRESSION: No active cardiopulmonary process demonstrated.   Original Report Authenticated By: Carey Bullocks, M.D.      Microbiology: Recent Results (from the past 240 hour(s))  URINE CULTURE     Status: None   Collection Time    02/05/13 10:08 PM      Result Value Range Status   Specimen Description URINE, CLEAN CATCH   Final   Special Requests CX ADDED AT 2249 ON [id number]   Final   Culture  Setup Time 02/05/2013 23:21   Final   Colony Count 10,000 COLONIES/ML   Final   Culture     Final   Value: Multiple bacterial morphotypes present, none predominant. Suggest appropriate recollection if clinically indicated.   Report Status 02/08/2013 FINAL   Final  GC/CHLAMYDIA PROBE  AMP     Status: None   Collection Time    02/06/13  2:54 PM      Result Value Range Status   CT Probe RNA NEGATIVE  NEGATIVE Final   GC Probe RNA NEGATIVE  NEGATIVE Final   Comment: (NOTE)                                                                                              *  Normal Reference Range: Negative*          Assay performed using the Gen-Probe APTIMA COMBO2 (R) Assay.     Acceptable specimen types for this assay include APTIMA Swabs (Unisex,     endocervical, urethral, or vaginal), first void urine, and ThinPrep     liquid based cytology samples.  WET PREP, GENITAL     Status: Abnormal   Collection Time    02/06/13  2:54 PM      Result Value Range Status   Yeast Wet Prep HPF POC NONE SEEN  NONE SEEN Final   Trich, Wet Prep NONE SEEN  NONE SEEN Final   Clue Cells Wet Prep HPF POC FEW (*) NONE SEEN Final   WBC, Wet Prep HPF POC MODERATE (*) NONE SEEN Final  CULTURE, BLOOD (ROUTINE X 2)     Status: None   Collection Time    02/06/13  3:30 PM      Result Value Range Status   Specimen Description BLOOD RIGHT ARM   Final   Special Requests BOTTLES DRAWN AEROBIC AND ANAEROBIC 10CC   Final   Culture  Setup Time 02/06/2013 23:10   Final   Culture     Final   Value:        BLOOD CULTURE RECEIVED NO GROWTH TO DATE CULTURE WILL BE HELD FOR 5 DAYS BEFORE ISSUING A FINAL NEGATIVE REPORT   Report Status PENDING   Incomplete  CULTURE, BLOOD (ROUTINE X 2)     Status: None   Collection Time    02/06/13  3:45 PM      Result Value Range Status   Specimen Description BLOOD LEFT HAND   Final   Special Requests BOTTLES DRAWN AEROBIC AND ANAEROBIC 10CC   Final   Culture  Setup Time 02/06/2013 23:09   Final   Culture     Final   Value:        BLOOD CULTURE RECEIVED NO GROWTH TO DATE CULTURE WILL BE HELD FOR 5 DAYS BEFORE ISSUING A FINAL NEGATIVE REPORT   Report Status PENDING   Incomplete  URINE CULTURE     Status: None   Collection Time    02/07/13 10:08 AM      Result Value Range  Status   Specimen Description URINE, CLEAN CATCH   Final   Special Requests NONE   Final   Culture  Setup Time 02/07/2013 19:27   Final   Colony Count PENDING   Incomplete   Culture Culture reincubated for better growth   Final   Report Status PENDING   Incomplete     Labs: Results for orders placed during the hospital encounter of 02/05/13 (from the past 48 hour(s))  URINE CULTURE     Status: None   Collection Time    02/07/13 10:08 AM      Result Value Range   Specimen Description URINE, CLEAN CATCH     Special Requests NONE     Culture  Setup Time 02/07/2013 19:27     Colony Count PENDING     Culture Culture reincubated for better growth     Report Status PENDING       HPI : 27 y.o. female seizures, opioid abuse/heroin abuse, anxiety, hypothyroidism who is incarcerated presenting to the emergency room with seizures. Patient was admitted 413 through 11/18/2012 with seizures which are felt to be secondary to methadone withdrawal. Patient stated that on discharge from last admission she was discharged on 40 mg of methadone  however when she went back to methadone clinic and was increased to 95 mg daily. Patient has recently been incarcerated in jail since 01/23/2013 and hasn't received her methadone there. Patient has been maintained on the Keppra. One day prior to admission patient was noted to have some generalized muscle aches, cramping, nausea, vomiting and presented to the ED where she was subsequently discharged as it was felt it was pseudoseizures.  patient was noted to have multiple witnessed tonic-clonic seizures in jail. Patient was given 10 mg of oral Valium. Patient subsequently had a second witnessed seizure and was treated with intramuscular Ativan 2 mg. When paramedics arrived patient had another witnessed seizure and patient was given 2.5 mg of IV Versed. Patient subsequently presented to the ED.      HOSPITAL COURSE:  N&V (nausea and vomiting)/ diarrhea: Possible viral  gastroenteritis versus methadone withdrawal- resolved  -Treated with IV fluids, antiemetics, pain control, restarted on methadone  - Restarted solids     Opiate/methadone withdrawal:  - d/w Dr Dartha Lodge [phone]) at outpatient methadone clinic, recommended that her last dose of methadone was on 6/27, due to being in prison she has missed appointments and also lost methadone tolerance, she should not be started on 76 mg of methadone. Dr Christin Fudge agreed that 30mg  of methadone daily is good to avoid withdrawals. This should be increased on weekly basis. He also recommended that she make the arrangements to to receive outpatient methadone at [facility] clinic or preferably residential detox at [facility].  - Patient did not want detox during the previous admission,  Now  Interested in drug rehab at  [facility] , Child psychotherapist aware      Hypothyroidism:  - Continue Synthroid   Anxiety: cont ativan PRN   History of Pseudoseizures vs seizures versus methadone withdrawal  -Continue methadone, continue Keppra    Maculopapular rash, generalized: ? Etiology  - Follow cultures, rickettsial panel in process  Negative for [facility] spotted fever Blood culture no growth so far   Bacterial vaginosis:  - placed on IV Flagyl. Ideally would have given Flagyl 2 g x1 however due to nausea vomiting, she will not be able to tolerate it   suicidal ideation in the ED : Currently denies any active suicidal ideation, dis continue one-to-one sitter, psych consult does not recommend involuntary commitment  Will benefit form drug rehab and out pt psy follow up after discharge. Pt and her fiancee feels safe if she goes home from here     DVT Prophylaxis:  Code Status:  Disposition: Awaiting psychiatry and safe disposition. Patient cannot return back to the previous methadone clinic. She will need to be accepted at [facility] clinic to obtain methadone (please confirm that patient is accepted before  discharge) or residential detox ([facility]),          Discharge Exam: *  Blood pressure 109/74, pulse 63, temperature 97.5 F (36.4 C), temperature source Oral, resp. rate 18, height 5\' 3"  (1.6 m), weight 61.406 kg (135 lb 6 oz), last menstrual period 01/19/2013, SpO2 100.00%.  General: Alert and awake, oriented x3, NAD, looks better today  CVS: S1-S2 clear, no murmur rubs or gallops  Chest: CTAB  Abdomen: soft minimal tenderness, nondistended, normal bowel sounds  Extremities: no c/c/e bilaterally  Skin: Diffuse rash on the back and the neck, some spots on the extremities, slightly better          Follow-up Information   Follow up with [facility] EMERGENCY DEPARTMENT. (If  symptoms worsen)    Contact information:   [address] [id number] [place] [address] [phone]      Signed: Richarda Overlie 02/09/2013, 8:03 AM

## 2013-02-09 NOTE — ED Provider Notes (Signed)
Medical screening examination/treatment/procedure(s) were performed by non-physician practitioner and as supervising physician I was immediately available for consultation/collaboration.  Querida Beretta L Calistro Rauf, MD 02/09/13 2142 

## 2013-02-10 LAB — URINE CULTURE

## 2013-02-12 LAB — CULTURE, BLOOD (ROUTINE X 2)
Culture: NO GROWTH
Culture: NO GROWTH

## 2013-02-12 LAB — EHRLICHIA ANTIBODY PANEL: E chaffeensis (HGE) Ab, IgM: <1:20 {titer}

## 2013-02-19 NOTE — Clinical Social Work Note (Signed)
LATE ENTRY FOR 02/09/13 - Patient discharged on 7/7 and requested and was given 2 bus passes.   Genelle Bal, MSW, LCSW [phone]

## 2013-02-28 ENCOUNTER — Observation Stay (HOSPITAL_COMMUNITY)
Admission: EM | Admit: 2013-02-28 | Discharge: 2013-03-01 | Disposition: A | Discharge status: Home / Self Care | Payer: MEDICAID | Attending: Internal Medicine | Admitting: Internal Medicine

## 2013-02-28 ENCOUNTER — Encounter (HOSPITAL_COMMUNITY): Payer: Self-pay | Admitting: Student

## 2013-02-28 DIAGNOSIS — F17200 Nicotine dependence, unspecified, uncomplicated: Secondary | ICD-10-CM | POA: Insufficient documentation

## 2013-02-28 DIAGNOSIS — Z8711 Personal history of peptic ulcer disease: Secondary | ICD-10-CM

## 2013-02-28 DIAGNOSIS — R509 Fever, unspecified: Secondary | ICD-10-CM | POA: Insufficient documentation

## 2013-02-28 DIAGNOSIS — F1111 Opioid abuse, in remission: Secondary | ICD-10-CM | POA: Insufficient documentation

## 2013-02-28 DIAGNOSIS — M545 Other chronic pain: Secondary | ICD-10-CM | POA: Insufficient documentation

## 2013-02-28 DIAGNOSIS — D72829 Elevated white blood cell count, unspecified: Secondary | ICD-10-CM

## 2013-02-28 DIAGNOSIS — N10 Acute tubulo-interstitial nephritis: Principal | ICD-10-CM | POA: Insufficient documentation

## 2013-02-28 DIAGNOSIS — K219 Gastro-esophageal reflux disease without esophagitis: Secondary | ICD-10-CM | POA: Insufficient documentation

## 2013-02-28 DIAGNOSIS — R109 Unspecified abdominal pain: Secondary | ICD-10-CM

## 2013-02-28 DIAGNOSIS — R112 Nausea with vomiting, unspecified: Secondary | ICD-10-CM | POA: Insufficient documentation

## 2013-02-28 DIAGNOSIS — Z79899 Other long term (current) drug therapy: Secondary | ICD-10-CM | POA: Insufficient documentation

## 2013-02-28 DIAGNOSIS — Z8719 Personal history of other diseases of the digestive system: Secondary | ICD-10-CM | POA: Insufficient documentation

## 2013-02-28 DIAGNOSIS — N12 Tubulo-interstitial nephritis, not specified as acute or chronic: Secondary | ICD-10-CM | POA: Diagnosis present

## 2013-02-28 DIAGNOSIS — H9209 Otalgia, unspecified ear: Secondary | ICD-10-CM | POA: Insufficient documentation

## 2013-02-28 HISTORY — DX: Personal history of peptic ulcer disease: Z87.11

## 2013-02-28 LAB — URINALYSIS, ROUTINE W REFLEX MICROSCOPIC
Bilirubin Urine: NEGATIVE
Ketones, ur: NEGATIVE mg/dL
Nitrite: POSITIVE — AB
Specific Gravity, Urine: 1.021 (ref 1.005–1.030)
Urobilinogen, UA: 0.2 mg/dL (ref 0.0–1.0)
pH: 7 (ref 5.0–8.0)

## 2013-02-28 LAB — BASIC METABOLIC PANEL
BUN: 8 mg/dL (ref 6–23)
Calcium: 8.9 mg/dL (ref 8.4–10.5)
Creatinine, Ser: 0.55 mg/dL (ref 0.50–1.10)
GFR calc Af Amer: >90 mL/min (ref >90)
GFR calc non Af Amer: >90 mL/min (ref >90)
Glucose, Bld: 151 mg/dL — ABNORMAL HIGH (ref 70–99)
Potassium: 3.8 mEq/L (ref 3.5–5.1)

## 2013-02-28 LAB — HEPATIC FUNCTION PANEL
AST: 15 U/L (ref 0–37)
Bilirubin, Direct: <0.1 mg/dL (ref 0.0–0.3)
Total Bilirubin: 0.1 mg/dL — ABNORMAL LOW (ref 0.3–1.2)

## 2013-02-28 LAB — CBC WITH DIFFERENTIAL/PLATELET
Basophils Absolute: 0 10*3/uL (ref 0.0–0.1)
Basophils Relative: 0 % (ref 0–1)
Eosinophils Absolute: 0.1 10*3/uL (ref 0.0–0.7)
MCH: 31.5 pg (ref 26.0–34.0)
MCHC: 35 g/dL (ref 30.0–36.0)
Monocytes Relative: 7 % (ref 3–12)
Neutro Abs: 17.4 10*3/uL — ABNORMAL HIGH (ref 1.7–7.7)
Neutrophils Relative %: 81 % — ABNORMAL HIGH (ref 43–77)
Platelets: 217 10*3/uL (ref 150–400)
RDW: 13.6 % (ref 11.5–15.5)

## 2013-02-28 LAB — URINE MICROSCOPIC-ADD ON

## 2013-02-28 MED ORDER — ZOLPIDEM TARTRATE 5 MG PO TABS
5.0000 mg | ORAL_TABLET | Freq: Once | ORAL | Status: AC | PRN
  Administered 2013-02-28: 5 mg via ORAL
  Filled 2013-02-28: qty 1

## 2013-02-28 MED ORDER — SODIUM CHLORIDE 0.9 % IV BOLUS (SEPSIS)
1000.0000 mL | Freq: Once | INTRAVENOUS | Status: AC
  Administered 2013-02-28: 1000 mL via INTRAVENOUS

## 2013-02-28 MED ORDER — PANTOPRAZOLE SODIUM 40 MG PO TBEC
40.0000 mg | DELAYED_RELEASE_TABLET | Freq: Every day | ORAL | Status: DC
  Administered 2013-02-28 – 2013-03-01 (×2): 40 mg via ORAL
  Filled 2013-02-28 (×2): qty 1

## 2013-02-28 MED ORDER — PROMETHAZINE HCL 25 MG/ML IJ SOLN
25.0000 mg | Freq: Once | INTRAMUSCULAR | Status: AC
  Administered 2013-02-28: 25 mg via INTRAVENOUS
  Filled 2013-02-28: qty 1

## 2013-02-28 MED ORDER — TRAMADOL HCL 50 MG PO TABS
50.0000 mg | ORAL_TABLET | Freq: Four times a day (QID) | ORAL | Status: DC | PRN
  Administered 2013-02-28 (×2): 50 mg via ORAL
  Filled 2013-02-28 (×2): qty 1

## 2013-02-28 MED ORDER — DEXTROSE 5 % IV SOLN
1.0000 g | Freq: Once | INTRAVENOUS | Status: AC
  Administered 2013-02-28: 1 g via INTRAVENOUS
  Filled 2013-02-28: qty 10

## 2013-02-28 MED ORDER — SODIUM CHLORIDE 0.9 % IV SOLN
INTRAVENOUS | Status: DC
  Administered 2013-02-28: 12:00:00 via INTRAVENOUS
  Administered 2013-03-01: 75 mL/h via INTRAVENOUS

## 2013-02-28 MED ORDER — ACETAMINOPHEN 650 MG RE SUPP: 650.0000 mg | Freq: Four times a day (QID) | RECTAL | Status: DC | PRN

## 2013-02-28 MED ORDER — TRAMADOL HCL 50 MG PO TABS
100.0000 mg | ORAL_TABLET | Freq: Four times a day (QID) | ORAL | Status: DC | PRN
  Administered 2013-02-28: 50 mg via ORAL
  Administered 2013-03-01: 100 mg via ORAL
  Administered 2013-03-01: 50 mg via ORAL
  Administered 2013-03-01: 100 mg via ORAL
  Filled 2013-02-28: qty 1
  Filled 2013-02-28 (×3): qty 2

## 2013-02-28 MED ORDER — ACETAMINOPHEN 325 MG PO TABS
650.0000 mg | ORAL_TABLET | Freq: Four times a day (QID) | ORAL | Status: DC | PRN
  Administered 2013-02-28: 650 mg via ORAL
  Filled 2013-02-28: qty 2

## 2013-02-28 MED ORDER — KETOROLAC TROMETHAMINE 30 MG/ML IJ SOLN
30.0000 mg | Freq: Once | INTRAMUSCULAR | Status: AC
  Administered 2013-02-28: 30 mg via INTRAVENOUS
  Filled 2013-02-28: qty 1

## 2013-02-28 MED ORDER — CYCLOBENZAPRINE HCL 10 MG PO TABS
5.0000 mg | ORAL_TABLET | Freq: Three times a day (TID) | ORAL | Status: DC | PRN
  Administered 2013-02-28: 20:00:00 via ORAL
  Administered 2013-02-28 – 2013-03-01 (×2): 5 mg via ORAL
  Filled 2013-02-28 (×3): qty 1

## 2013-02-28 MED ORDER — NICOTINE 21 MG/24HR TD PT24
21.0000 mg | MEDICATED_PATCH | Freq: Every day | TRANSDERMAL | Status: DC
  Administered 2013-02-28: 21 mg via TRANSDERMAL
  Filled 2013-02-28 (×2): qty 1

## 2013-02-28 MED ORDER — PROMETHAZINE HCL 12.5 MG PO TABS
12.5000 mg | ORAL_TABLET | Freq: Four times a day (QID) | ORAL | Status: DC | PRN
  Administered 2013-02-28 – 2013-03-01 (×5): 12.5 mg via ORAL
  Filled 2013-02-28 (×5): qty 1

## 2013-02-28 MED ORDER — CIPROFLOXACIN HCL 500 MG PO TABS
500.0000 mg | ORAL_TABLET | Freq: Two times a day (BID) | ORAL | Status: DC
  Administered 2013-02-28 – 2013-03-01 (×3): 500 mg via ORAL
  Filled 2013-02-28 (×5): qty 1

## 2013-02-28 MED ORDER — CIPROFLOXACIN-DEXAMETHASONE 0.3-0.1 % OT SUSP
4.0000 [drp] | Freq: Two times a day (BID) | OTIC | Status: DC
  Administered 2013-02-28 (×2): 4 [drp] via OTIC
  Filled 2013-02-28: qty 7.5

## 2013-02-28 MED ORDER — ENOXAPARIN SODIUM 40 MG/0.4ML ~~LOC~~ SOLN
40.0000 mg | SUBCUTANEOUS | Status: DC
  Filled 2013-02-28 (×2): qty 0.4

## 2013-02-28 MED ORDER — IBUPROFEN 600 MG PO TABS
600.0000 mg | ORAL_TABLET | Freq: Four times a day (QID) | ORAL | Status: DC | PRN
  Administered 2013-02-28: 600 mg via ORAL
  Filled 2013-02-28: qty 1

## 2013-02-28 NOTE — ED Provider Notes (Signed)
Medical screening examination/treatment/procedure(s) were conducted as a shared visit with non-physician practitioner(s) and myself.  I personally evaluated the patient during the encounter   Loren Racer, MD 02/28/13 1106

## 2013-02-28 NOTE — ED Notes (Signed)
sts fever, kdiney problems and back pain.

## 2013-02-28 NOTE — H&P (Signed)
Date: 02/28/2013               Patient Name:  Tammy Winters MRN: [id number]  DOB: 04/20/1986 Age / Sex: 27 y.o., female   PCP: No Pcp Per Patient         Medical Service: Internal Medicine Teaching Service         Attending Physician: Dr. Rocco Serene, MD    First Contact: Dr. Aundria Rud Pager: [phone]  Second Contact: Dr. Manson Passey  Pager: [phone]       After Hours (After 5p/  First Contact Pager: [phone]  weekends / holidays): Second Contact Pager: [phone]   Chief Complaint: fever, nausea/vomiting  History of Present Illness:  [name] is a 27 y/o woman with history of opioid dependence and heroin abuse (hospitalized earlier this month for methadone withdrawal) who presents with fever, nausea/vomiting since yesterday.  Pt states she has also had "pain in her kidneys" x 2 weeks.  She describes the pain as 8/10, sharp, constant, worse on the right side, worse with movement.  Pt states she had a fever to 103F yesterday with chills. She has taken acetominophen, children's Nyquil, and two percocets for pain, with little relief.     Pt has had urinary frequency x 1 week and endorses dysuria and urgency.  She also states that this morning, she noticed a small amount of blood on the toilet paper after wiping. Pt states she has had nausea and vomiting x 2-3 since yesterday and had one loose stool, non-bloody, this morning. She has had decreased PO intake and decreased appetite as a result.   Pt is also complaining of right ear pain without drainage.  She states that the ear is tender to touch.  She has not been swimming in either pools or lakes recently.   Of note, pt has not taken methadone since prior to her last hospitalization and does endorse any illicit drugs other than THC.  Pt also has never taken levothyroxine in the outpatient setting.    Meds: Current Facility-Administered Medications  Medication Dose Route Frequency Provider Last Rate Last Dose  . 0.9 %  sodium chloride  infusion   Intravenous Continuous Rocco Serene, MD 75 mL/hr at 02/28/13 1216    . acetaminophen (TYLENOL) tablet 650 mg  650 mg Oral Q6H PRN Rocco Serene, MD       Or  . acetaminophen (TYLENOL) suppository 650 mg  650 mg Rectal Q6H PRN Rocco Serene, MD      . ciprofloxacin (CIPRO) tablet 500 mg  500 mg Oral BID Rocco Serene, MD   500 mg at 02/28/13 1305  . ciprofloxacin-dexamethasone (CIPRODEX) 0.3-0.1 % otic suspension 4 drop  4 drop Right Ear BID Rocco Serene, MD   4 drop at 02/28/13 1306  . cyclobenzaprine (FLEXERIL) tablet 5 mg  5 mg Oral TID PRN Rocco Serene, MD   5 mg at 02/28/13 1305  . enoxaparin (LOVENOX) injection 40 mg  40 mg Subcutaneous Q24H Rocco Serene, MD      . promethazine (PHENERGAN) tablet 12.5 mg  12.5 mg Oral Q6H PRN Rocco Serene, MD   12.5 mg at 02/28/13 1216  . traMADol (ULTRAM) tablet 50 mg  50 mg Oral Q6H PRN Rocco Serene, MD   50 mg at 02/28/13 1111    Allergies: Allergies as of 02/28/2013 - Review Complete 02/28/2013  Allergen Reaction Noted  . Adhesive (tape) Other (See Comments) 11/17/2012  . Reglan (metoclopramide) Other (See Comments) 11/16/2012  . Zofran (  ondansetron hcl) Other (See Comments) 11/16/2012   Past Medical History  Diagnosis Date  . Drug addict   . Heroin abuse   . Anxiety   . GERD (gastroesophageal reflux disease)   . History of stomach ulcers   . 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 Chronic lower back pain    Past Surgical History  Procedure Laterality Date  . Cholecystectomy    . Dilation and curettage of uterus      "I've had 3" (01/26/2013)  . Multiple tooth extractions      "I've had all my teeth removed" (01/26/2013)   Family History  Problem Relation Age of Onset  . Diabetes Mother   . Cancer Mother   . Diabetes Father   . Cancer Father    History   Social History  . Marital Status: Single    Spouse Name: N/A    Number of Children: N/A  . Years of Education: N/A   Occupational History  .  Not on file.   Social History Main Topics  . Smoking status: Current Some Day Smoker -- 1.50 packs/day for 12 years    Types: Cigarettes  . Smokeless tobacco: Never Used  . Alcohol Use: Yes     Comment: one beer per week  . Drug Use: Yes    Special: Marijuana, Heroin     Comment: prior to jail - heroin; "started taking methadone 5 yr ago; no drugs since" (01/26/2013); pt uses THC "from time to time" (02/28/13)  . Sexually Active: Yes -- Female partner(s)   Other Topics Concern  . Not on file   Social History Narrative   Pt was previously homeless.  Now living at her boyfriend's sister's home trying to save money.     Review of Systems:  ROS General: per HPI Skin: no rash HEENT: + sore throat, no blurry vision, hearing changes Pulm: no dyspnea, coughing, wheezing CV: no chest pain, palpitations, shortness of breath Abd: per HPI GU: per HPI Ext: no arthralgias, myalgias Neuro: no weakness, numbness, or tingling  Physical Exam:  Blood pressure 110/66, pulse 68, temperature 98.4 F (36.9 C), temperature source Oral, resp. rate 16, SpO2 98.00%. General: alert, cooperative, but in moderate distress; skin on face flushed HEENT: vision grossly intact, oropharynx clear and non-erythematous; right ear very tender to palpation and with mild erythema of external auditory canal, no drainage Neck: supple, no lymphadenopathy, JVD, or carotid bruits Lungs: clear to ascultation bilaterally, normal work of respiration, no wheezes, rales, ronchi Heart: regular rate and rhythm, no murmurs, gallops, or rubs Abdomen:  ?CVA tenderness, possibly paraspinal muscle tenderness; mild TTP in RUQ, soft, non-distended, normal bowel sounds Extremities: 2+ DP/PT pulses bilaterally, no cyanosis, clubbing, or edema Neurologic: alert & oriented X3, cranial nerves II-XII intact, strength grossly intact, sensation intact to light touch  Lab results: Basic Metabolic Panel:  Recent Labs  16/10/96 0759  NA 137    K 3.8  CL 105  CO2 23  GLUCOSE 151*  BUN 8  CREATININE 0.55  CALCIUM 8.9   CBC:  Recent Labs  02/28/13 0759  WBC 21.5*  NEUTROABS 17.4*  HGB 13.5  HCT 38.6  MCV 90.0  PLT 217    Urinalysis:  Recent Labs  02/28/13 0755  COLORURINE YELLOW  LABSPEC 1.021  PHURINE 7.0  GLUCOSEU NEGATIVE  HGBUR MODERATE*  BILIRUBINUR NEGATIVE  KETONESUR NEGATIVE  PROTEINUR NEGATIVE  UROBILINOGEN 0.2  NITRITE POSITIVE*  LEUKOCYTESUR SMALL*   Imaging  results:  No results found.  Other results: EKG: none  Assessment & Plan by Problem: Ms. Huether is a 27 y/o woman with history of opioid dependence and heroin abuse who was admitted for acute pyelonephritis.   #Pyelonephritis- Pt complaining of back pain x 2 weeks with nausea/vomiting x 2-3 episodes since yesterday, urinary sx including frequency, urgency, and mild hematuria.  Pt does have WBC of 21.5 with left shift, implying bacterial infection.  Her urinalysis was remarkable for moderate Hgb, positive nitrite, and small leukesterase with only 7-10 WBCs.  Pt endorses fever at home to 103F yesterday with associated chills although she has been afebrile since presentation.  She did take Tylenol before coming to the ED which could have brought down underlying fever but unlikely after 7 hours as she has not had anymore Tylenol since.  Pt received antibiotics before blood cultures could be drawn so given that pt has been afebrile ince presentation, we decided not to pursue.  Will treat with antibiotics and IVF given history of vomiting and fever.  Will check urine drug screen given pt's history of drug use.  -start ciprofloxacin 500mg  PO BID -NS at 75 cc/hr -orthostatic vitals -UDS -follow-up urine culture -trend CBC (WBC count) -tylenol, tramadol prn for pain  -phenergan prn for nausea/vomiting  #Otitis externa- Pt complaining of right ear pain, tender to touch; pt has not been swimming lately. On exam, pt's ear is very tender and has  some erythema of external auditory canal.  Will treat for otitis externa with antibiotic plus steroid drops for symptomatic relief.  -start cipro-dexamethasone otic suspension  #?DM2- Pt does not appear to have diabetes.  Normal BGs with no insulin.   #?Hypothyroid- Pt does not appear to have hypothyroidism.  She has only ever taken levothyroxine when she is an inpatient, had normal TSH on her admission labs at last admission.  -discontinue levothyroxine   #DVT PPX- lovenox   Dispo: Disposition is deferred at this time, awaiting improvement of current medical problems. Anticipated discharge in approximately 1-2 day(s).   The patient does not have a current PCP (No Pcp Per Patient) and does need an [facility] hospital follow-up appointment after discharge.   Signed: Rocco Serene, MD 02/28/2013, 2:19 PM

## 2013-02-28 NOTE — ED Provider Notes (Signed)
CSN: [id number]     Arrival date & time 02/28/13  0654 History     First MD Initiated Contact with Patient 02/28/13 [phone]     Chief Complaint  Patient presents with  . Fever   (Consider location/radiation/quality/duration/timing/severity/associated sxs/prior Treatment) HPI  Tammy Winters is a 27 y.o.female with a significant PMH of hx of heroine addiction, anxiety, hypothyroidism, pneumonia, GERD, seizure/pseudoseizure dx, chronic pain, methadone withdrawals presents to the ER with complaints of fever, nausea, vomiting, diarrhea and back pains (worse on the right). She was admitted 1 month ago for nausea, vomiting, bacterial vaginosis and methadone withdrawal. Since leaving the hospital she has not been taking her Keppra or any other prescribed medication. She endorses no longer having any lower abdominal pain, vaginal pain/discharge or low back pain. She is having bilateral flank pain worse on right, hematuria and diffuse abdominal pains. She also complains of nasal congestion and bilateral ear pain. She has not taken her methadone since being discharged from the hospital because she was kicked out of her Methadone clinic and she also says she can not afford the medication anymore.     Past Medical History  Diagnosis Date  . Zoster   . Drug addict   . Heroin abuse   . Anxiety   . Hypothyroidism 01/26/2013  . Chest pain at rest   . Pneumonia     "twice" (01/26/2013)  . Chronic bronchitis     "get it q yr" (01/26/2013)  . Shortness of breath     "always; a little bit" (01/26/2013)  . Type I diabetes mellitus   . GERD (gastroesophageal reflux disease)   . History of stomach ulcers   . Seizures     "today was my second" (01/26/2013)  . Arthritis     "lower spine" (01/26/2013)  . Chronic lower back pain    Past Surgical History  Procedure Laterality Date  . Cholecystectomy    . Dilation and curettage of uterus      "I've had 3" (01/26/2013)  . Multiple tooth extractions      "I've  had all my teeth removed" (01/26/2013)   Family History  Problem Relation Age of Onset  . Diabetes Mother   . Cancer Mother   . Diabetes Father   . Cancer Father    History  Substance Use Topics  . Smoking status: Current Some Day Smoker -- 1.00 packs/day for 12 years    Types: Cigarettes  . Smokeless tobacco: Never Used  . Alcohol Use: No     Comment: 01/26/2013 "last drink was years ago"   OB History   Grav Para Term Preterm Abortions TAB SAB Ect Mult Living            2     Review of Systems  Constitutional: Positive for fever, chills and fatigue.  Gastrointestinal: Positive for nausea, vomiting and diarrhea.  Genitourinary: Positive for hematuria and flank pain.  All other systems reviewed and are negative.    Allergies  Adhesive; Reglan; and Zofran  Home Medications   Current Outpatient Rx  Name  Route  Sig  Dispense  Refill  . acetaminophen (TYLENOL) 325 MG tablet   Oral   Take 650 mg by mouth 3 (three) times daily as needed for pain.         . Pseudoeph-Chlorphen-DM (CHILDRENS NYQUIL PO)   Oral   Take 30 mLs by mouth every 6 (six) hours as needed (fever and cold symptoms).  BP 111/70  Pulse 104  Temp(Src) 98.3 F (36.8 C) (Oral)  Resp 18  SpO2 97% Physical Exam  Nursing note and vitals reviewed. Constitutional: She appears well-developed and well-nourished. No distress.  HENT:  Head: Normocephalic and atraumatic.  Right Ear: Tympanic membrane is injected.  Left Ear: Tympanic membrane normal.  Mouth/Throat: Uvula is midline and oropharynx is clear and moist.  Eyes: Pupils are equal, round, and reactive to light.  Neck: Normal range of motion. Neck supple.  Cardiovascular: Normal rate and regular rhythm.   Pulmonary/Chest: Effort normal.  Abdominal: Soft. She exhibits no distension. There is tenderness (diffuse mild). There is CVA tenderness (worse on the right).  Neurological: She is alert.  Skin: Skin is warm and dry.    ED Course    Procedures (including critical care time)  Labs Reviewed  URINALYSIS, ROUTINE W REFLEX MICROSCOPIC - Abnormal; Notable for the following:    APPearance CLOUDY (*)    Hgb urine dipstick MODERATE (*)    Nitrite POSITIVE (*)    Leukocytes, UA SMALL (*)    All other components within normal limits  BASIC METABOLIC PANEL - Abnormal; Notable for the following:    Glucose, Bld 151 (*)    All other components within normal limits  CBC WITH DIFFERENTIAL - Abnormal; Notable for the following:    WBC 21.5 (*)    Neutrophils Relative % 81 (*)    Neutro Abs 17.4 (*)    Lymphocytes Relative 11 (*)    Monocytes Absolute 1.5 (*)    All other components within normal limits  URINE MICROSCOPIC-ADD ON - Abnormal; Notable for the following:    Squamous Epithelial / LPF MANY (*)    Bacteria, UA MANY (*)    All other components within normal limits  URINE CULTURE  PREGNANCY, URINE   No results found. 1. Leukocytosis   2. Nausea and vomiting   3. Flank pain     MDM  Patient clinically has pyelo. Urine does not show many WBC's but she is nitrate positive and has MANY bacteria.   Since it has been greater than two weeks and this is a new problem she is not considered a bounce back. She has no PCP and I will admit her to unassigned medicine.  IV rocephin, fluids, phenergan and Toradol initiated.  Pt admitted to internal medicine, inpatient, medsurg  Dorthula Matas, PA-C 02/28/13 4782  Dorthula Matas, PA-C 02/28/13 [phone]

## 2013-02-28 NOTE — H&P (Signed)
Date: 02/28/2013               Patient Name:  Tammy Winters MRN: [id number]  DOB: Jul 14, 1986 Age / Sex: 27 y.o., female   PCP: No Pcp Per Patient              Medical Service: Internal Medicine Teaching Service              Attending Physician: Dr. Josem Kaufmann    First Contact: Dr. Aundria Rud Pager: [phone]  Second Contact: Dr. Manson Passey  Pager: [phone]            After Hours (After 5p/  First Contact Pager: [phone]  weekends / holidays): Second Contact Pager: [phone]   Chief Complaint:   Fever, nausea and vomiting  History of Present Illness:   Tammy Winters is a 27 year old caucasian female with a PMH of opiod addiction with a recent admission for methadone withdrawal who presents with flank pain, fever, nausea, vomiting and diarrhea. She reports the kidney pain started 2 weeks ago and has progressively worsened, currently experiencing 8-9/10 pain. She describes the pain as a constant, burning pain that worsens with movement and palpation and is not relieved by anything. To note, she reports taking a few of her sisters percocet without relief.  She reports fever started two days ago with accompanying chills and diaphoresis. Recorded home temperature was 103, which she treated with tylenol and children's Nyquil. She is currently afebrile. Nausea started yesterday with three reported episodes of vomiting and one episode of non bloody diarrhea. She reports accompanying decreased appetite and PO intake. Additionally, Tammy Winters reports increased frequency, mild hesitancy, pain and mild hematuria during urination for the past week. She endorses low back pain following urination, but denies any supra pubic pain, vaginal discharge or itching. Tammy Winters endorses experiencing similar symptoms in the past, most recently during her admission 02/06/13 for nausea, vomiting, bacterial vaginosis and seizure secondary to methadone withdrawal. Tammy Winters reports feeling well on discharge and for about a week following at  which point new symptoms arose. She has not taken any medications since discharge due to financial constraints, reporting the only medication she receives is in the hospital.  Additionally she reports having sinus pain and congestion, with accompanying sore throat and non productive cough. She is particularly complaining of right ear pain. She denies any discharge or water activity.   In the ED CBC revealed elevated WBC of 21.5 with 17.4 absolute neutrophils (81%). UA revealed cloudy urine, positive for nitrites and small leukocuytes, 7-10 WBC and many bacteria and squamous cells. Urine culture pending. Pregnancy test was negative. Patient was given a 1L bolus of fluid and 1g IV ceftriaxone in ED. To note, the patient has not been on methadone since the withdrawal.    Meds: Current Facility-Administered Medications  Medication Dose Route Frequency Provider Last Rate Last Dose  . sodium chloride 0.9 % bolus 1,000 mL  1,000 mL Intravenous Once Dorthula Matas, PA-C 1,000 mL/hr at 02/28/13 1023 1,000 mL at 02/28/13 1023   Current Outpatient Prescriptions  Medication Sig Dispense Refill  . acetaminophen (TYLENOL) 325 MG tablet Take 650 mg by mouth 3 (three) times daily as needed for pain.      . Pseudoeph-Chlorphen-DM (CHILDRENS NYQUIL PO) Take 30 mLs by mouth every 6 (six) hours as needed (fever and cold symptoms).        Allergies: Allergies as of 02/28/2013 - Review Complete 02/28/2013  Allergen Reaction Noted  . Adhesive (  tape) Other (See Comments) 11/17/2012  . Reglan (metoclopramide) Other (See Comments) 11/16/2012  . Zofran (ondansetron hcl) Other (See Comments) 11/16/2012   Past Medical History  Diagnosis Date  . Drug addict   . Heroin abuse   . Anxiety   . GERD (gastroesophageal reflux disease)   . History of stomach ulcers   . Seizures     "today was my second" (01/26/2013)  . Chronic lower back pain    Past Surgical History  Procedure Laterality Date  . Cholecystectomy    .  Dilation and curettage of uterus      "I've had 3" (01/26/2013)  . Multiple tooth extractions      "I've had all my teeth removed" (01/26/2013)   Family History  Problem Relation Age of Onset  . Diabetes Mother   . Cancer Mother   . Diabetes Father   . Cancer Father    History   Social History  . Marital Status: Single    Spouse Name: N/A    Number of Children: N/A  . Years of Education: N/A   Occupational History  . Not on file.   Social History Main Topics  . Smoking status: Current Some Day Smoker -- 1.00 packs/day for 12 years    Types: Cigarettes  . Smokeless tobacco: Never Used  . Alcohol Use: No     Comment: 01/26/2013 "last drink was years ago"  . Drug Use: Yes    Special: Marijuana, Heroin     Comment: prior to jail - heroin; "started taking methadone 5 yr ago; no drugs since" (01/26/2013)  . Sexually Active: Yes   Other Topics Concern  . Not on file   Social History Narrative  . No narrative on file    Review of Systems: As per HPI, in addition to: General: fever, chills, diaphoresis X2 days, no weight change or fatigue Skin: no rashes, no skin, hair or nail changes Head: no headaches Eyes: no acute changes in vision, no blurriness, no pain or itching Ears: Right ear pain, no discharge, no hearing loss, tinnitus or vertigo  Nose, Sinuses: Sinus pain and congestion x2days, no itching, allergy or epistaxis Mouth, throat, neck: sore throat x2days, no hoarseness  Cardiac: no chest pain, palpitations, or dyspnea  Respiratory: no shortness of breath, no wheezing, non productive cough x2days GI: nausea/ vomiting/ non bloody diarrhea, flank pain and tenderness (more significant on right side), no abdominal pain Urinary: frequency, hesitancy, and pain on urination x1w, subjective hematuria Endocrine: no heat or cold intolerance  Physical Exam: Blood pressure 111/70, pulse 104, temperature 98.3 F (36.8 C), temperature source Oral, resp. rate 18, SpO2  97.00%.  General:  Well appearing female, alert and engaged, and in mild distress HEENT: right otitis externa injected and inflamed, clear tympanic membranes, pupils round and reactive to light, vision grossly intact, moist mucous membranes, oropharynx clear  Heart: regular rate and rhythm, no murmurs, gallops or rubs appreciated Lungs: normal respiratory effort, lungs clear to ascultation bilaterally, no wheezes, rales or ronchi  Abdomen: normal active bowel sounds, soft, diffuse tenderness to palpation in RUQ and right flank, mild CVA tenderness, greater on the right, some perispinal lower back pain on palpation, no rebound or gaurding Neuro: alert and oriented x3, cranial nerves II-XII grossly intact, strength and sensation grossly intact bilaterally  Lab results: @LABTEST @  Imaging results:  No results found.  Other results: EKG: normal EKG, normal sinus rhythm, unchanged from previous tracings.  Assessment & Plan by Problem:  Ms. Colunga is a 27 year old caucasian female with a PMH of opiod addiction, anxiety, GERD and peptic ulcer disease who presents with flank pain, fever, nausea, vomiting and diarrhea most consistent with pyelonephritis.   # Acute Pyelonephritis   Patient presented with bilateral flank pain, fever, nausea, vomiting, diarrhea and subjective hematuria. Symptoms of cystitis, urinary frequency, hesitancy, and lower abdominal pain are also present. CBC revealed elevated WBC of 21.5 with 17.4 absolute neutrophils (81%). UA revealed cloudy urine, positive for nitrites and small leukocuytes. Urine culture is still pending. To note, urine culture from admission on 02/06/13 grew enterococcus species but was untreated. Constellation of signs and symptoms are most consistent with pyelonephritis. Other diagnoses on the differential include chronic pyelonephritis, PID, and pregnancy with a associated complications. Chronic pyelonephritis is less likely in this patient as there is no  history of recurrent UTIs, or renal abnormalities. Pelvic inflammatory disease can present with similar symptoms of abdominal pain and tenderness, nausea, vomiting and systemic symptoms. However PID is less likely as the patient denies suprapubic pain, vaginal discharge and was negative for gonorrhea and chlamydia on prior admission 02/06/13.Pregnancy and associated complications can be ruled out based on negative pregnancy test.  - IV fluids- NS 27ml/hr to maintain hydration in context of decreased PO intake and N/V/D - Oral Ciprofloxacin 500mg  BID for 7 days -Tylenol 650mg  oral PRN for rever/ pain - Flexeril 5mg  oral PRN for pain - Toradol 30mg  IV PRN for apin - Phenergan 25mg  IV/ 12.5mg  oral for nausea - Repeat CBC am -UDS pending  -Urine cultures pending   # Right Otitis Externa  Patient complains of significant right ear pain. Patient reports a feeling of fullness or pressure in the ear without discharge or history of exposure to water activities. Physical exam reveals right canal injected and inflamed consistent with otitis externa. - ciprofloxacin-dexamethosone 0.3-0.1% otic suspension drops   #Diabetes Mellitus Type I  DM I was recorded in past medical history. However, given the patient is not currently on any medication, is maintaining normal blood sugar levels and has an A1C of 5.0% as of 02/06/13, she does not have DM type I at this time.   # Hypothyroidism   Hypothyroid was recorded in past medical history. However, given the patient is not currently taking synthroid, has a TSH of 2.253 as of 02/08/13 , and is asymptomatic, she does not have hypothyroidism at this time.   #Seizure disorder   Witnessed seizures were recorded in past medical history following methadone withdrawal. Given the patient is not currently taking the prescribed keppra and seizures were documented in the context of withdrawal, medication is not required at this time.    This is a Psychologist, occupational Note.  The care  of the patient was discussed with Dr. Aundria Rud and the assessment and plan was formulated with their assistance.  Please see their note for official documentation of the patient encounter.   Signed: Everardo All, Med Student 02/28/2013, 10:41 AM

## 2013-03-01 DIAGNOSIS — N12 Tubulo-interstitial nephritis, not specified as acute or chronic: Secondary | ICD-10-CM

## 2013-03-01 LAB — COMPREHENSIVE METABOLIC PANEL
ALT: 15 U/L (ref 0–35)
Alkaline Phosphatase: 68 U/L (ref 39–117)
CO2: 20 mEq/L (ref 19–32)
GFR calc Af Amer: >90 mL/min (ref >90)
Glucose, Bld: 80 mg/dL (ref 70–99)
Potassium: 4.5 mEq/L (ref 3.5–5.1)
Sodium: 136 mEq/L (ref 135–145)
Total Protein: 6 g/dL (ref 6.0–8.3)

## 2013-03-01 LAB — CBC
Hemoglobin: 12.2 g/dL (ref 12.0–15.0)
MCHC: 32.8 g/dL (ref 30.0–36.0)
RBC: 4.09 MIL/uL (ref 3.87–5.11)
WBC: 11.5 10*3/uL — ABNORMAL HIGH (ref 4.0–10.5)

## 2013-03-01 LAB — RAPID URINE DRUG SCREEN, HOSP PERFORMED
Barbiturates: NOT DETECTED
Tetrahydrocannabinol: POSITIVE — AB

## 2013-03-01 MED ORDER — TRAMADOL HCL 50 MG PO TABS: 100.0000 mg | ORAL_TABLET | Freq: Four times a day (QID) | ORAL | Status: DC | PRN

## 2013-03-01 MED ORDER — PROMETHAZINE HCL 12.5 MG PO TABS: 12.5000 mg | ORAL_TABLET | Freq: Three times a day (TID) | ORAL | Status: DC | PRN

## 2013-03-01 MED ORDER — CIPROFLOXACIN HCL 500 MG PO TABS: 500.0000 mg | ORAL_TABLET | Freq: Two times a day (BID) | ORAL | Status: DC

## 2013-03-01 MED ORDER — CYCLOBENZAPRINE HCL 5 MG PO TABS: 5.0000 mg | ORAL_TABLET | Freq: Three times a day (TID) | ORAL | Status: DC | PRN

## 2013-03-01 NOTE — Progress Notes (Signed)
Subjective: [name] is doing much better this morning.  She was sitting on rounds and talkative, stating that her pain is tolerable now, and she would like to go outside to smoke.  No nausea/vomiting, no dysuria or hematuria.   Objective: Vital signs in last 24 hours: Filed Vitals:   02/28/13 1113 02/28/13 1349 02/28/13 2023 03/01/13 0700  BP: 98/64 110/66 119/69 124/75  Pulse: 81 68 69 56  Temp: 97.7 F (36.5 C) 98.4 F (36.9 C) 98.3 F (36.8 C) 97.8 F (36.6 C)  TempSrc: Oral     Resp: 17 16 16 16   SpO2: 98% 98% 100% 100%   Weight change:   Intake/Output Summary (Last 24 hours) at 03/01/13 0915 Last data filed at 03/01/13 0700  Gross per 24 hour  Intake   1080 ml  Output      2 ml  Net   1078 ml   PEX General: alert, cooperative, and in no apparent distress HEENT: vision grossly intact, oropharynx clear and non-erythematous  Neck: supple, no lymphadenopathy, JVD, or carotid bruits Lungs: clear to ascultation bilaterally, normal work of respiration, no wheezes, rales, ronchi Heart: regular rate and rhythm, no murmurs, gallops, or rubs Abdomen: mild CVA tenderness, no paraspinal muscle tenderness; soft, non-tender, non-distended, normal bowel sounds Extremities: no cyanosis, clubbing, or edema Neurologic: alert & oriented X3, cranial nerves II-XII intact, strength grossly intact, sensation intact to light touch  Lab Results: Basic Metabolic Panel:  Recent Labs Lab 02/28/13 0759 03/01/13 0625  NA 137 136  K 3.8 4.5  CL 105 106  CO2 23 20  GLUCOSE 151* 80  BUN 8 6  CREATININE 0.55 0.45*  CALCIUM 8.9 8.8   Liver Function Tests:  Recent Labs Lab 02/28/13 1950 03/01/13 0625  AST 15 19  ALT 16 15  ALKPHOS 71 68  BILITOT 0.1* 0.2*  PROT 6.1 6.0  ALBUMIN 2.8* 2.6*    Recent Labs Lab 02/28/13 1950  LIPASE 54   CBC:  Recent Labs Lab 02/28/13 0759 03/01/13 0625  WBC 21.5* 11.5*  NEUTROABS 17.4*  --   HGB 13.5 12.2  HCT 38.6 37.2  MCV 90.0  91.0  PLT 217 188   Urine Drug Screen: Drugs of Abuse     Component Value Date/Time   LABOPIA NONE DETECTED 02/28/2013 0755   COCAINSCRNUR NONE DETECTED 02/28/2013 0755   LABBENZ POSITIVE* 02/28/2013 0755   AMPHETMU NONE DETECTED 02/28/2013 0755   THCU POSITIVE* 02/28/2013 0755   LABBARB NONE DETECTED 02/28/2013 0755    Urinalysis:  Recent Labs Lab 02/28/13 0755  COLORURINE YELLOW  LABSPEC 1.021  PHURINE 7.0  GLUCOSEU NEGATIVE  HGBUR MODERATE*  BILIRUBINUR NEGATIVE  KETONESUR NEGATIVE  PROTEINUR NEGATIVE  UROBILINOGEN 0.2  NITRITE POSITIVE*  LEUKOCYTESUR SMALL*    Medications: I have reviewed the patient's current medications. Scheduled Meds: . ciprofloxacin  500 mg Oral BID  . ciprofloxacin-dexamethasone  4 drop Right Ear BID  . enoxaparin (LOVENOX) injection  40 mg Subcutaneous Q24H  . nicotine  21 mg Transdermal Daily  . pantoprazole  40 mg Oral Daily   Continuous Infusions: . sodium chloride 75 mL/hr (03/01/13 0034)   PRN Meds:.acetaminophen, acetaminophen, cyclobenzaprine, ibuprofen, promethazine, traMADol Assessment/Plan: Tammy Winters is a 27 y/o woman with history of opioid dependence who was admitted for acute pyelonephritis.  #Pyelonephritis- Pt's is no longer having subjective fevers, no urinary frequency or hematuria, no n/v, CVA tenderness has improved. WBC trending down from 21.5 with left shift yesterday to 11.5 today.  At no point did pt meet SIRS criteria this admission. Of note, pt had an asymptomatic UTI (enterococcus) that was not treated at her last admission earlier this month. UDS positive for THC and benzos only. LFTS within normal limits except for albumin of 2.6, lipase within normal limits.  -continue ciprofloxacin 500mg  PO BID  -d/c NS at 75 cc/hr  -follow-up urine culture  -trend CBC (WBC count)  -tylenol, tramadol prn for pain  -phenergan prn for nausea/vomiting   #?Otitis externa- Pt complaining of right ear pain yesterday, tender to touch;  pt has not been swimming lately. Pt no longer complaining of right ear pain.  Since symptoms have resolved faster than would be expected with true otitis externa, this issue may have represented only mild irritation of exterior ear canal.  -discontinue cipro-dexamethasone otic suspension  #Tobacco abuse- Pt smokes 1.5PPD, encouraged to quit but seems to be precomtemplative at this time.  -nicotine patch   Dispo: Disposition today.   The patient does not have a current PCP (No Pcp Per Patient) and does need an [facility] hospital follow-up appointment after discharge. Will provide pt with list of PCPs to call.   .Services Needed at time of discharge: Y = Yes, Blank = No PT:   OT:   RN:   Equipment:   Other:     LOS: 1 day   Rocco Serene, MD 03/01/2013, 9:15 AM

## 2013-03-01 NOTE — H&P (Signed)
I saw and evaluated the patient. I reviewed the resident's note and confirmed the resident's findings.  I agree with the assessment and plan as documented in the resident's note.  Ms. Ohms was admitted with acute pyelonephritis and responded very well to oral Ciprofloxacin and non-narcotic analgesics.  Urine culture is pending.  I agree with discharge home to complete course of therapy for acute pyelonephritis with oral Cipro.

## 2013-03-01 NOTE — Discharge Summary (Signed)
Name: Tammy Winters MRN: [id number] DOB: 29-Nov-1985 27 y.o. PCP: No Pcp Per Patient  Date of Admission: 02/28/2013  7:40 AM Date of Discharge: 03/01/2013 Attending Physician: Josem Kaufmann  Discharge Diagnosis: Principal Problem:   Pyelonephritis Pt discharged on ciprofloxacin 12 day course (7/27 day 2), tramadol and flexeril for pain, phenergan for nausea.  Discharge Medications:   Medication List    STOP taking these medications       CHILDRENS NYQUIL PO      TAKE these medications       acetaminophen 325 MG tablet  Commonly known as:  TYLENOL  Take 650 mg by mouth 3 (three) times daily as needed for pain.     ciprofloxacin 500 MG tablet  Commonly known as:  CIPRO  Take 1 tablet (500 mg total) by mouth 2 (two) times daily.     cyclobenzaprine 5 MG tablet  Commonly known as:  FLEXERIL  Take 1 tablet (5 mg total) by mouth 3 (three) times daily as needed for muscle spasms.     promethazine 12.5 MG tablet  Commonly known as:  PHENERGAN  Take 1 tablet (12.5 mg total) by mouth every 8 (eight) hours as needed for nausea.     traMADol 50 MG tablet  Commonly known as:  ULTRAM  Take 2 tablets (100 mg total) by mouth every 6 (six) hours as needed.        Disposition and follow-up:   TammyTova S Winters was discharged from [facility] in Stable condition.  At the hospital follow up visit please address:  1. Recovery from pyelonephritis  2.  Labs / imaging needed at time of follow-up: none  3.  Pending labs/ test needing follow-up: none  Follow-up Appointments:   Discharge Instructions: Discharge Orders   Future Orders Complete By Expires     Call MD for:  severe uncontrolled pain  As directed     Diet - low sodium heart healthy  As directed     Increase activity slowly  As directed        Procedures Performed:  Dg Chest 2 View  02/06/2013   *RADIOLOGY REPORT*  Clinical Data: Weakness and emesis  CHEST - 2 VIEW  Comparison:  January 26, 2013  Findings:  Lungs clear.  Heart size and pulmonary vascularity are normal.  No adenopathy.  No bone lesions.  IMPRESSION: No abnormality noted.   Original Report Authenticated By: Bretta Bang, M.D.   Ct Abdomen Pelvis W Contrast  02/06/2013   *RADIOLOGY REPORT*  Clinical Data: Rule out small bowel obstruction  CT ABDOMEN AND PELVIS WITH CONTRAST  Technique:  Multidetector CT imaging of the abdomen and pelvis was performed following the standard protocol during bolus administration of intravenous contrast.  Contrast: OMNIPAQUE IOHEXOL 300 MG/ML  SOLN  Comparison: None  Findings:  Lung bases are clear.  No pleural or pericardial effusion.  No focal liver abnormalities identified.  Prior cholecystectomy. Normal appearance of the pancreas.  The spleen is normal.  Normal appearance of the adrenal glands.  The right kidney is normal.  The left kidney is normal.  Urinary bladder is unremarkable.  The uterus and adnexal structures are unremarkable.  The stomach is normal.  The small bowel loops have a normal course and caliber.  No obstruction.  The appendix is visualized and appears unremarkable.  Normal appearance of the colon.  There is a small amount of free fluid within the right distal pericolic gutter and dependent portion of  the pelvis.  The abdominal aorta has a normal caliber.  There is no upper abdominal adenopathy.  No pelvic or inguinal adenopathy noted.  Review of the visualized osseous structures is unremarkable.  IMPRESSION:  1. Small amount of free fluid within the abdomen and pelvis is nonspecific.  In a premenopausal female a small amount of pelvic fluid may be physiologic.   2. No evidence for small bowel obstruction.   Original Report Authenticated By: Signa Kell, M.D.    Admission HPI:  Tammy Winters is a 27 y/o woman with history of opioid dependence and heroin abuse (hospitalized earlier this month for methadone withdrawal) who presents with fever, nausea/vomiting since yesterday. Pt states she  has also had "pain in her kidneys" x 2 weeks. She describes the pain as 8/10, sharp, constant, worse on the right side, worse with movement. Pt states she had a fever to 103F yesterday with chills. She has taken acetominophen, children's Nyquil, and two percocets for pain, with little relief.  Pt has had urinary frequency x 1 week and endorses dysuria and urgency. She also states that this morning, she noticed a small amount of blood on the toilet paper after wiping. Pt states she has had nausea and vomiting x 2-3 since yesterday and had one loose stool, non-bloody, this morning. She has had decreased PO intake and decreased appetite as a result.  Pt is also complaining of right ear pain without drainage. She states that the ear is tender to touch. She has not been swimming in either pools or lakes recently.  Of note, pt has not taken methadone since prior to her last hospitalization and does endorse any illicit drugs other than THC. Pt also has never taken levothyroxine in the outpatient setting.    Hospital Course by problem list: 1. Pyelonephritis- On admission 7/26, pt complaining of back pain x 2 weeks with nausea/vomiting x 2-3 episodes, urinary sx including frequency, urgency, and mild hematuria.  Pt had WBC of 21.5 with left shift.  Her urinalysis was remarkable for moderate Hgb, positive nitrite, small leukesterase, 7-10 WBCs. Pt endorsed fever at home to 103F with associated chills although she was afebrile throughout her admission. Pt was started on ciprofloxacin 500mg  PO BID and given IVF at 75cc/hr.  Today, pt no longer having subjective fevers, no urinary frequency or hematuria, no n/v, CVA tenderness improved. WBC down to 11.5.  At no point during this admission did pt meet SIRS criteria. Urine culture with E. Coli, sensitivities pending.  Of note, pt had an asymptomatic UTI (enterococcus) that was not treated at her last admission earlier this month. UDS positive for THC and benzos only. LFTS  within normal limits except for albumin of 2.6, lipase within normal limits.   Pt was discharged on 12 more days of ciprofloxacin 500mg  PO BID as well as tramadol and flexeril prn for pain, phenergan prn for nausea.  2. Ear pain, resolved- Pt complaining of right ear pain on admission, tender to touch; pt has not been swimming lately. Pt was given cipro-dexamethasone otic suspension for presumed otitis externa.  Pt no longer complaining of right ear pain today. Since symptoms have resolved faster than would be expected with true otitis externa, this issue may have represented only mild irritation of exterior ear canal.  Therefore, pt was not discharged on otic drops.   3. Tobacco abuse- Pt smokes 1.5PPD, encouraged to quit but seems to be precomtemplative at this time.  Pt was given nicotine patch while inpatient.  Discharge Vitals:   BP 124/75  Pulse 56  Temp(Src) 97.8 F (36.6 C) (Oral)  Resp 16  SpO2 100%  Discharge Labs:  Results for orders placed during the hospital encounter of 02/28/13 (from the past 24 hour(s))  LIPASE, BLOOD     Status: None   Collection Time    02/28/13  7:50 PM      Result Value Range   Lipase 54  11 - 59 U/L  HEPATIC FUNCTION PANEL     Status: Abnormal   Collection Time    02/28/13  7:50 PM      Result Value Range   Total Protein 6.1  6.0 - 8.3 g/dL   Albumin 2.8 (*) 3.5 - 5.2 g/dL   AST 15  0 - 37 U/L   ALT 16  0 - 35 U/L   Alkaline Phosphatase 71  39 - 117 U/L   Total Bilirubin 0.1 (*) 0.3 - 1.2 mg/dL   Bilirubin, Direct <1.6  0.0 - 0.3 mg/dL   Indirect Bilirubin NOT CALCULATED  0.3 - 0.9 mg/dL  CBC     Status: Abnormal   Collection Time    03/01/13  6:25 AM      Result Value Range   WBC 11.5 (*) 4.0 - 10.5 K/uL   RBC 4.09  3.87 - 5.11 MIL/uL   Hemoglobin 12.2  12.0 - 15.0 g/dL   HCT 10.9  60.4 - 54.0 %   MCV 91.0  78.0 - 100.0 fL   MCH 29.8  26.0 - 34.0 pg   MCHC 32.8  30.0 - 36.0 g/dL   RDW 98.1  19.1 - 47.8 %   Platelets 188  150 -  400 K/uL  COMPREHENSIVE METABOLIC PANEL     Status: Abnormal   Collection Time    03/01/13  6:25 AM      Result Value Range   Sodium 136  135 - 145 mEq/L   Potassium 4.5  3.5 - 5.1 mEq/L   Chloride 106  96 - 112 mEq/L   CO2 20  19 - 32 mEq/L   Glucose, Bld 80  70 - 99 mg/dL   BUN 6  6 - 23 mg/dL   Creatinine, Ser 2.95 (*) 0.50 - 1.10 mg/dL   Calcium 8.8  8.4 - 62.1 mg/dL   Total Protein 6.0  6.0 - 8.3 g/dL   Albumin 2.6 (*) 3.5 - 5.2 g/dL   AST 19  0 - 37 U/L   ALT 15  0 - 35 U/L   Alkaline Phosphatase 68  39 - 117 U/L   Total Bilirubin 0.2 (*) 0.3 - 1.2 mg/dL   GFR calc non Af Amer >90  >90 mL/min   GFR calc Af Amer >90  >90 mL/min    Signed: Rocco Serene, MD 03/01/2013, 2:44 PM   Time Spent on Discharge: 35 minutes Services Ordered on Discharge: none Equipment Ordered on Discharge: none

## 2013-03-01 NOTE — Progress Notes (Signed)
   I have seen the patient and reviewed the daily progress note by Roxanne Mins and discussed the care of the patient with them.  See my separate note for full details.   Rocco Serene, MD 03/01/2013, 3:24 PM

## 2013-03-01 NOTE — Progress Notes (Signed)
Subjective:   Tammy Winters remained afebrile overnight but complained of pain multiple times. Tramadol was increased to 100mg  Q6, ambien 5mg  was added and a heating pad was given for better management.   This morning Tammy Winters reports significant decrease in severity of flank and back pain, stating she is now just sore. She reports the nausea and vomiting has resolved and appetite has improved. She continues to endorse polydipsia but denies dysuria, hematuria and suprapubic pain this morning. She continues to report sinus congestion, sore throat and non productive cough, but is no longer complaining of right ear pain. She is ready for discharge today and requested we send her home on the pain meds she has been receiving in the hospital (tramadol, flexeril and tylenol).  Objective: Vital signs in last 24 hours: Filed Vitals:   02/28/13 1113 02/28/13 1349 02/28/13 2023 03/01/13 0700  BP: 98/64 110/66 119/69 124/75  Pulse: 81 68 69 56  Temp: 97.7 F (36.5 C) 98.4 F (36.9 C) 98.3 F (36.8 C) 97.8 F (36.6 C)  TempSrc: Oral     Resp: 17 16 16 16   SpO2: 98% 98% 100% 100%   Weight change:   Intake/Output Summary (Last 24 hours) at 03/01/13 1610 Last data filed at 03/01/13 0700  Gross per 24 hour  Intake   1080 ml  Output      2 ml  Net   1078 ml   Physical Exam General: Well appearing female, alert and engaged, looks much more comfortable today HEENT: pupils round and reactive to light, vision grossly intact, moist mucous membranes Heart: regular rate and rhythm, no murmurs, gallops or rubs appreciated  Lungs: normal respiratory effort, lungs clear to ascultation bilaterally, no wheezes, rales or ronchi  Abdomen: normal active bowel sounds, soft, non tender abdomen, no rebound or guarding, mild tenderness upon palpation of right back and flank, no CVA tenderness, no perispinal muscle tenderness  Neuro: alert and oriented x3, cranial nerves II-XII grossly intact, strength and sensation  grossly intact bilaterally  Lab Results: @LABTEST2 @ Micro Results: No results found for this or any previous visit (from the past 240 hour(s)). Studies/Results: No results found. Medications: I have reviewed the patient's current medications. Scheduled Meds: . ciprofloxacin  500 mg Oral BID  . ciprofloxacin-dexamethasone  4 drop Right Ear BID  . enoxaparin (LOVENOX) injection  40 mg Subcutaneous Q24H  . nicotine  21 mg Transdermal Daily  . pantoprazole  40 mg Oral Daily   Continuous Infusions: . sodium chloride 75 mL/hr (03/01/13 0034)   PRN Meds:.acetaminophen, acetaminophen, cyclobenzaprine, ibuprofen, promethazine, traMADol Assessment/Plan:     Tammy Winters is a 27 year old caucasian female with a PMH of opiod addiction, anxiety, GERD and peptic ulcer disease who presents with flank pain, fever, nausea, and vomiting in the context of a UTI, most consistent with pyelonephritis.   # Acute Pyelonephritis   Patient admitted for acute pyelonephritis. Patient reports improved bilateral flank pain, resolved nausea, vomiting, dysuria and subjective hematuria. On exam, she reported no CVA or perispinal tenderness, improved from admission.  Additionally she has remained afebrile and WBC count trending down from 21.5 to 11.5.  Resolution of signs and symptoms following antibiotic therapy supports the diagnosis of pyelonephritis. Presentation was also concerning for perispinal abscess, acute hepatitis, pancreatitis, nephrolithiasis, cholecystitis, or small bowel obstruction. Perispinal abscess was indicated based on history of IV drug use and questionable perispinal tenderness on exam. However, resolution of perispinal tenderness, and UDS negative except for THC and benzos rule  out this diagnosis. Acute hepatitis ruled out based on LFTs within normal limits. Acute pancreatitis ruled out based on lipase within normal limits. Nephrolithiasis ruled out based on 2 week history of flank pain, significantly  elevated WBC, and resolution of sings and symptoms following antibiotics. Cholecystitis ruled out based on prior history of cholecystectomy. Small bowel obstruction ruled out as patient is moving bowels regularly and reoported one episode of lose stool.Pregnancy and associated complications can be ruled out based on negative pregnancy test. Plan for pyelonephritis is as follows: - Discharge today   - Recommend follow up and establish care with a PCP - Oral Ciprofloxacin 500mg  BID for 14 days (today day 2/14) for pyelonephritis  -Tylenol 650mg  oral PRN for rever/ pain  - Flexeril 5mg  oral PRN for pain  - Tramadol 100mg  oral PRN for pain  - Urine culture pending - follow up   # Right Otitis Externa   Patient no longer complaining of symptoms, such as ear pain or pressure. Since symptoms resolved more rapidly than expected for otitis externa, right ear pain was most likely due to mild exterior irritation of the auditory canal in the context of her sinus congestion and cold symptoms. Ciprofloxacin-dexamethosone 0.3-0.1% otic suspension drops discontinued on discharge.   #Diabetes Mellitus Type I  DM I was recorded in past medical history. However, given the patient is not currently on any medication, is maintaining normal blood sugar levels and has an A1C of 5.0% as of 02/06/13, she does not have DM type I at this time.   # Hypothyroidism  Hypothyroid was recorded in past medical history. However, given the patient is not currently taking synthroid, has a TSH of 2.253 as of 02/08/13 , and is asymptomatic, she does not have hypothyroidism at this time.   #Seizure disorder  Witnessed seizures were recorded in past medical history following methadone withdrawal. Given the patient is not currently taking the prescribed keppra and seizures were documented in the context of withdrawal, medication is not required at this time.   This is a Psychologist, occupational Note.  The care of the patient was discussed with Dr.  Aundria Rud and the assessment and plan formulated with their assistance.  Please see their attached note for official documentation of the daily encounter.   LOS: 1 day   Everardo All, Med Student 03/01/2013, 9:23 AM

## 2013-03-01 NOTE — H&P (Signed)
Medical Service: Internal Medicine Teaching Service     I have reviewed the note by Roxanne Mins and was present during the interview and physical exam.  Please see my separate note for full details.    Signed: Rocco Serene, MD 03/01/2013, 3:23 PM

## 2013-03-02 LAB — URINE CULTURE

## 2013-03-08 ENCOUNTER — Emergency Department (HOSPITAL_COMMUNITY)
Admission: EM | Admit: 2013-03-08 | Discharge: 2013-03-08 | Disposition: A | Discharge status: Home / Self Care | Payer: Medicaid Other | Attending: Emergency Medicine | Admitting: Emergency Medicine

## 2013-03-08 ENCOUNTER — Encounter (HOSPITAL_COMMUNITY): Payer: Self-pay | Admitting: Emergency Medicine

## 2013-03-08 DIAGNOSIS — M545 Low back pain, unspecified: Secondary | ICD-10-CM | POA: Insufficient documentation

## 2013-03-08 DIAGNOSIS — K219 Gastro-esophageal reflux disease without esophagitis: Secondary | ICD-10-CM | POA: Insufficient documentation

## 2013-03-08 DIAGNOSIS — R509 Fever, unspecified: Secondary | ICD-10-CM | POA: Insufficient documentation

## 2013-03-08 DIAGNOSIS — F17200 Nicotine dependence, unspecified, uncomplicated: Secondary | ICD-10-CM | POA: Insufficient documentation

## 2013-03-08 DIAGNOSIS — R109 Unspecified abdominal pain: Secondary | ICD-10-CM | POA: Insufficient documentation

## 2013-03-08 DIAGNOSIS — Z8669 Personal history of other diseases of the nervous system and sense organs: Secondary | ICD-10-CM | POA: Insufficient documentation

## 2013-03-08 DIAGNOSIS — Z8719 Personal history of other diseases of the digestive system: Secondary | ICD-10-CM | POA: Insufficient documentation

## 2013-03-08 DIAGNOSIS — M549 Dorsalgia, unspecified: Secondary | ICD-10-CM | POA: Insufficient documentation

## 2013-03-08 DIAGNOSIS — G8929 Other chronic pain: Secondary | ICD-10-CM | POA: Insufficient documentation

## 2013-03-08 DIAGNOSIS — Z79899 Other long term (current) drug therapy: Secondary | ICD-10-CM | POA: Insufficient documentation

## 2013-03-08 DIAGNOSIS — F1110 Opioid abuse, uncomplicated: Secondary | ICD-10-CM | POA: Insufficient documentation

## 2013-03-08 DIAGNOSIS — F411 Generalized anxiety disorder: Secondary | ICD-10-CM | POA: Insufficient documentation

## 2013-03-08 DIAGNOSIS — F1920 Other psychoactive substance dependence, uncomplicated: Secondary | ICD-10-CM | POA: Insufficient documentation

## 2013-03-08 LAB — CBC
MCH: 31.5 pg (ref 26.0–34.0)
MCHC: 35.4 g/dL (ref 30.0–36.0)
MCV: 89.2 fL (ref 78.0–100.0)
Platelets: 354 10*3/uL (ref 150–400)
RDW: 13.6 % (ref 11.5–15.5)
WBC: 13.6 10*3/uL — ABNORMAL HIGH (ref 4.0–10.5)

## 2013-03-08 LAB — COMPREHENSIVE METABOLIC PANEL
AST: 21 U/L (ref 0–37)
Albumin: 3.7 g/dL (ref 3.5–5.2)
Calcium: 9.6 mg/dL (ref 8.4–10.5)
Creatinine, Ser: 0.45 mg/dL — ABNORMAL LOW (ref 0.50–1.10)

## 2013-03-08 LAB — URINALYSIS, ROUTINE W REFLEX MICROSCOPIC
Bilirubin Urine: NEGATIVE
Protein, ur: NEGATIVE mg/dL
Urobilinogen, UA: 0.2 mg/dL (ref 0.0–1.0)

## 2013-03-08 LAB — URINE MICROSCOPIC-ADD ON

## 2013-03-08 MED ORDER — HYDROCODONE-ACETAMINOPHEN 5-325 MG PO TABS: 2.0000 | ORAL_TABLET | ORAL | Status: DC | PRN

## 2013-03-08 MED ORDER — HYDROCODONE-ACETAMINOPHEN 5-325 MG PO TABS
1.0000 | ORAL_TABLET | Freq: Once | ORAL | Status: AC
  Administered 2013-03-08: 1 via ORAL
  Filled 2013-03-08 (×2): qty 1

## 2013-03-08 MED ORDER — PROMETHAZINE HCL 25 MG PO TABS
25.0000 mg | ORAL_TABLET | Freq: Once | ORAL | Status: AC
  Administered 2013-03-08: 25 mg via ORAL
  Filled 2013-03-08: qty 1

## 2013-03-08 NOTE — ED Notes (Signed)
Dr Blinda Leatherwood at bedside  Pt states pain in back "near kidneys." Pt states really nauseous and really hot. Pt skin red in color and dry. PT warm to touch. Pt states she has not gotten better she has been getting worse.

## 2013-03-08 NOTE — ED Provider Notes (Signed)
CSN: [id number]     Arrival date & time 03/08/13  1745 History     First MD Initiated Contact with Patient 03/08/13 1759     Chief Complaint  Patient presents with  . Urinary Tract Infection  . Back Pain   (Consider location/radiation/quality/duration/timing/severity/associated sxs/prior Treatment) HPI Comments: Patient comes to the emergency department for evaluation of continued moderate to severe bilateral flank pain, right greater than left with fever and chills. Patient reports that she was admitted to the hospital a week ago for pyelonephritis. She reports that she improved with the IV medications in hospital, but since leaving she has had recurrence of the symptoms. She has been taking the Cipro but it has not improved. Patient has not had any vomiting.  Patient is a 27 y.o. female presenting with urinary tract infection and back pain.  Urinary Tract Infection  Back Pain Associated symptoms: fever     Past Medical History  Diagnosis Date  . Drug addict   . Heroin abuse   . Anxiety   . GERD (gastroesophageal reflux disease)   . History of stomach ulcers   . 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 Chronic lower back pain    Past Surgical History  Procedure Laterality Date  . Cholecystectomy    . Dilation and curettage of uterus      "I've had 3" (01/26/2013)  . Multiple tooth extractions      "I've had all my teeth removed" (01/26/2013)   Family History  Problem Relation Age of Onset  . Diabetes Mother   . Cancer Mother   . Diabetes Father   . Cancer Father    History  Substance Use Topics  . Smoking status: Current Some Day Smoker -- 1.50 packs/day for 12 years    Types: Cigarettes  . Smokeless tobacco: Never Used  . Alcohol Use: Yes     Comment: one beer per week   OB History   Grav Para Term Preterm Abortions TAB SAB Ect Mult Living            2     Review of Systems  Constitutional: Positive for fever.  Genitourinary: Positive for flank  pain.  Musculoskeletal: Positive for back pain.    Allergies  Adhesive; Reglan; and Zofran  Home Medications   Current Outpatient Rx  Name  Route  Sig  Dispense  Refill  . acetaminophen (TYLENOL) 325 MG tablet   Oral   Take 650 mg by mouth 3 (three) times daily as needed for pain.         . ciprofloxacin (CIPRO) 500 MG tablet   Oral   Take 1 tablet (500 mg total) by mouth 2 (two) times daily.   12 tablet   0   . cyclobenzaprine (FLEXERIL) 5 MG tablet   Oral   Take 1 tablet (5 mg total) by mouth 3 (three) times daily as needed for muscle spasms.   30 tablet   0   . promethazine (PHENERGAN) 12.5 MG tablet   Oral   Take 1 tablet (12.5 mg total) by mouth every 8 (eight) hours as needed for nausea.   30 tablet   0   . traMADol (ULTRAM) 50 MG tablet   Oral   Take 2 tablets (100 mg total) by mouth every 6 (six) hours as needed.   30 tablet   0    BP 132/91  Pulse 111  Temp(Src) 100.6 F (38.1 C)  Resp  20  Ht 5\' 3"  (1.6 m)  Wt 145 lb (65.772 kg)  BMI 25.69 kg/m2  SpO2 97%  LMP 02/17/2013 Physical Exam  Constitutional: She is oriented to person, place, and time. She appears well-developed and well-nourished. No distress.  HENT:  Head: Normocephalic and atraumatic.  Right Ear: Hearing normal.  Left Ear: Hearing normal.  Nose: Nose normal.  Mouth/Throat: Oropharynx is clear and moist and mucous membranes are normal.  Eyes: Conjunctivae and EOM are normal. Pupils are equal, round, and reactive to light.  Neck: Normal range of motion. Neck supple.  Cardiovascular: Regular rhythm, S1 normal and S2 normal.  Exam reveals no gallop and no friction rub.   No murmur heard. Pulmonary/Chest: Effort normal and breath sounds normal. No respiratory distress. She exhibits no tenderness.  Abdominal: Soft. Normal appearance and bowel sounds are normal. There is no hepatosplenomegaly. There is no tenderness. There is CVA tenderness (right side). There is no rebound, no guarding,  no tenderness at McBurney's point and negative Murphy's sign. No hernia.  Musculoskeletal: Normal range of motion.  Neurological: She is alert and oriented to person, place, and time. She has normal strength. No cranial nerve deficit or sensory deficit. Coordination normal. GCS eye subscore is 4. GCS verbal subscore is 5. GCS motor subscore is 6.  Skin: Skin is warm, dry and intact. No rash noted. No cyanosis.  Psychiatric: She has a normal mood and affect. Her speech is normal and behavior is normal. Thought content normal.    ED Course   Procedures (including critical care time)  Labs Reviewed  URINALYSIS, ROUTINE W REFLEX MICROSCOPIC - Abnormal; Notable for the following:    Hgb urine dipstick LARGE (*)    All other components within normal limits  CBC - Abnormal; Notable for the following:    WBC 13.6 (*)    All other components within normal limits  COMPREHENSIVE METABOLIC PANEL - Abnormal; Notable for the following:    Creatinine, Ser 0.45 (*)    Total Bilirubin 0.1 (*)    All other components within normal limits  URINE CULTURE  CULTURE, BLOOD (ROUTINE X 2)  CULTURE, BLOOD (ROUTINE X 2)  URINE MICROSCOPIC-ADD ON   No results found.  Diagnosis: 1. Low-grade fever 2. Back Pain  MDM  Patient comes to the ER for evaluation of continued back pain. Patient reports that she was admitted a week ago for similar. She is still experiencing the pain. Patient is concerned because she is taking the antibiotics and is not improving. She did have a low-grade fever of 100.6 on arrival. Patient's workup is entirely unremarkable. Urinalysis shows normal findings, no concern for continued infection. Patient does have a history of chronic back pain. Pain that she is experiencing is unlikely to have been related to the urinary tract infection she was treated for. Patient was reassured, will be discharged. She can continue to treat her fever. Additionally given analgesia.  Gilda Crease,  MD 03/08/13 2030

## 2013-03-08 NOTE — ED Notes (Addendum)
Pt c/o continue to have symptoms of UTI and kidney infection. Pt seen here in past for same.  Pt currently taking Cipro, last dose taken was yesterday. Pt unsure if she may have miss some days. Pt also report nausea. Pt c/o pain with intercourse.

## 2013-03-10 LAB — URINE CULTURE
Colony Count: NO GROWTH
Culture: NO GROWTH

## 2013-03-11 ENCOUNTER — Inpatient Hospital Stay (HOSPITAL_COMMUNITY)
Admission: AD | Admit: 2013-03-11 | Discharge: 2013-03-11 | Disposition: A | Discharge status: Home / Self Care | Payer: Self-pay | Source: Ambulatory Visit | Attending: Obstetrics & Gynecology | Admitting: Obstetrics & Gynecology

## 2013-03-11 DIAGNOSIS — K2990 Gastroduodenitis, unspecified, without bleeding: Secondary | ICD-10-CM

## 2013-03-11 DIAGNOSIS — N949 Unspecified condition associated with female genital organs and menstrual cycle: Secondary | ICD-10-CM | POA: Insufficient documentation

## 2013-03-11 DIAGNOSIS — R109 Unspecified abdominal pain: Secondary | ICD-10-CM | POA: Insufficient documentation

## 2013-03-11 DIAGNOSIS — F1910 Other psychoactive substance abuse, uncomplicated: Secondary | ICD-10-CM | POA: Insufficient documentation

## 2013-03-11 DIAGNOSIS — N938 Other specified abnormal uterine and vaginal bleeding: Secondary | ICD-10-CM | POA: Insufficient documentation

## 2013-03-11 DIAGNOSIS — K2970 Gastritis, unspecified, without bleeding: Secondary | ICD-10-CM

## 2013-03-11 DIAGNOSIS — K2900 Acute gastritis without bleeding: Secondary | ICD-10-CM | POA: Insufficient documentation

## 2013-03-11 LAB — URINALYSIS, ROUTINE W REFLEX MICROSCOPIC
Bilirubin Urine: NEGATIVE
Glucose, UA: NEGATIVE mg/dL
Protein, ur: NEGATIVE mg/dL
Specific Gravity, Urine: 1.015 (ref 1.005–1.030)
Urobilinogen, UA: 0.2 mg/dL (ref 0.0–1.0)

## 2013-03-11 LAB — CBC
HCT: 37.2 % (ref 36.0–46.0)
Hemoglobin: 12.8 g/dL (ref 12.0–15.0)
MCH: 30.3 pg (ref 26.0–34.0)
MCV: 87.9 fL (ref 78.0–100.0)
RBC: 4.23 MIL/uL (ref 3.87–5.11)

## 2013-03-11 LAB — COMPREHENSIVE METABOLIC PANEL
ALT: 24 U/L (ref 0–35)
Alkaline Phosphatase: 75 U/L (ref 39–117)
BUN: 9 mg/dL (ref 6–23)
CO2: 26 mEq/L (ref 19–32)
Chloride: 102 mEq/L (ref 96–112)
GFR calc Af Amer: >90 mL/min (ref >90)
Glucose, Bld: 91 mg/dL (ref 70–99)
Potassium: 4.1 mEq/L (ref 3.5–5.1)
Total Bilirubin: 0.2 mg/dL — ABNORMAL LOW (ref 0.3–1.2)

## 2013-03-11 LAB — AMYLASE: Amylase: 65 U/L (ref 0–105)

## 2013-03-11 LAB — RAPID URINE DRUG SCREEN, HOSP PERFORMED
Amphetamines: NOT DETECTED
Barbiturates: NOT DETECTED
Cocaine: NOT DETECTED
Tetrahydrocannabinol: NOT DETECTED

## 2013-03-11 LAB — URINE MICROSCOPIC-ADD ON

## 2013-03-11 MED ORDER — PROMETHAZINE HCL 25 MG/ML IJ SOLN
25.0000 mg | Freq: Once | INTRAMUSCULAR | Status: AC
  Administered 2013-03-11: 25 mg via INTRAMUSCULAR
  Filled 2013-03-11: qty 1

## 2013-03-11 MED ORDER — PROMETHAZINE HCL 12.5 MG PO TABS: 25.0000 mg | ORAL_TABLET | Freq: Three times a day (TID) | ORAL | Status: DC | PRN

## 2013-03-11 MED ORDER — HYDROMORPHONE HCL PF 1 MG/ML IJ SOLN
1.0000 mg | Freq: Once | INTRAMUSCULAR | Status: AC
  Administered 2013-03-11: 1 mg via INTRAMUSCULAR
  Filled 2013-03-11: qty 1

## 2013-03-11 MED ORDER — FAMOTIDINE 20 MG PO TABS: 20.0000 mg | ORAL_TABLET | Freq: Two times a day (BID) | ORAL | Status: DC

## 2013-03-11 NOTE — MAU Note (Signed)
Faint HPT a couple days ago.

## 2013-03-11 NOTE — MAU Note (Signed)
Heavy bleeding -this morning (changed pad 3x in an hour- lots of clots), started last night was bright red. Upper abd pain started about ago.

## 2013-03-11 NOTE — MAU Provider Note (Signed)
History     CSN: [id number]  Arrival date and time: 03/11/13 1818   First Provider Initiated Contact with Patient 03/11/13 1828      Chief Complaint  Patient presents with  . Threatened Miscarriage   HPI [name] is a 27 y.o. G5P2 who presents to MAU today by EMS with complaint of heavy vaginal bleeding and upper abdominal pain. The patient states that she had a +HPT 2 days ago that was very faint. She states that she started having bleeding last night that became heavier this morning. She states that the upper abdominal pain started 30 minutes prior to arrival. She denies lower abdominal pain, fever or UTI symptoms. She is nauseous without vomiting or diarrhea. She states normal BM last night. Last ate around 10 am. No history of GI issues. Recently seen at [facility] for ? Pyelonephritis. Patient is still on Cipro.   OB History   Grav Para Term Preterm Abortions TAB SAB Ect Mult Living            2      Past Medical History  Diagnosis Date  . Drug addict   . Heroin abuse   . Anxiety   . GERD (gastroesophageal reflux disease)   . History of stomach ulcers   . 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 Chronic lower back pain     Past Surgical History  Procedure Laterality Date  . Cholecystectomy    . Dilation and curettage of uterus      "I've had 3" (01/26/2013)  . Multiple tooth extractions      "I've had all my teeth removed" (01/26/2013)    Family History  Problem Relation Age of Onset  . Diabetes Mother   . Cancer Mother   . Diabetes Father   . Cancer Father     History  Substance Use Topics  . Smoking status: Current Some Day Smoker -- 1.50 packs/day for 12 years    Types: Cigarettes  . Smokeless tobacco: Never Used  . Alcohol Use: Yes     Comment: one beer per week    Allergies:  Allergies  Allergen Reactions  . Adhesive (Tape) Other (See Comments)    redness, burning, skin tears  . Reglan (Metoclopramide) Other (See Comments)   body twitching   . Zofran (Ondansetron Hcl) Other (See Comments)    Body twitching    Prescriptions prior to admission  Medication Sig Dispense Refill  . acetaminophen (TYLENOL) 325 MG tablet Take 650 mg by mouth 3 (three) times daily as needed for pain.      . ciprofloxacin (CIPRO) 500 MG tablet Take 500 mg by mouth 2 (two) times daily. X 7 days.  Started on 03/01/2013      . [DISCONTINUED] ciprofloxacin (CIPRO) 500 MG tablet Take 1 tablet (500 mg total) by mouth 2 (two) times daily.  12 tablet  0  . [DISCONTINUED] promethazine (PHENERGAN) 12.5 MG tablet Take 1 tablet (12.5 mg total) by mouth every 8 (eight) hours as needed for nausea.  30 tablet  0    Review of Systems  Constitutional: Negative for fever and malaise/fatigue.  Gastrointestinal: Positive for nausea and abdominal pain. Negative for vomiting, diarrhea and constipation.  Genitourinary: Negative for dysuria, urgency and frequency.       + vaginal bleeding Neg - vaginal discharge  Neurological: Positive for dizziness. Negative for loss of consciousness.   Physical Exam   Blood pressure 112/84, pulse 117,  temperature 99.2 F (37.3 C), temperature source Oral, resp. rate 20, last menstrual period 02/17/2013.  Physical Exam  Constitutional: She is oriented to person, place, and time. She appears well-developed and well-nourished. No distress.  HENT:  Head: Normocephalic and atraumatic.  Cardiovascular: Regular rhythm and normal heart sounds.  Tachycardia present.   Respiratory: Effort normal and breath sounds normal. No respiratory distress.  GI: Soft. Bowel sounds are normal. She exhibits no distension and no mass. There is tenderness (moderate tenderness to palpation of the epigastric region). There is no rebound and no guarding.  Genitourinary: Uterus is not enlarged and not tender. Cervix exhibits no motion tenderness, no discharge and no friability. Right adnexum displays no mass and no tenderness. Left adnexum displays  no mass and no tenderness. There is bleeding (small amount of bleeding in the vaginal vault) around the vagina. No vaginal discharge found.  Neurological: She is alert and oriented to person, place, and time.  Skin: Skin is warm and dry. No erythema.  Psychiatric: She has a normal mood and affect.   Results for orders placed during the hospital encounter of 03/11/13 (from the past 24 hour(s))  CBC     Status: Abnormal   Collection Time    03/11/13  6:40 PM      Result Value Range   WBC 15.0 (*) 4.0 - 10.5 K/uL   RBC 4.23  3.87 - 5.11 MIL/uL   Hemoglobin 12.8  12.0 - 15.0 g/dL   HCT 16.1  09.6 - 04.5 %   MCV 87.9  78.0 - 100.0 fL   MCH 30.3  26.0 - 34.0 pg   MCHC 34.4  30.0 - 36.0 g/dL   RDW 40.9  81.1 - 91.4 %   Platelets 297  150 - 400 K/uL  HCG, QUANTITATIVE, PREGNANCY     Status: None   Collection Time    03/11/13  6:40 PM      Result Value Range   hCG, Beta Chain, Quant, S <1  <5 mIU/mL  ABO/RH     Status: None   Collection Time    03/11/13  6:40 PM      Result Value Range   ABO/RH(D) AB POS    COMPREHENSIVE METABOLIC PANEL     Status: Abnormal   Collection Time    03/11/13  6:40 PM      Result Value Range   Sodium 138  135 - 145 mEq/L   Potassium 4.1  3.5 - 5.1 mEq/L   Chloride 102  96 - 112 mEq/L   CO2 26  19 - 32 mEq/L   Glucose, Bld 91  70 - 99 mg/dL   BUN 9  6 - 23 mg/dL   Creatinine, Ser 7.82  0.50 - 1.10 mg/dL   Calcium 9.8  8.4 - 95.6 mg/dL   Total Protein 7.0  6.0 - 8.3 g/dL   Albumin 3.6  3.5 - 5.2 g/dL   AST 19  0 - 37 U/L   ALT 24  0 - 35 U/L   Alkaline Phosphatase 75  39 - 117 U/L   Total Bilirubin 0.2 (*) 0.3 - 1.2 mg/dL   GFR calc non Af Amer >90  >90 mL/min   GFR calc Af Amer >90  >90 mL/min  AMYLASE     Status: None   Collection Time    03/11/13  6:40 PM      Result Value Range   Amylase 65  0 - 105 U/L  LIPASE, BLOOD  Status: None   Collection Time    03/11/13  6:40 PM      Result Value Range   Lipase 46  11 - 59 U/L  URINALYSIS,  ROUTINE W REFLEX MICROSCOPIC     Status: Abnormal   Collection Time    03/11/13  6:50 PM      Result Value Range   Color, Urine YELLOW  YELLOW   APPearance CLEAR  CLEAR   Specific Gravity, Urine 1.015  1.005 - 1.030   pH 6.0  5.0 - 8.0   Glucose, UA NEGATIVE  NEGATIVE mg/dL   Hgb urine dipstick LARGE (*) NEGATIVE   Bilirubin Urine NEGATIVE  NEGATIVE   Ketones, ur NEGATIVE  NEGATIVE mg/dL   Protein, ur NEGATIVE  NEGATIVE mg/dL   Urobilinogen, UA 0.2  0.0 - 1.0 mg/dL   Nitrite NEGATIVE  NEGATIVE   Leukocytes, UA NEGATIVE  NEGATIVE  URINE RAPID DRUG SCREEN (HOSP PERFORMED)     Status: Abnormal   Collection Time    03/11/13  6:50 PM      Result Value Range   Opiates POSITIVE (*) NONE DETECTED   Cocaine NONE DETECTED  NONE DETECTED   Benzodiazepines NONE DETECTED  NONE DETECTED   Amphetamines NONE DETECTED  NONE DETECTED   Tetrahydrocannabinol NONE DETECTED  NONE DETECTED   Barbiturates NONE DETECTED  NONE DETECTED  URINE MICROSCOPIC-ADD ON     Status: None   Collection Time    03/11/13  6:50 PM      Result Value Range   Squamous Epithelial / LPF RARE  RARE   WBC, UA 0-2  <3 WBC/hpf   RBC / HPF 21-50  <3 RBC/hpf    MAU Course  Procedures None  MDM 1 mg Dilaudid IM and 25 mg Phenergan IM given - patient reports significant improvement in symptoms CBC, CMP, Amylase, Lipase, quant hCG, UDS and UA today Labs are within normal limits Patient is not pregnant. Bleeding is not heavy on exam, similar to a normal period.  UDS + for opiates today. Patient states only medications are Cipro and Tylenol. + for THC and Benzodiazepines 1 week ago in Epic. Previous + cocaine < 2 months ago.   Assessment and Plan  A: Acute gastritis Polysubstance abuse  P: Discharge home Rx for Phenergan and Pepcid sent to patient's pharmacy Patient advised to follow-up with PCP of choice or [facility] or [facility] if symptoms persist or worsen AVS contains contact information for [facility], [facility] and [facility]  outpatient clinic  Freddi Starr, PA-C  03/11/2013, 8:13 PM

## 2013-03-15 LAB — CULTURE, BLOOD (ROUTINE X 2): Culture: NO GROWTH

## 2013-05-10 ENCOUNTER — Emergency Department: Payer: Self-pay | Admitting: Emergency Medicine

## 2013-05-10 LAB — COMPREHENSIVE METABOLIC PANEL
Anion Gap: 6 — ABNORMAL LOW (ref 7–16)
BUN: 8 mg/dL (ref 7–18)
Bilirubin,Total: 0.3 mg/dL (ref 0.2–1.0)
Calcium, Total: 9 mg/dL (ref 8.5–10.1)
Chloride: 108 mmol/L — ABNORMAL HIGH (ref 98–107)
Co2: 25 mmol/L (ref 21–32)
EGFR (African American): >60
Glucose: 107 mg/dL — ABNORMAL HIGH (ref 65–99)
Osmolality: 276 (ref 275–301)
Sodium: 139 mmol/L (ref 136–145)
Total Protein: 7.3 g/dL (ref 6.4–8.2)

## 2013-05-10 LAB — DRUG SCREEN, URINE
Barbiturates, Ur Screen: NEGATIVE (ref ?–200)
Cannabinoid 50 Ng, Ur ~~LOC~~: POSITIVE (ref ?–50)
Cocaine Metabolite,Ur ~~LOC~~: NEGATIVE (ref ?–300)
Opiate, Ur Screen: NEGATIVE (ref ?–300)
Tricyclic, Ur Screen: NEGATIVE (ref ?–1000)

## 2013-05-10 LAB — URINALYSIS, COMPLETE
Bilirubin,UR: NEGATIVE
Glucose,UR: NEGATIVE mg/dL (ref 0–75)
Nitrite: POSITIVE
Ph: 5 (ref 4.5–8.0)
Protein: NEGATIVE
RBC,UR: 79 /HPF (ref 0–5)
Specific Gravity: 1.014 (ref 1.003–1.030)
WBC UR: 7 /HPF (ref 0–5)

## 2013-05-10 LAB — CBC
MCHC: 34.8 g/dL (ref 32.0–36.0)
Platelet: 230 10*3/uL (ref 150–440)

## 2013-05-10 LAB — ETHANOL: Ethanol %: <0.003 % (ref 0.000–0.080)

## 2013-05-21 ENCOUNTER — Emergency Department: Payer: Self-pay | Admitting: Emergency Medicine

## 2013-05-21 LAB — COMPREHENSIVE METABOLIC PANEL
Albumin: 3.9 g/dL (ref 3.4–5.0)
Anion Gap: 5 — ABNORMAL LOW (ref 7–16)
Bilirubin,Total: 0.5 mg/dL (ref 0.2–1.0)
Calcium, Total: 9.3 mg/dL (ref 8.5–10.1)
Chloride: 106 mmol/L (ref 98–107)
Co2: 26 mmol/L (ref 21–32)
Creatinine: 0.61 mg/dL (ref 0.60–1.30)
EGFR (Non-African Amer.): >60
Glucose: 88 mg/dL (ref 65–99)
SGPT (ALT): 319 U/L — ABNORMAL HIGH (ref 12–78)
Sodium: 137 mmol/L (ref 136–145)
Total Protein: 7.9 g/dL (ref 6.4–8.2)

## 2013-05-21 LAB — CBC
HCT: 42 % (ref 35.0–47.0)
HGB: 14.5 g/dL (ref 12.0–16.0)
MCV: 89 fL (ref 80–100)
Platelet: 241 10*3/uL (ref 150–440)
RBC: 4.72 10*6/uL (ref 3.80–5.20)

## 2013-05-21 LAB — URINALYSIS, COMPLETE
Glucose,UR: NEGATIVE mg/dL (ref 0–75)
Ketone: NEGATIVE
RBC,UR: 5 /HPF (ref 0–5)
WBC UR: 12 /HPF (ref 0–5)

## 2013-05-21 LAB — T4, FREE: Free Thyroxine: 1.44 ng/dL (ref 0.76–1.46)

## 2013-05-21 LAB — TSH: Thyroid Stimulating Horm: 0.392 u[IU]/mL — ABNORMAL LOW

## 2013-06-11 ENCOUNTER — Other Ambulatory Visit: Payer: Self-pay

## 2013-08-23 ENCOUNTER — Encounter (HOSPITAL_COMMUNITY): Payer: Self-pay | Admitting: Emergency Medicine

## 2013-08-23 ENCOUNTER — Emergency Department (HOSPITAL_COMMUNITY): Payer: Self-pay

## 2013-08-23 ENCOUNTER — Emergency Department (HOSPITAL_COMMUNITY)
Admission: EM | Admit: 2013-08-23 | Discharge: 2013-08-23 | Disposition: A | Discharge status: Home / Self Care | Payer: Self-pay | Attending: Emergency Medicine | Admitting: Emergency Medicine

## 2013-08-23 DIAGNOSIS — IMO0002 Reserved for concepts with insufficient information to code with codable children: Secondary | ICD-10-CM | POA: Insufficient documentation

## 2013-08-23 DIAGNOSIS — Z8669 Personal history of other diseases of the nervous system and sense organs: Secondary | ICD-10-CM | POA: Insufficient documentation

## 2013-08-23 DIAGNOSIS — Z9089 Acquired absence of other organs: Secondary | ICD-10-CM | POA: Insufficient documentation

## 2013-08-23 DIAGNOSIS — Z8711 Personal history of peptic ulcer disease: Secondary | ICD-10-CM | POA: Insufficient documentation

## 2013-08-23 DIAGNOSIS — G8929 Other chronic pain: Secondary | ICD-10-CM | POA: Insufficient documentation

## 2013-08-23 DIAGNOSIS — O9989 Other specified diseases and conditions complicating pregnancy, childbirth and the puerperium: Secondary | ICD-10-CM | POA: Insufficient documentation

## 2013-08-23 DIAGNOSIS — R350 Frequency of micturition: Secondary | ICD-10-CM | POA: Insufficient documentation

## 2013-08-23 DIAGNOSIS — R109 Unspecified abdominal pain: Secondary | ICD-10-CM

## 2013-08-23 DIAGNOSIS — N938 Other specified abnormal uterine and vaginal bleeding: Secondary | ICD-10-CM | POA: Insufficient documentation

## 2013-08-23 DIAGNOSIS — Z9889 Other specified postprocedural states: Secondary | ICD-10-CM | POA: Insufficient documentation

## 2013-08-23 DIAGNOSIS — Z8659 Personal history of other mental and behavioral disorders: Secondary | ICD-10-CM | POA: Insufficient documentation

## 2013-08-23 DIAGNOSIS — Z8719 Personal history of other diseases of the digestive system: Secondary | ICD-10-CM | POA: Insufficient documentation

## 2013-08-23 DIAGNOSIS — R1032 Left lower quadrant pain: Secondary | ICD-10-CM | POA: Insufficient documentation

## 2013-08-23 DIAGNOSIS — N925 Other specified irregular menstruation: Secondary | ICD-10-CM | POA: Insufficient documentation

## 2013-08-23 DIAGNOSIS — Z3490 Encounter for supervision of normal pregnancy, unspecified, unspecified trimester: Secondary | ICD-10-CM

## 2013-08-23 DIAGNOSIS — R197 Diarrhea, unspecified: Secondary | ICD-10-CM | POA: Insufficient documentation

## 2013-08-23 DIAGNOSIS — R300 Dysuria: Secondary | ICD-10-CM | POA: Insufficient documentation

## 2013-08-23 DIAGNOSIS — O99330 Smoking (tobacco) complicating pregnancy, unspecified trimester: Secondary | ICD-10-CM | POA: Insufficient documentation

## 2013-08-23 DIAGNOSIS — N949 Unspecified condition associated with female genital organs and menstrual cycle: Secondary | ICD-10-CM | POA: Insufficient documentation

## 2013-08-23 LAB — CBC WITH DIFFERENTIAL/PLATELET
Basophils Absolute: 0 10*3/uL (ref 0.0–0.1)
Basophils Relative: 0 % (ref 0–1)
Eosinophils Absolute: 0.2 10*3/uL (ref 0.0–0.7)
Eosinophils Relative: 2 % (ref 0–5)
HCT: 38.7 % (ref 36.0–46.0)
HEMOGLOBIN: 13.2 g/dL (ref 12.0–15.0)
LYMPHS ABS: 2.9 10*3/uL (ref 0.7–4.0)
LYMPHS PCT: 29 % (ref 12–46)
MCH: 30.2 pg (ref 26.0–34.0)
MCHC: 34.1 g/dL (ref 30.0–36.0)
MCV: 88.6 fL (ref 78.0–100.0)
Monocytes Absolute: 0.6 10*3/uL (ref 0.1–1.0)
Monocytes Relative: 6 % (ref 3–12)
NEUTROS PCT: 63 % (ref 43–77)
Neutro Abs: 6.3 10*3/uL (ref 1.7–7.7)
PLATELETS: 263 10*3/uL (ref 150–400)
RBC: 4.37 MIL/uL (ref 3.87–5.11)
RDW: 13.3 % (ref 11.5–15.5)
WBC: 10 10*3/uL (ref 4.0–10.5)

## 2013-08-23 LAB — URINALYSIS, ROUTINE W REFLEX MICROSCOPIC
BILIRUBIN URINE: NEGATIVE
Glucose, UA: NEGATIVE mg/dL
HGB URINE DIPSTICK: NEGATIVE
Ketones, ur: NEGATIVE mg/dL
Leukocytes, UA: NEGATIVE
Nitrite: NEGATIVE
PROTEIN: NEGATIVE mg/dL
Specific Gravity, Urine: 1.005 (ref 1.005–1.030)
UROBILINOGEN UA: 0.2 mg/dL (ref 0.0–1.0)
pH: 6.5 (ref 5.0–8.0)

## 2013-08-23 LAB — COMPREHENSIVE METABOLIC PANEL
ALK PHOS: 77 U/L (ref 39–117)
ALT: 105 U/L — AB (ref 0–35)
AST: 39 U/L — ABNORMAL HIGH (ref 0–37)
Albumin: 4 g/dL (ref 3.5–5.2)
BILIRUBIN TOTAL: 0.3 mg/dL (ref 0.3–1.2)
BUN: 9 mg/dL (ref 6–23)
CO2: 23 meq/L (ref 19–32)
Calcium: 9.3 mg/dL (ref 8.4–10.5)
Chloride: 102 mEq/L (ref 96–112)
Creatinine, Ser: 0.54 mg/dL (ref 0.50–1.10)
GLUCOSE: 85 mg/dL (ref 70–99)
POTASSIUM: 4.3 meq/L (ref 3.7–5.3)
SODIUM: 138 meq/L (ref 137–147)
Total Protein: 8 g/dL (ref 6.0–8.3)

## 2013-08-23 LAB — LIPASE, BLOOD: Lipase: 35 U/L (ref 11–59)

## 2013-08-23 LAB — WET PREP, GENITAL
CLUE CELLS WET PREP: NONE SEEN
Trich, Wet Prep: NONE SEEN
Yeast Wet Prep HPF POC: NONE SEEN

## 2013-08-23 LAB — HCG, QUANTITATIVE, PREGNANCY: HCG, BETA CHAIN, QUANT, S: 24189 m[IU]/mL — AB (ref <5)

## 2013-08-23 LAB — POCT PREGNANCY, URINE: Preg Test, Ur: POSITIVE — AB

## 2013-08-23 MED ORDER — ACETAMINOPHEN 500 MG PO TABS
1000.0000 mg | ORAL_TABLET | Freq: Once | ORAL | Status: AC
  Administered 2013-08-23: 1000 mg via ORAL
  Filled 2013-08-23: qty 2

## 2013-08-23 MED ORDER — MORPHINE SULFATE 4 MG/ML IJ SOLN
4.0000 mg | Freq: Once | INTRAMUSCULAR | Status: AC
Start: 2013-08-23 — End: 2013-08-23
  Administered 2013-08-23: 4 mg via INTRAMUSCULAR
  Filled 2013-08-23: qty 1

## 2013-08-23 MED ORDER — PROMETHAZINE HCL 25 MG PO TABS
25.0000 mg | ORAL_TABLET | Freq: Once | ORAL | Status: AC
  Administered 2013-08-23: 25 mg via ORAL
  Filled 2013-08-23: qty 1

## 2013-08-23 NOTE — ED Notes (Signed)
Pt reports lower abdominal pain x1 week, pain 7/10. Had positive pregnancy test on Friday. Lower back pain, hx of kidney issues. Reports dysuria and urinary frequency. Diarrhea and nausea present.

## 2013-08-23 NOTE — ED Provider Notes (Signed)
CSN: [id number]     Arrival date & time 08/23/13  1322 History   First MD Initiated Contact with Patient 08/23/13 1531     Chief Complaint  Patient presents with  . Abdominal Pain  . pregnant   . Dysuria   (Consider location/radiation/quality/duration/timing/severity/associated sxs/prior Treatment) HPI Comments: Patient is a 255P2 female who presents today with lower abdominal cramping. She reports that she has been having a lot of nausea recently. Last week she took a pregnancy test which was positive. Her abdominal pain is intermittent and feels like a severe menstrual cramp. The pain is worse on the left side. She has also had associated watery diarrhea. She has dysuria, urinary frequency. She has associated dyspareunia. Her last period ended 12/10. She had two periods in November. She is unsure of her blood type, but on chart review it appears she is ABO +.   The history is provided by the patient. No language interpreter was used.    Past Medical History  Diagnosis Date  . Drug addict   . Heroin abuse   . Anxiety   . GERD (gastroesophageal reflux disease)   . History of stomach ulcers   . 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 Chronic lower back pain    Past Surgical History  Procedure Laterality Date  . Cholecystectomy    . Dilation and curettage of uterus      "I've had 3" (01/26/2013)  . Multiple tooth extractions      "I've had all my teeth removed" (01/26/2013)   Family History  Problem Relation Age of Onset  . Diabetes Mother   . Cancer Mother   . Diabetes Father   . Cancer Father    History  Substance Use Topics  . Smoking status: Current Some Day Smoker -- 1.50 packs/day for 12 years    Types: Cigarettes  . Smokeless tobacco: Never Used  . Alcohol Use: Yes     Comment: one beer per week   OB History   Grav Para Term Preterm Abortions TAB SAB Ect Mult Living            2     Review of Systems  Constitutional: Negative for fever and chills.   Respiratory: Negative for shortness of breath.   Cardiovascular: Negative for chest pain.  Gastrointestinal: Positive for nausea, abdominal pain and diarrhea. Negative for vomiting.  Genitourinary: Positive for dysuria, frequency, menstrual problem, pelvic pain and dyspareunia. Negative for vaginal bleeding and vaginal discharge.  All other systems reviewed and are negative.    Allergies  Adhesive; Reglan; and Zofran  Home Medications   Current Outpatient Rx  Name  Route  Sig  Dispense  Refill  . acetaminophen (TYLENOL) 325 MG tablet   Oral   Take 650 mg by mouth 3 (three) times daily as needed for pain.          BP 111/70  Pulse 97  Temp(Src) 98.8 F (37.1 C) (Oral)  Resp 16  SpO2 99% Physical Exam  Nursing note and vitals reviewed. Constitutional: She is oriented to person, place, and time. She appears well-developed and well-nourished. No distress.  HENT:  Head: Normocephalic and atraumatic.  Right Ear: External ear normal.  Left Ear: External ear normal.  Nose: Nose normal.  Mouth/Throat: Oropharynx is clear and moist.  Eyes: Conjunctivae are normal.  Neck: Normal range of motion.  Cardiovascular: Normal rate, regular rhythm and normal heart sounds.   Pulmonary/Chest: Effort normal and  breath sounds normal. No stridor. No respiratory distress. She has no wheezes. She has no rales.  Abdominal: Soft. She exhibits no distension. There is tenderness in the suprapubic area and left lower quadrant. There is no rigidity, no rebound, no guarding and no CVA tenderness.  Genitourinary: Uterus is tender. Cervix exhibits no motion tenderness, no discharge and no friability. Right adnexum displays no mass, no tenderness and no fullness. Left adnexum displays tenderness. Left adnexum displays no mass and no fullness.  Musculoskeletal: Normal range of motion.  Neurological: She is alert and oriented to person, place, and time. She has normal strength.  Skin: Skin is warm and dry.  She is not diaphoretic. No erythema.  Psychiatric: She has a normal mood and affect. Her behavior is normal.    ED Course  Procedures (including critical care time) Labs Review Labs Reviewed  WET PREP, GENITAL - Abnormal; Notable for the following:    WBC, Wet Prep HPF POC FEW (*)    All other components within normal limits  COMPREHENSIVE METABOLIC PANEL - Abnormal; Notable for the following:    AST 39 (*)    ALT 105 (*)    All other components within normal limits  HCG, QUANTITATIVE, PREGNANCY - Abnormal; Notable for the following:    hCG, Beta Chain, Quant, S [address] (*)    All other components within normal limits  POCT PREGNANCY, URINE - Abnormal; Notable for the following:    Preg Test, Ur POSITIVE (*)    All other components within normal limits  GC/CHLAMYDIA PROBE AMP  CBC WITH DIFFERENTIAL  LIPASE, BLOOD  URINALYSIS, ROUTINE W REFLEX MICROSCOPIC   Imaging Review US Ob Comp Less 14 Wks  08/23/2013   CLINICAL DATA:  Early pregnancy, pelvic pain.  EXAM: OBSTETRIC <14 WK [place] AND TRANSVAGINAL OB US  TECHNIQUE: Both transabdominal and transvaginal ultrasound examinations were performed for complete evaluation of the gestation as well as the maternal uterus, adnexal regions, and pelvic cul-de-sac. Transvaginal technique was performed to assess early pregnancy.  COMPARISON:  None.  FINDINGS: Intrauterine gestational sac: Visualized/normal in shape.  Yolk sac:  Normal contour  Embryo:  Single  Cardiac Activity: Yes  Heart Rate:  116 bpm  CRL:   0.48 cm 6 w 1 d                  [place] EDC: April 17, 2014  Maternal uterus/adnexae: The maternal ovaries are normal in appearance. There is no free fluid within the pelvis. There is no evidence of a subchorionic hemorrhage.  IMPRESSION: There is an early IUP with estimated gestational age of [redacted] weeks 1 day which corresponds to an estimated date of confinement of April 17, 2014.   Electronically Signed   By: David  [place]   On: 08/23/2013 18:02    US Ob Transvaginal  08/23/2013   CLINICAL DATA:  Early pregnancy, pelvic pain.  EXAM: OBSTETRIC <14 WK [place] AND TRANSVAGINAL OB US  TECHNIQUE: Both transabdominal and transvaginal ultrasound examinations were performed for complete evaluation of the gestation as well as the maternal uterus, adnexal regions, and pelvic cul-de-sac. Transvaginal technique was performed to assess early pregnancy.  COMPARISON:  None.  FINDINGS: Intrauterine gestational sac: Visualized/normal in shape.  Yolk sac:  Normal contour  Embryo:  Single  Cardiac Activity: Yes  Heart Rate:  116 bpm  CRL:   0.48 cm 6 w 1 d  [place] EDC: April 17, 2014  Maternal uterus/adnexae: The maternal ovaries are normal in appearance. There is no free fluid within the pelvis. There is no evidence of a subchorionic hemorrhage.  IMPRESSION: There is an early IUP with estimated gestational age of [redacted] weeks 1 day which corresponds to an estimated date of confinement of April 17, 2014.   Electronically Signed   By: David  [place]   On: 08/23/2013 18:02    EKG Interpretation   None       MDM   1. Pregnancy   2. Abdominal pain    Pt presents for abdominal pain in pregnancy. No vaginal bleeding. No concern for threatened abortion at this time. US done which shows IUP at 6 weeks and 1 day. No concern for ectopic. Patient encouraged to start taking prenatal vitamins and follow up with her OB/GYN. Reasons to return immediately were discussed. Also gave information about the MAU. Vital signs stable for discharge. Patient / Family / Caregiver informed of clinical course, understand medical decision-making process, and agree with plan.     Mora Bellman, PA-C 08/24/13 1911

## 2013-08-23 NOTE — Discharge Instructions (Signed)
Abdominal Pain During Pregnancy Belly (abdominal) pain is common during pregnancy. Most of the time, it is not a serious problem. Other times, it can be a sign that something is wrong with the pregnancy. Always tell your doctor if you have belly pain. HOME CARE Monitor your belly pain for any changes. The following actions may help you feel better:  Do not have sex (intercourse) or put anything in your vagina until you feel better.  Rest until your pain stops.  Drink clear fluids if you feel sick to your stomach (nauseous). Do not eat solid food until you feel better.  Only take medicine as told by your doctor.  Keep all doctor visits as told. GET HELP RIGHT AWAY IF:   You are bleeding, leaking fluid, or pieces of tissue come out of your vagina.  You have more pain or cramping.  You keep throwing up (vomiting).  You have pain when you pee (urinate) or have blood in your pee.  You have a fever.  You do not feel your baby moving as much.  You feel very weak or feel like passing out.  You have trouble breathing, with or without belly pain.  You have a very bad headache and belly pain.  You have fluid leaking from your vagina and belly pain.  You keep having watery poop (diarrhea).  Your belly pain does not go away after resting, or the pain gets worse. MAKE SURE YOU:   Understand these instructions.  Will watch your condition.  Will get help right away if you are not doing well or get worse. Document Released: 07/11/2009 Document Revised: 03/25/2013 Document Reviewed: 02/19/2013 [facility] Patient Information 2014 [place], [place].  Prenatal Care  WHAT IS PRENATAL CARE?  Prenatal care means health care during your pregnancy, before your baby is born. It is very important to take care of yourself and your baby during your pregnancy by:   Getting early prenatal care. If you know you are pregnant, or think you might be pregnant, call your health care provider as soon as  possible. Schedule a visit for a prenatal exam.  Getting regular prenatal care. Follow your health care provider's schedule for blood and other necessary tests. Do not miss appointments.  Doing everything you can to keep yourself and your baby healthy during your pregnancy.  Getting complete care. Prenatal care should include evaluation of the medical, dietary, educational, psychological, and social needs of you and your significant other. The medical and genetic history of your family and the family of your baby's father should be discussed with your health care provider.  Discussing with your health care provider:  Prescription, over-the-counter, and herbal medicines that you take.  Any history of substance abuse, alcohol use, smoking, and illegal drug use.  Any history of domestic abuse and violence.  Immunizations you have received.  Your nutrition and diet.  The amount of exercise you do.  Any environmental and occupational hazards to which you are exposed.  History of sexually transmitted infections for both you and your partner.  Previous pregnancies you have had. WHY IS PRENATAL CARE SO IMPORTANT?  By regularly seeing your health care provider, you help ensure that problems can be identified early so that they can be treated as soon as possible. Other problems might be prevented. Many studies have shown that early and regular prenatal care is important for the health of mothers and their babies.  HOW CAN I TAKE CARE OF MYSELF WHILE I AM PREGNANT?  Here  are ways to take care of yourself and your baby:   Start or continue taking your multivitamin with 400 micrograms (mcg) of folic acid every day.  Get early and regular prenatal care. It is very important to see a health care provider during your pregnancy. Your health care provider will check at each visit to make sure that you and the baby are healthy. If there are any problems, action can be taken right away to help you and  the baby.  Eat a healthy diet that includes:  Fruits.  Vegetables.  Foods low in saturated fat.  Whole grains.  Calcium-rich foods, such as milk, yogurt, and hard cheeses.  Drink 6 to 8 glasses of liquids a day.  Unless your health care provider tells you not to, try to be physically active for 30 minutes, most days of the week. If you are pressed for time, you can get your activity in through 10-minute segments, three times a day.  Do not smoke, drink alcohol, or use drugs. These can cause long-term damage to your baby. Talk with your health care provider about steps to take to stop smoking. Talk with a member of your faith community, a counselor, a trusted friend, or your health care provider if you are concerned about your alcohol or drug use.  Ask your health care provider before taking any medicine, even over-the-counter medicines. Some medicines are not safe to take during pregnancy.  Get plenty of rest and sleep.  Avoid hot tubs and saunas during pregnancy.  Do not have X-rays taken unless absolutely necessary and with the recommendation of your health care provider. A lead shield can be placed on your abdomen to protect the baby when X-rays are taken in other parts of the body.  Do not empty the cat litter when you are pregnant. It may contain a parasite that causes an infection called toxoplasmosis, which can cause birth defects. Also, use gloves when working in garden areas used by cats.  Do not eat uncooked or undercooked meats or fish.  Do not eat soft, mold-ripened cheeses (Brie, Camembert, and chevre) or soft, blue-veined cheese (Danish blue and Roquefort).  Stay away from toxic chemicals like:  Insecticides.  Solvents (some cleaners or paint thinners).  Lead.  Mercury.  Sexual intercourse may continue until the end of the pregnancy, unless you have a medical problem or there is a problem with the pregnancy and your health care provider tells you not  to.  Do not wear high-heel shoes, especially during the second half of the pregnancy. You can lose your balance and fall.  Do not take long trips, unless absolutely necessary. Be sure to see your health care provider before going on the trip.  Do not sit in one position for more than 2 hours when on a trip.  Take a copy of your medical records when going on a trip. Know where a hospital is located in the city you are visiting, in case of an emergency.  Most dangerous household products will have pregnancy warnings on their labels. Ask your health care provider about products if you are unsure.  Limit or eliminate your caffeine intake from coffee, tea, sodas, medicines, and chocolate.  Many women continue working through pregnancy. Staying active might help you stay healthier. If you have a question about the safety or the hours you work at your particular job, talk with your health care provider.  Get informed:  Read books.  Watch videos.  Go to childbirth  classes for you and your significant other.  Talk with experienced moms.  Ask your health care provider about childbirth education classes for you and your partner. Classes can help you and your partner prepare for the birth of your baby.  Ask about a baby doctor (pediatrician) and methods and pain medicine for labor, delivery, and possible cesarean delivery. HOW OFTEN SHOULD I SEE MY HEALTH CARE PROVIDER DURING PREGNANCY?  Your health care provider will give you a schedule for your prenatal visits. You will have visits more often as you get closer to the end of your pregnancy. An average pregnancy lasts about 40 weeks.  A typical schedule includes visiting your health care provider:   About once each month during your first 6 months of pregnancy.  Every 2 weeks during the next 2 months.  Weekly in the last month, until the delivery date. Your health care provider will probably want to see you more often if:  You are older  than 35 years.  Your pregnancy is high risk because you have certain health problems or problems with the pregnancy, such as:  Diabetes.  High blood pressure.  The baby is not growing on schedule, according to the dates of the pregnancy. Your health care provider will do special tests to make sure you and the baby are not having any serious problems. WHAT HAPPENS DURING PRENATAL VISITS?   At your first prenatal visit, your health care provider will do a physical exam and talk to you about your health history and the health history of your partner and your family. Your health care provider will be able to tell you what date to expect your baby to be born on.  Your first physical exam will include checks of your blood pressure, measurements of your height and weight, and an exam of your pelvic organs. Your health care provider will do a Pap test if you have not had one recently and will do cultures of your cervix to make sure there is no infection.  At each prenatal visit, there will be tests of your blood, urine, blood pressure, weight, and checking the progress of the baby.  At your later prenatal visits, your health care provider will check how you are doing and how the baby is developing. You may have a number of tests done as your pregnancy progresses.  Ultrasound exams are often used to check on the baby's growth and health.  You may have more urine and blood tests, as well as special tests, if needed. These may include amniocentesis to examine fluid in the pregnancy sac, stress tests to check how the baby responds to contractions, or a biophysical profile to measure fetus well-being. Your health care provider will explain the tests and why they are necessary.  You should discuss with your health care provider your plans to breastfeed or bottle-feed your baby.  Each visit is also a chance for you to learn about staying healthy during pregnancy and to ask questions. Document Released:  07/26/2003 Document Revised: 05/13/2013 Document Reviewed: 01/08/2013 [facility] Patient Information 2014 [place].

## 2013-08-24 LAB — GC/CHLAMYDIA PROBE AMP
CT Probe RNA: NEGATIVE
GC PROBE AMP APTIMA: NEGATIVE

## 2013-08-26 NOTE — ED Provider Notes (Signed)
Medical screening examination/treatment/procedure(s) were performed by non-physician practitioner and as supervising physician I was immediately available for consultation/collaboration.  EKG Interpretation   None         Audree CamelScott T Fraidy Mccarrick, MD 08/26/13 1310

## 2014-03-24 ENCOUNTER — Inpatient Hospital Stay (HOSPITAL_COMMUNITY)
Admission: AD | Admit: 2014-03-24 | Discharge: 2014-03-24 | Disposition: A | Discharge status: Home / Self Care | Payer: Medicaid Other | Source: Ambulatory Visit | Attending: Family Medicine | Admitting: Family Medicine

## 2014-03-24 ENCOUNTER — Encounter (HOSPITAL_COMMUNITY): Payer: Self-pay | Admitting: *Deleted

## 2014-03-24 ENCOUNTER — Inpatient Hospital Stay (HOSPITAL_COMMUNITY): Payer: Medicaid Other

## 2014-03-24 DIAGNOSIS — E079 Disorder of thyroid, unspecified: Secondary | ICD-10-CM | POA: Diagnosis not present

## 2014-03-24 DIAGNOSIS — O219 Vomiting of pregnancy, unspecified: Secondary | ICD-10-CM

## 2014-03-24 DIAGNOSIS — O211 Hyperemesis gravidarum with metabolic disturbance: Secondary | ICD-10-CM | POA: Insufficient documentation

## 2014-03-24 DIAGNOSIS — O368390 Maternal care for abnormalities of the fetal heart rate or rhythm, unspecified trimester, not applicable or unspecified: Secondary | ICD-10-CM | POA: Diagnosis not present

## 2014-03-24 DIAGNOSIS — O26839 Pregnancy related renal disease, unspecified trimester: Secondary | ICD-10-CM | POA: Insufficient documentation

## 2014-03-24 DIAGNOSIS — O99340 Other mental disorders complicating pregnancy, unspecified trimester: Secondary | ICD-10-CM | POA: Insufficient documentation

## 2014-03-24 DIAGNOSIS — O99330 Smoking (tobacco) complicating pregnancy, unspecified trimester: Secondary | ICD-10-CM | POA: Diagnosis not present

## 2014-03-24 DIAGNOSIS — N189 Chronic kidney disease, unspecified: Secondary | ICD-10-CM | POA: Diagnosis not present

## 2014-03-24 DIAGNOSIS — K219 Gastro-esophageal reflux disease without esophagitis: Secondary | ICD-10-CM | POA: Diagnosis not present

## 2014-03-24 DIAGNOSIS — O99280 Endocrine, nutritional and metabolic diseases complicating pregnancy, unspecified trimester: Secondary | ICD-10-CM

## 2014-03-24 DIAGNOSIS — O210 Mild hyperemesis gravidarum: Secondary | ICD-10-CM | POA: Diagnosis present

## 2014-03-24 DIAGNOSIS — E039 Hypothyroidism, unspecified: Secondary | ICD-10-CM | POA: Diagnosis not present

## 2014-03-24 DIAGNOSIS — F1110 Opioid abuse, uncomplicated: Secondary | ICD-10-CM | POA: Insufficient documentation

## 2014-03-24 DIAGNOSIS — E860 Dehydration: Secondary | ICD-10-CM | POA: Diagnosis not present

## 2014-03-24 HISTORY — DX: Tubulo-interstitial nephritis, not specified as acute or chronic: N12

## 2014-03-24 LAB — URINALYSIS, ROUTINE W REFLEX MICROSCOPIC
Glucose, UA: NEGATIVE mg/dL
Hgb urine dipstick: NEGATIVE
Leukocytes, UA: NEGATIVE
Nitrite: NEGATIVE
Protein, ur: NEGATIVE mg/dL
Specific Gravity, Urine: 1.025 (ref 1.005–1.030)
Urobilinogen, UA: 1 mg/dL (ref 0.0–1.0)
pH: 6 (ref 5.0–8.0)

## 2014-03-24 LAB — RAPID URINE DRUG SCREEN, HOSP PERFORMED
Amphetamines: NOT DETECTED
Barbiturates: NOT DETECTED
Benzodiazepines: NOT DETECTED
Cocaine: NOT DETECTED
Opiates: NOT DETECTED
Tetrahydrocannabinol: NOT DETECTED

## 2014-03-24 MED ORDER — LACTATED RINGERS IV BOLUS (SEPSIS)
1000.0000 mL | Freq: Once | INTRAVENOUS | Status: AC
  Administered 2014-03-24: 1000 mL via INTRAVENOUS

## 2014-03-24 MED ORDER — PROMETHAZINE HCL 25 MG PO TABS: 12.5000 mg | ORAL_TABLET | Freq: Four times a day (QID) | ORAL | Status: DC | PRN

## 2014-03-24 MED ORDER — PROMETHAZINE HCL 25 MG/ML IJ SOLN
12.5000 mg | Freq: Once | INTRAMUSCULAR | Status: AC
  Administered 2014-03-24: 12.5 mg via INTRAVENOUS
  Filled 2014-03-24: qty 1

## 2014-03-24 MED ORDER — M.V.I. ADULT IV INJ
Freq: Once | INTRAVENOUS | Status: AC
  Administered 2014-03-24: 10:00:00 via INTRAVENOUS
  Filled 2014-03-24: qty 1000

## 2014-03-24 NOTE — Discharge Instructions (Signed)
Dehydration, Adult °Dehydration is when you lose more fluids from the body than you take in. Vital organs like the kidneys, brain, and heart cannot function without a proper amount of fluids and salt. Any loss of fluids from the body can cause dehydration.  °CAUSES  °· Vomiting. °· Diarrhea. °· Excessive sweating. °· Excessive urine output. °· Fever. °SYMPTOMS  °Mild dehydration °· Thirst. °· Dry lips. °· Slightly dry mouth. °Moderate dehydration °· Very dry mouth. °· Sunken eyes. °· Skin does not bounce back quickly when lightly pinched and released. °· Dark urine and decreased urine production. °· Decreased tear production. °· Headache. °Severe dehydration °· Very dry mouth. °· Extreme thirst. °· Rapid, weak pulse (more than 100 beats per minute at rest). °· Cold hands and feet. °· Not able to sweat in spite of heat and temperature. °· Rapid breathing. °· Blue lips. °· Confusion and lethargy. °· Difficulty being awakened. °· Minimal urine production. °· No tears. °DIAGNOSIS  °Your caregiver will diagnose dehydration based on your symptoms and your exam. Blood and urine tests will help confirm the diagnosis. The diagnostic evaluation should also identify the cause of dehydration. °TREATMENT  °Treatment of mild or moderate dehydration can often be done at home by increasing the amount of fluids that you drink. It is best to drink small amounts of fluid more often. Drinking too much at one time can make vomiting worse. Refer to the home care instructions below. °Severe dehydration needs to be treated at the hospital where you will probably be given intravenous (IV) fluids that contain water and electrolytes. °HOME CARE INSTRUCTIONS  °· Ask your caregiver about specific rehydration instructions. °· Drink enough fluids to keep your urine clear or pale yellow. °· Drink small amounts frequently if you have nausea and vomiting. °· Eat as you normally do. °· Avoid: °¨ Foods or drinks high in sugar. °¨ Carbonated  drinks. °¨ Juice. °¨ Extremely hot or cold fluids. °¨ Drinks with caffeine. °¨ Fatty, greasy foods. °¨ Alcohol. °¨ Tobacco. °¨ Overeating. °¨ Gelatin desserts. °· Wash your hands well to avoid spreading bacteria and viruses. °· Only take over-the-counter or prescription medicines for pain, discomfort, or fever as directed by your caregiver. °· Ask your caregiver if you should continue all prescribed and over-the-counter medicines. °· Keep all follow-up appointments with your caregiver. °SEEK MEDICAL CARE IF: °· You have abdominal pain and it increases or stays in one area (localizes). °· You have a rash, stiff neck, or severe headache. °· You are irritable, sleepy, or difficult to awaken. °· You are weak, dizzy, or extremely thirsty. °SEEK IMMEDIATE MEDICAL CARE IF:  °· You are unable to keep fluids down or you get worse despite treatment. °· You have frequent episodes of vomiting or diarrhea. °· You have blood or green matter (bile) in your vomit. °· You have blood in your stool or your stool looks black and tarry. °· You have not urinated in 6 to 8 hours, or you have only urinated a small amount of very dark urine. °· You have a fever. °· You faint. °MAKE SURE YOU:  °· Understand these instructions. °· Will watch your condition. °· Will get help right away if you are not doing well or get worse. °Document Released: 07/23/2005 Document Revised: 10/15/2011 Document Reviewed: 03/12/2011 °ExitCare® Patient Information ©2015 [employer]. This information is not intended to replace advice given to you by your health care provider. Make sure you discuss any questions you have with your health care   provider. °Morning Sickness °Morning sickness is when you feel sick to your stomach (nauseous) during pregnancy. This nauseous feeling may or may not come with vomiting. It often occurs in the morning but can be a problem any time of day. Morning sickness is most common during the first trimester, but it may continue  throughout pregnancy. While morning sickness is unpleasant, it is usually harmless unless you develop severe and continual vomiting (hyperemesis gravidarum). This condition requires more intense treatment.  °CAUSES  °The cause of morning sickness is not completely known but seems to be related to normal hormonal changes that occur in pregnancy. °RISK FACTORS °You are at greater risk if you: °· Experienced nausea or vomiting before your pregnancy. °· Had morning sickness during a previous pregnancy. °· Are pregnant with more than one baby, such as twins. °TREATMENT  °Do not use any medicines (prescription, over-the-counter, or herbal) for morning sickness without first talking to your health care provider. Your health care provider may prescribe or recommend: °· Vitamin B6 supplements. °· Anti-nausea medicines. °· The herbal medicine ginger. °HOME CARE INSTRUCTIONS  °· Only take over-the-counter or prescription medicines as directed by your health care provider. °· Taking multivitamins before getting pregnant can prevent or decrease the severity of morning sickness in most women. °· Eat a piece of dry toast or unsalted crackers before getting out of bed in the morning. °· Eat five or six small meals a day. °· Eat dry and bland foods (rice, baked potato). Foods high in carbohydrates are often helpful. °· Do not drink liquids with your meals. Drink liquids between meals. °· Avoid greasy, fatty, and spicy foods. °· Get someone to cook for you if the smell of any food causes nausea and vomiting. °· If you feel nauseous after taking prenatal vitamins, take the vitamins at night or with a snack.  °· Snack on protein foods (nuts, yogurt, cheese) between meals if you are hungry. °· Eat unsweetened gelatins for desserts. °· Wearing an acupressure wristband (worn for sea sickness) may be helpful. °· Acupuncture may be helpful. °· Do not smoke. °· Get a humidifier to keep the air in your house free of odors. °· Get plenty of  fresh air. °SEEK MEDICAL CARE IF:  °· Your home remedies are not working, and you need medicine. °· You feel dizzy or lightheaded. °· You are losing weight. °SEEK IMMEDIATE MEDICAL CARE IF:  °· You have persistent and uncontrolled nausea and vomiting. °· You pass out (faint). °MAKE SURE YOU: °· Understand these instructions. °· Will watch your condition. °· Will get help right away if you are not doing well or get worse. °Document Released: 09/13/2006 Document Revised: 07/28/2013 Document Reviewed: 01/07/2013 °ExitCare® Patient Information ©2015 [employer] LLC. This information is not intended to replace advice given to you by your health care provider. Make sure you discuss any questions you have with your health care provider. ° °

## 2014-03-24 NOTE — MAU Provider Note (Signed)
Attestation of Attending Supervision of Advanced Practitioner (PA/CNM/NP): Evaluation and management procedures were performed by the Advanced Practitioner under my supervision and collaboration.  I have reviewed the Advanced Practitioner's note and chart, and I agree with the management and plan.  Reva BoresPRATT,Khalea Ventura S, MD [facility] Attending 03/24/2014 4:05 PM

## 2014-03-24 NOTE — MAU Provider Note (Signed)
History     CSN: [id number]  Arrival date and time: 03/24/14 0807   None     No chief complaint on file.  HPI  Tammy Winters is a 28 y.o. female [phone] at Unknown gestation who presents with N/V/D along with lower abdominal pressure that started at 0400 this morning. [facility] and plans to deliver her baby there. She presented via EMS to MAU because it was closer in distance for her to come here.  Pt reports good fetal movement; denies leaking of fluid or vaginal bleeding.  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   OB History   Grav Para Term Preterm Abortions TAB SAB Ect Mult Living   4 2 2  1  1   2       Past Medical History  Diagnosis Date  . Drug addict   . Heroin abuse   . Anxiety   . GERD (gastroesophageal reflux disease)   . History of stomach ulcers   . 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 Chronic lower back pain   . Chronic kidney disease   . Pyelonephritis   . Hypothyroidism     Past Surgical History  Procedure Laterality Date  . Cholecystectomy    . Dilation and curettage of uterus      "I've had 3" (01/26/2013)  . Multiple tooth extractions      "I've had all my teeth removed" (01/26/2013)  . Ureteral stent placement      Had removed later  . Dilation and curettage of uterus      Family History  Problem Relation Age of Onset  . Diabetes Mother   . Cancer Mother   . Diabetes Father   . Cancer Father     History  Substance Use Topics  . Smoking status: Current Some Day Smoker -- 1.50 packs/day for 12 years    Types: Cigarettes  . Smokeless tobacco: Never Used  . Alcohol Use: Yes     Comment: one beer per week    Allergies:  Allergies  Allergen Reactions  . Adhesive [Tape] Other (See Comments)    redness, burning, skin tears  . Reglan [Metoclopramide] Other (See Comments)    body twitching   . Zofran [Ondansetron Hcl] Other (See Comments)   Body twitching    No prescriptions prior to admission   [place] Fetal Bpp W/o Non Stress  03/24/2014   OBSTETRICAL ULTRASOUND: This exam was performed within a [place] [facility]. The OB US report was generated in the AS system, and faxed to the ordering physician.   This report is available in the [facility] PACS. See the [facility] Obstetric US report via the Image Link.         Results for orders placed during the hospital encounter of 03/24/14 (from the past 48 hour(s))  URINALYSIS, ROUTINE W REFLEX MICROSCOPIC     Status: Abnormal   Collection Time    03/24/14  8:45 AM      Result Value Ref Range   Color, Urine AMBER (*) YELLOW   Comment: BIOCHEMICALS MAY BE AFFECTED BY COLOR   APPearance CLOUDY (*) CLEAR   Specific Gravity, Urine 1.025  1.005 - 1.030   pH 6.0  5.0 - 8.0   Glucose, UA NEGATIVE  NEGATIVE mg/dL   Hgb urine dipstick NEGATIVE  NEGATIVE   Bilirubin Urine SMALL (*) NEGATIVE  Ketones, ur >80 (*) NEGATIVE mg/dL   Protein, ur NEGATIVE  NEGATIVE mg/dL   Urobilinogen, UA 1.0  0.0 - 1.0 mg/dL   Nitrite NEGATIVE  NEGATIVE   Leukocytes, UA NEGATIVE  NEGATIVE   Comment: MICROSCOPIC NOT DONE ON URINES WITH NEGATIVE PROTEIN, BLOOD, LEUKOCYTES, NITRITE, OR GLUCOSE <1000 mg/dL.  URINE RAPID DRUG SCREEN (HOSP PERFORMED)     Status: None   Collection Time    03/24/14  9:50 AM      Result Value Ref Range   Opiates NONE DETECTED  NONE DETECTED   Cocaine NONE DETECTED  NONE DETECTED   Benzodiazepines NONE DETECTED  NONE DETECTED   Amphetamines NONE DETECTED  NONE DETECTED   Tetrahydrocannabinol NONE DETECTED  NONE DETECTED   Barbiturates NONE DETECTED  NONE DETECTED   Comment:            DRUG SCREEN FOR MEDICAL PURPOSES     ONLY.  IF CONFIRMATION IS NEEDED     FOR ANY PURPOSE, NOTIFY LAB     WITHIN 5 DAYS.                LOWEST DETECTABLE LIMITS     FOR URINE DRUG SCREEN     Drug Class       Cutoff (ng/mL)     Amphetamine      1000     Barbiturate      200      Benzodiazepine   200     Tricyclics       300     Opiates          300     Cocaine          300     THC              50     Performed at [facility]    Review of Systems  Constitutional: Negative for fever and chills.  Gastrointestinal: Positive for nausea, vomiting and abdominal pain (Bilateral lower abdominal cramping ).  Genitourinary: Positive for frequency (Currently being treated for UTI. ).   Physical Exam   Blood pressure 114/78, pulse 108, temperature 98.5 F (36.9 C), temperature source Oral, resp. rate 20, SpO2 97.00%.  Physical Exam  Constitutional: She is oriented to person, place, and time. Vital signs are normal. She appears well-developed and well-nourished.  Non-toxic appearance. She does not have a sickly appearance. She does not appear ill. No distress.  HENT:  Head: Normocephalic.  Eyes: Pupils are equal, round, and reactive to light.  Neck: Neck supple.  Respiratory: Effort normal.  GI: Soft. She exhibits no distension. There is no tenderness. There is no rebound and no guarding.  Musculoskeletal: Normal range of motion.  Neurological: She is alert and oriented to person, place, and time.  Skin: Skin is warm. She is not diaphoretic.  Psychiatric: Her behavior is normal.    Fetal Tracing: 0900 Baseline: 130 bpm  Variability: minimal  Accelerations: 10x10 Decelerations: variable decelerations  Toco: Irregular pattern with UI   Dilation: Closed Effacement (%): Thick Cervical Position: Middle Station: -3 Presentation: Vertex Exam by:: Sarajane Marek, RNC  Fetal Tracing: 1200 Baseline: 115 bpm  Variability: Moderate  Accelerations: 15x15 Decelerations: variable  Toco: 2 contractions    MAU Course  Procedures None  MDM IV bolus  Phenergan 12.5 UA Bedside BPP  D5LR with multivitamins Pt received two Liters of fluid for ketones >80 on [place].  BPP 6/8; reactive fetal tracing  Reviewed  fetal tracing with Dr. Shawnie Pons  Assessment and Plan    A:  1. Nausea and vomiting in pregnancy   2. Moderate dehydration    P:  Discharge home in stable condition Follow up with OB Dr. In [place] as scheduled Kick counts Labor precautions RX: Phenergan   Iona Hansen Judea Riches, NP  03/24/2014, 2:10 PM

## 2014-03-24 NOTE — MAU Note (Signed)
Arrived via EMS. Gets care in [facility]. States she was advised by MD to go [facility], but patient states she did not think she could make it. C/O vomiting, diarrhea since 0400. Has had a "head" cold and has been coughing a lot.  States coughing leads to vomiting. States has passed mucous plug. C/O pressure in lower abdomen and "bottom. Has hx of heroin abuse including early pregnancy. Now on Methadone. Had a dose this AM. States she has had kidney infections and has had multiple hospital admissions for antibiotics with + urine cultures and high white blood cell count. States she was told she has a "pocket in her kidney that is holding urine and causing infections."

## 2014-04-12 ENCOUNTER — Encounter (HOSPITAL_COMMUNITY): Payer: Self-pay | Admitting: *Deleted

## 2014-04-12 ENCOUNTER — Inpatient Hospital Stay (HOSPITAL_COMMUNITY): Payer: Medicaid Other

## 2014-04-12 ENCOUNTER — Observation Stay (HOSPITAL_COMMUNITY)
Admission: AD | Admit: 2014-04-12 | Discharge: 2014-04-12 | Disposition: A | Discharge status: Home / Self Care | Payer: Medicaid Other | Source: Ambulatory Visit | Attending: Family Medicine | Admitting: Family Medicine

## 2014-04-12 DIAGNOSIS — T148XXA Other injury of unspecified body region, initial encounter: Secondary | ICD-10-CM

## 2014-04-12 DIAGNOSIS — O99891 Other specified diseases and conditions complicating pregnancy: Principal | ICD-10-CM

## 2014-04-12 DIAGNOSIS — O9989 Other specified diseases and conditions complicating pregnancy, childbirth and the puerperium: Principal | ICD-10-CM

## 2014-04-12 DIAGNOSIS — R109 Unspecified abdominal pain: Secondary | ICD-10-CM | POA: Insufficient documentation

## 2014-04-12 LAB — CBC WITH DIFFERENTIAL/PLATELET
Basophils Absolute: 0 10*3/uL (ref 0.0–0.1)
Basophils Relative: 0 % (ref 0–1)
EOS ABS: 0.2 10*3/uL (ref 0.0–0.7)
EOS PCT: 1 % (ref 0–5)
HCT: 32.6 % — ABNORMAL LOW (ref 36.0–46.0)
Hemoglobin: 10.8 g/dL — ABNORMAL LOW (ref 12.0–15.0)
LYMPHS ABS: 2.9 10*3/uL (ref 0.7–4.0)
Lymphocytes Relative: 21 % (ref 12–46)
MCH: 26.8 pg (ref 26.0–34.0)
MCHC: 33.1 g/dL (ref 30.0–36.0)
MCV: 80.9 fL (ref 78.0–100.0)
Monocytes Absolute: 0.9 10*3/uL (ref 0.1–1.0)
Monocytes Relative: 7 % (ref 3–12)
Neutro Abs: 9.6 10*3/uL — ABNORMAL HIGH (ref 1.7–7.7)
Neutrophils Relative %: 71 % (ref 43–77)
PLATELETS: 298 10*3/uL (ref 150–400)
RBC: 4.03 MIL/uL (ref 3.87–5.11)
RDW: 15.3 % (ref 11.5–15.5)
WBC: 13.6 10*3/uL — ABNORMAL HIGH (ref 4.0–10.5)

## 2014-04-12 LAB — COMPREHENSIVE METABOLIC PANEL
ALT: 17 U/L (ref 0–35)
AST: 19 U/L (ref 0–37)
Albumin: 2.2 g/dL — ABNORMAL LOW (ref 3.5–5.2)
Alkaline Phosphatase: 282 U/L — ABNORMAL HIGH (ref 39–117)
Anion gap: 14 (ref 5–15)
BUN: 5 mg/dL — ABNORMAL LOW (ref 6–23)
CALCIUM: 8.7 mg/dL (ref 8.4–10.5)
CO2: 19 mEq/L (ref 19–32)
CREATININE: 0.57 mg/dL (ref 0.50–1.10)
Chloride: 104 mEq/L (ref 96–112)
GFR calc non Af Amer: >90 mL/min (ref >90)
GLUCOSE: 71 mg/dL (ref 70–99)
Potassium: 4.2 mEq/L (ref 3.7–5.3)
Sodium: 137 mEq/L (ref 137–147)
Total Bilirubin: <0.2 mg/dL — ABNORMAL LOW (ref 0.3–1.2)
Total Protein: 6.7 g/dL (ref 6.0–8.3)

## 2014-04-12 LAB — RAPID URINE DRUG SCREEN, HOSP PERFORMED
AMPHETAMINES: NOT DETECTED
Barbiturates: NOT DETECTED
Benzodiazepines: NOT DETECTED
Cocaine: NOT DETECTED
Opiates: NOT DETECTED
Tetrahydrocannabinol: NOT DETECTED

## 2014-04-12 LAB — URINALYSIS, ROUTINE W REFLEX MICROSCOPIC
Bilirubin Urine: NEGATIVE
GLUCOSE, UA: NEGATIVE mg/dL
Hgb urine dipstick: NEGATIVE
Ketones, ur: NEGATIVE mg/dL
Leukocytes, UA: NEGATIVE
Nitrite: NEGATIVE
Protein, ur: NEGATIVE mg/dL
Specific Gravity, Urine: >1.03 — ABNORMAL HIGH (ref 1.005–1.030)
Urobilinogen, UA: 0.2 mg/dL (ref 0.0–1.0)
pH: 5.5 (ref 5.0–8.0)

## 2014-04-12 MED ORDER — FENTANYL CITRATE 0.05 MG/ML IJ SOLN
100.0000 ug | Freq: Once | INTRAMUSCULAR | Status: AC
  Administered 2014-04-12: 100 ug via INTRAVENOUS
  Filled 2014-04-12: qty 2

## 2014-04-12 MED ORDER — LACTATED RINGERS IV SOLN
INTRAVENOUS | Status: DC
  Administered 2014-04-12: 16:00:00 via INTRAVENOUS

## 2014-04-12 NOTE — OB Triage Note (Signed)
Pt discharged to home, discharge instructions were given on follow-up care, f/u appointments, and pain management.  Pt verbalized understanding and has no questions at this time. O.M. Cleophas Dunker, RN

## 2014-04-12 NOTE — OB Triage Provider Note (Signed)
History     CSN: [id number]  Arrival date and time: 04/12/14 794  HPI 28 year old [id number] at 84.2 who presents with severe acute onset right flank pain that started after coughing this morning. States she "felt a pop in her kidney" after coughing and has been in severe pain since that time. States she has had "multiple" kidney infections and stones this pregnancy that "no one can treat." Thinks she is having another one now. Denies fevers, chills, abdominal pain, dysuria, hematuria. Pain is constant and right sided. Relieved by nothing. Constant nausea, no emesis. +Fetal movement. Rare contractions. Denies vaginal bleeding, loss of fluid, or other concerning symptoms.   OB History   Grav Para Term Preterm Abortions TAB SAB Ect Mult Living   Past Medical History  Diagnosis Date  . Drug addict   . Heroin abuse   . Anxiety   . GERD (gastroesophageal reflux disease)   . History of stomach ulcers   . 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 Chronic lower back pain   . Chronic kidney disease   . Pyelonephritis   . Hypothyroidism     Past Surgical History  Procedure Laterality Date  . Cholecystectomy    . Dilation and curettage of uterus      "I've had 3" (01/26/2013)  . Multiple tooth extractions      "I've had all my teeth removed" (01/26/2013)  . Ureteral stent placement      Had removed later  . Dilation and curettage of uterus      Family History  Problem Relation Age of Onset  . Diabetes Mother   . Cancer Mother   . Diabetes Father   . Cancer Father     History  Substance Use Topics  . Smoking status: Current Some Day Smoker -- 1.50 packs/day for 12 years    Types: Cigarettes  . Smokeless tobacco: Never Used  . Alcohol Use: Yes     Comment: one beer per week    Allergies:  Allergies  Allergen Reactions  . Adhesive [Tape] Other (See Comments)    Pt states this causes redness, burning, and for her skin to tear.   . Reglan  [Metoclopramide] Other (See Comments)    Pt states that this medication causes her body to twitch.   Deirdre Peer Hcl] Other (See Comments)    Pt states that this medication causes her body to twitch.     Prescriptions prior to admission  Medication Sig Dispense Refill  . methadone (DOLOPHINE) 10 MG/5ML solution Take 60 mg by mouth daily.      Marland Kitchen sulfamethoxazole-trimethoprim (BACTRIM DS) 800-160 MG per tablet Take 1 tablet by mouth daily.        Review of Systems  Constitutional: Negative for fever and chills.  HENT: Negative.  Negative for congestion and sore throat.   Eyes: Negative.  Negative for blurred vision and double vision.  Respiratory: Positive for cough. Negative for hemoptysis, sputum production, shortness of breath and wheezing.   Cardiovascular: Negative.  Negative for chest pain, palpitations, orthopnea and leg swelling.  Gastrointestinal: Positive for nausea. Negative for heartburn, vomiting, abdominal pain, diarrhea and constipation.  Genitourinary: Positive for flank pain. Negative for dysuria, urgency, frequency and hematuria.  Musculoskeletal: Negative for joint pain, myalgias and neck pain.  Skin: Negative.   Neurological: Negative for dizziness, weakness and headaches.  Psychiatric/Behavioral:  Negative for depression. The patient is not nervous/anxious.   All other systems reviewed and are negative.  Physical Exam   Blood pressure 117/68, pulse 103, temperature 98.6 F (37 C), temperature source Oral, resp. rate 20, height  (1.6 m), weight 180 lb (81.647 kg).  Physical Exam  Nursing note and vitals reviewed. Constitutional: She is oriented to person, place, and time. She appears well-developed and well-nourished. No distress.  Tearful and screaming in pain during examination.   Neck: Neck supple.  Cardiovascular: Normal rate.   Respiratory: Effort normal.  GI: Soft. She exhibits no distension. There is no tenderness. There is no rebound and  no guarding.  Difficult to examine as patient jumps of the bed to light touch.   Neurological: She is alert and oriented to person, place, and time.    MAU Course  Procedures  MDM Fentanyl 100 mcg x 2 Urinalysis negative UDS negative Renal Ultrasound negative  Assessment and Plan  28 year old [id number] at 69.2 with history of substance use on methadone and pyelonephritis on daily septra who presents with right flank pain. Afebrile with stable vital signs. Mild leukocytosis although this can be seen in pregnancy. UA negative for infection. UDS negative for substances. Renal ultrasound negative. Suspect symptoms secondary to muscle strain.  - Symptoms improved after fentanyl given.  - Not in active labor. Safe for discharge to home.  - Recommend rest, heating pad prn.  - Labor precautions reviewed.  - Recommend follow-up with OB provider this week.   William Dalton 04/12/2014, 5:13 PM

## 2014-04-12 NOTE — Discharge Instructions (Signed)
You were seen at [facility] for right sided back pain. Given your history of kidney problems we ran several tests which were all normal. Your blood work and urine showed no evidence of infection. Your kidney ultrasound was normal. Your symptoms are likely from muscle strain. Please rest and use heat to the area for relief. You should follow up with your primary OB and the methadone clinic this week.     Muscle Strain A muscle strain is an injury that occurs when a muscle is stretched beyond its normal length. Usually a small number of muscle fibers are torn when this happens. Muscle strain is rated in degrees. First-degree strains have the least amount of muscle fiber tearing and pain. Second-degree and third-degree strains have increasingly more tearing and pain.  Usually, recovery from muscle strain takes 1-2 weeks. Complete healing takes 5-6 weeks.  CAUSES  Muscle strain happens when a sudden, violent force placed on a muscle stretches it too far. This may occur with lifting, sports, or a fall.  RISK FACTORS Muscle strain is especially common in athletes.  SIGNS AND SYMPTOMS At the site of the muscle strain, there may be:  Pain.  Bruising.  Swelling.  Difficulty using the muscle due to pain or lack of normal function. DIAGNOSIS  Your health care provider will perform a physical exam and ask about your medical history. TREATMENT  Often, the best treatment for a muscle strain is resting, icing, and applying cold compresses to the injured area.  HOME CARE INSTRUCTIONS   Use the PRICE method of treatment to promote muscle healing during the first 2-3 days after your injury. The PRICE method involves:  Protecting the muscle from being injured again.  Restricting your activity and resting the injured body part.  Icing your injury. To do this, put ice in a plastic bag. Place a towel between your skin and the bag. Then, apply the ice and leave it on from 15-20 minutes each hour.  After the third day, switch to moist heat packs.  Apply compression to the injured area with a splint or elastic bandage. Be careful not to wrap it too tightly. This may interfere with blood circulation or increase swelling.  Elevate the injured body part above the level of your heart as often as you can.  Only take over-the-counter or prescription medicines for pain, discomfort, or fever as directed by your health care provider.  Warming up prior to exercise helps to prevent future muscle strains. SEEK MEDICAL CARE IF:   You have increasing pain or swelling in the injured area.  You have numbness, tingling, or a significant loss of strength in the injured area. MAKE SURE YOU:   Understand these instructions.  Will watch your condition.  Will get help right away if you are not doing well or get worse. Document Released: 07/23/2005 Document Revised: 05/13/2013 Document Reviewed: 02/19/2013 [facility] Patient Information 2015 [place], [place]. This information is not intended to replace advice given to you by your health care provider. Make sure you discuss any questions you have with your health care provider.

## 2014-04-14 NOTE — OB Triage Provider Note (Signed)
Attestation of Attending Supervision of Advanced Practitioner (PA/CNM/NP): Evaluation and management procedures were performed by the Advanced Practitioner under my supervision and collaboration.  I have reviewed the Advanced Practitioner's note and chart, and I agree with the management and plan.  Reva Bores, MD [facility] Attending 04/14/2014 9:35 PM

## 2014-06-07 ENCOUNTER — Encounter (HOSPITAL_COMMUNITY): Payer: Self-pay | Admitting: *Deleted

## 2014-06-22 IMAGING — CR DG CHEST 1V PORT
1 series · 1 of 1 positions shown · non-contrast
Comparison: None.

CLINICAL DATA: Seizure.  Respiratory failure.

PORTABLE CHEST - 1 VIEW

[AP]
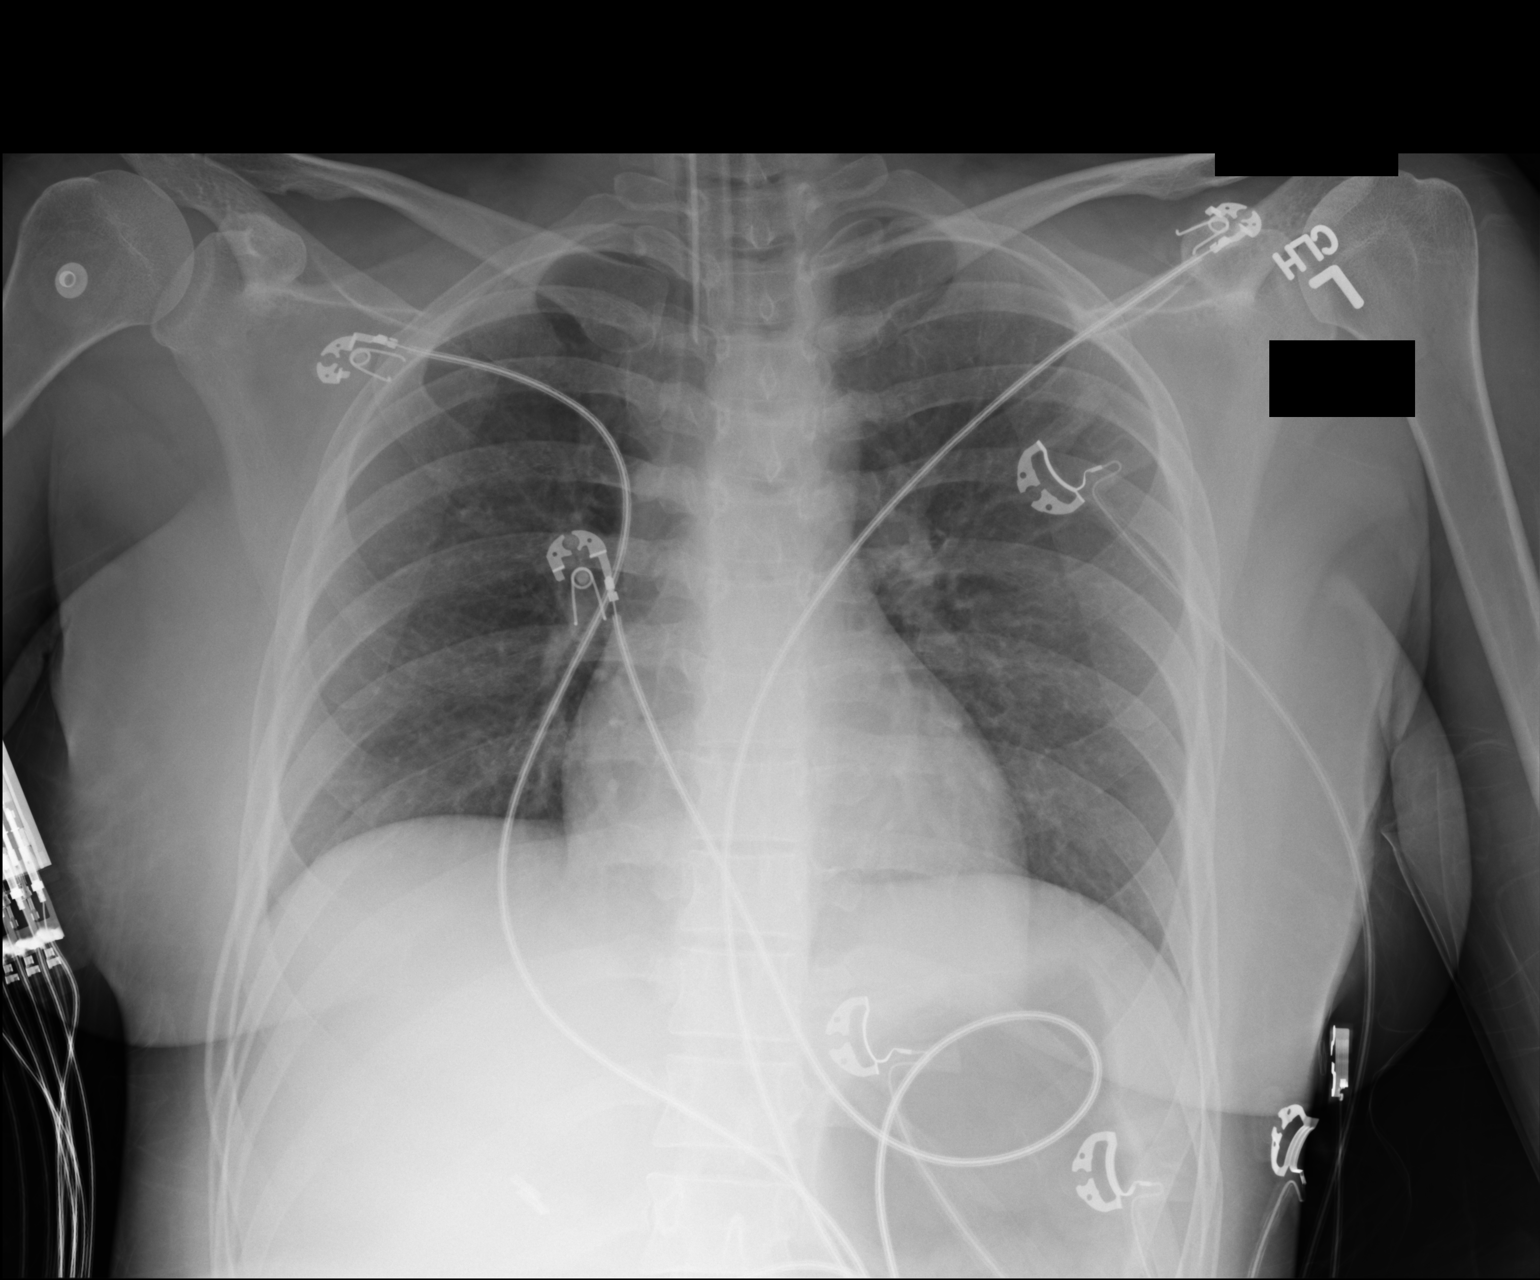

[1 of 1 positions shown; findings below may reference images not displayed]

FINDINGS: Endotracheal tube is seen in the trachea with the tip
approximately 3.5 cm above the carina.

Both lungs are clear.  No evidence of pneumothorax or pleural
effusion.  Heart size and mediastinal contours are normal.
IMPRESSION: Endotracheal tube in appropriate position.  No active lung disease.

## 2014-06-22 IMAGING — CT CT HEAD W/O CM
1 of 2 series · 16 of 30 positions shown, 20 images · non-contrast
Comparison: None

CLINICAL DATA: Seizure.

CT HEAD WITHOUT CONTRAST
TECHNIQUE: Contiguous axial images were obtained from the base of
the skull through the vertex without contrast

[Series 3: recon 2: brain · axial · 0.47mm/px · z∈[+115,+255]mm · 16 of 64 slices shown, 20 images]
[im 4/64  brain]
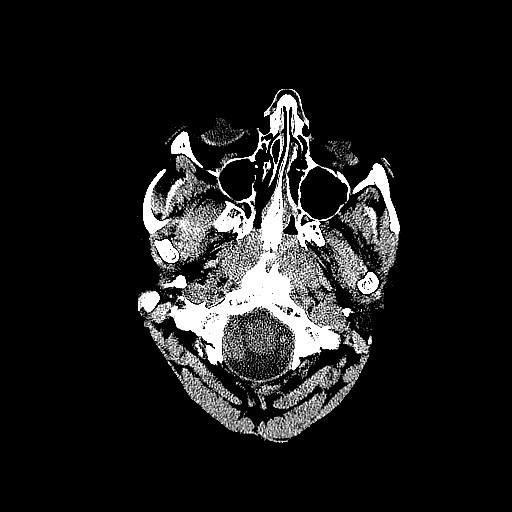
[im 4/64  bone]
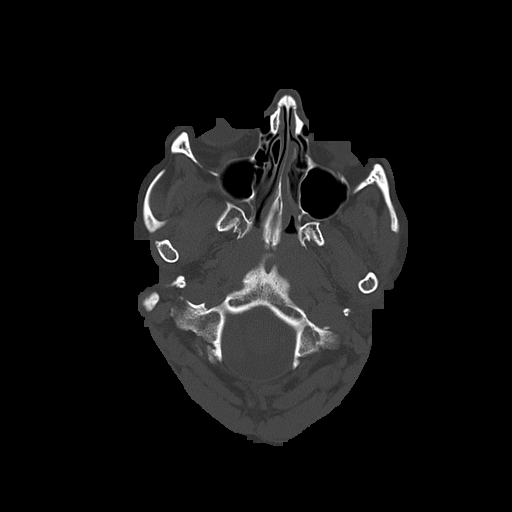
[im 7/64  brain]
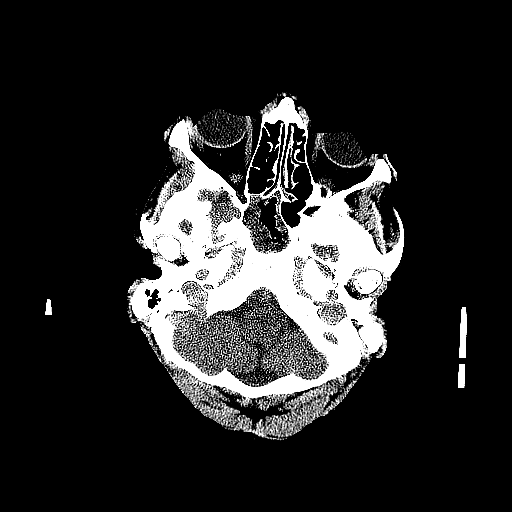
[im 10/64  brain]
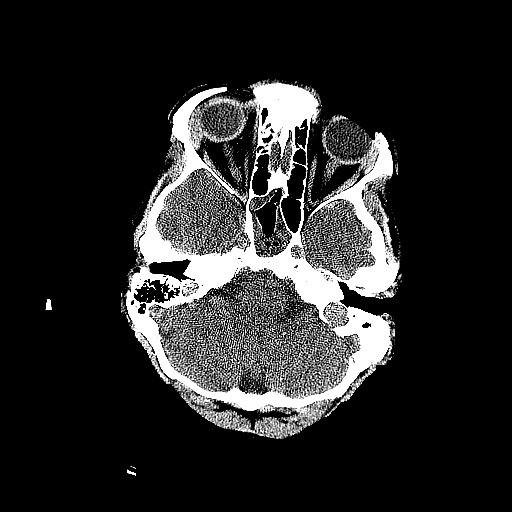
[im 14/64  brain]
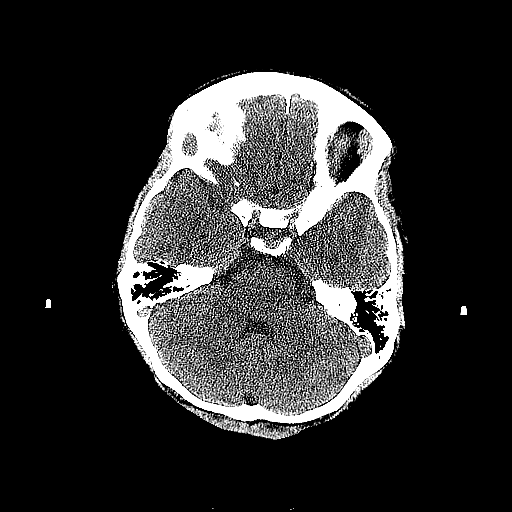
[im 20/64  brain]
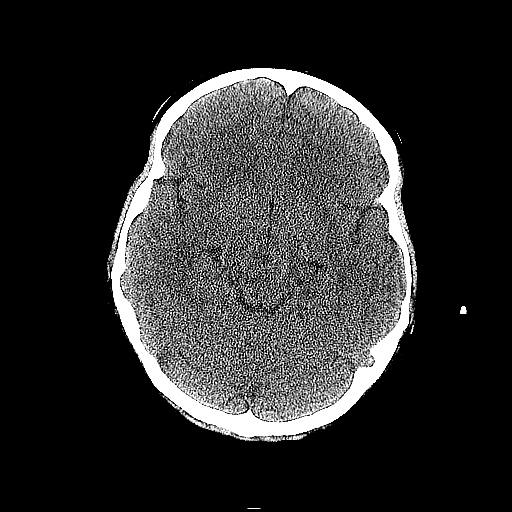
[im 20/64  bone]
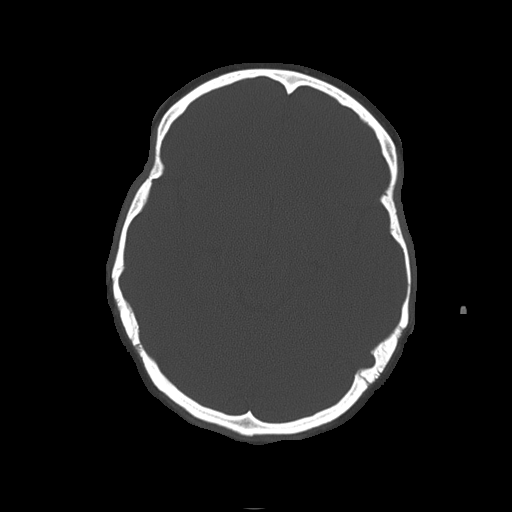
[im 24/64  brain]
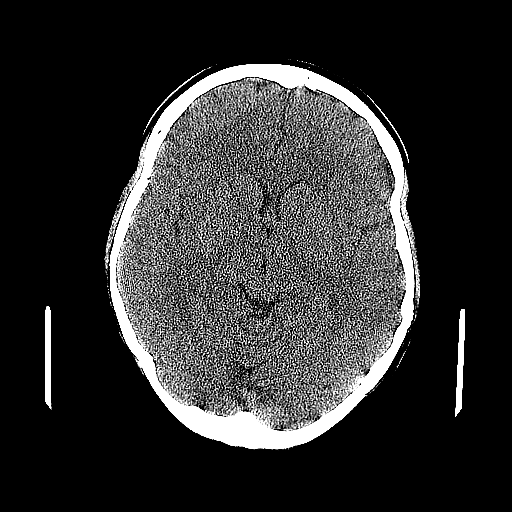
[im 27/64  brain]
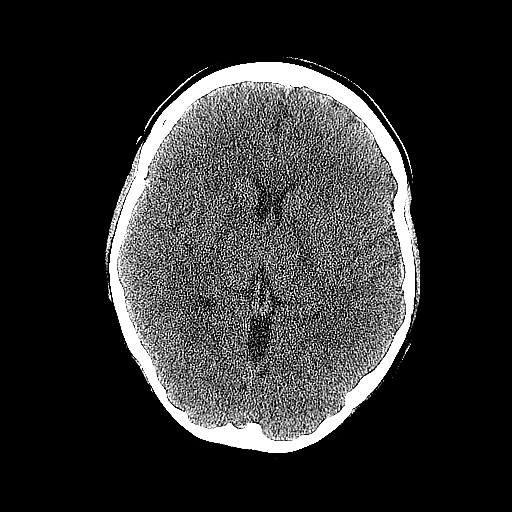
[im 30/64  brain]
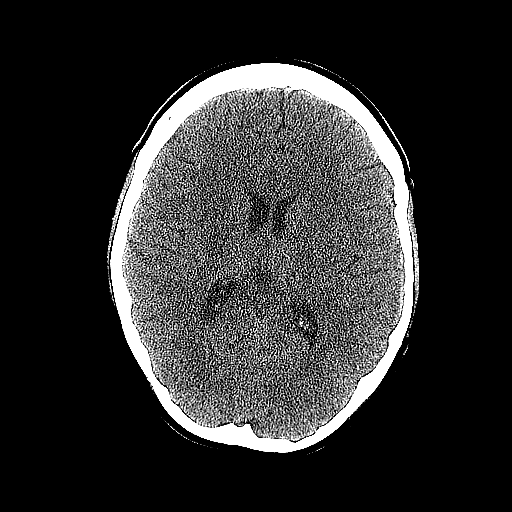
[im 34/64  brain]
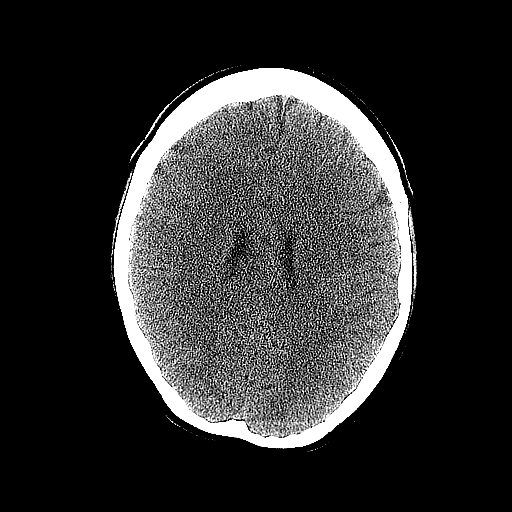
[im 34/64  bone]
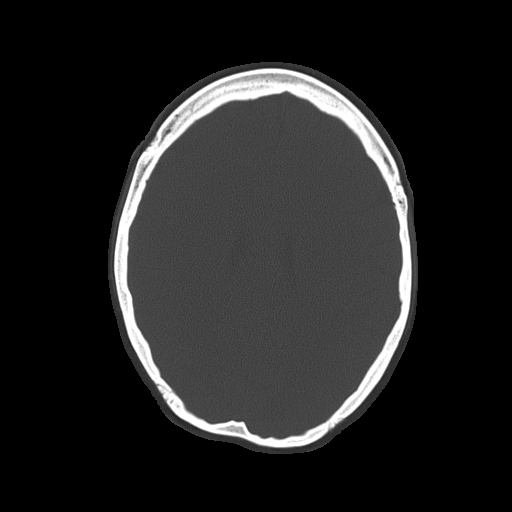
[im 37/64  brain]
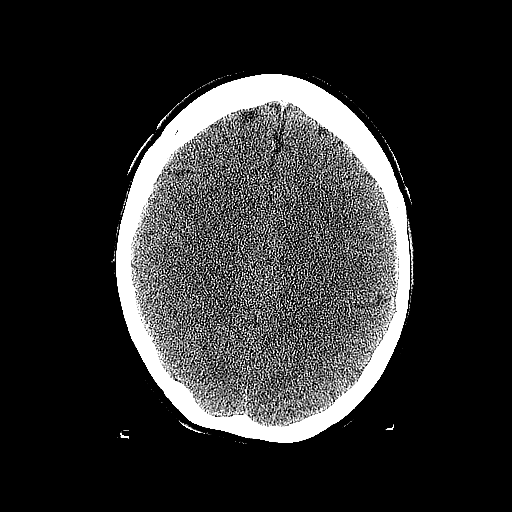
[im 40/64  brain]
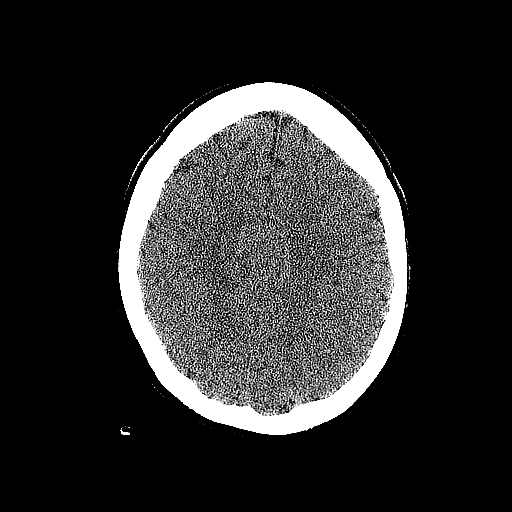
[im 44/64  brain]
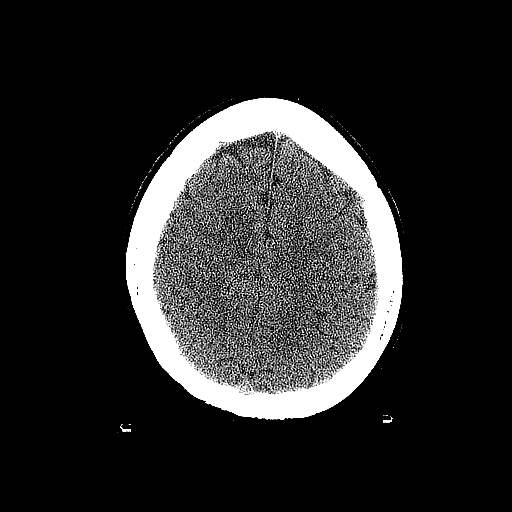
[im 50/64  brain]
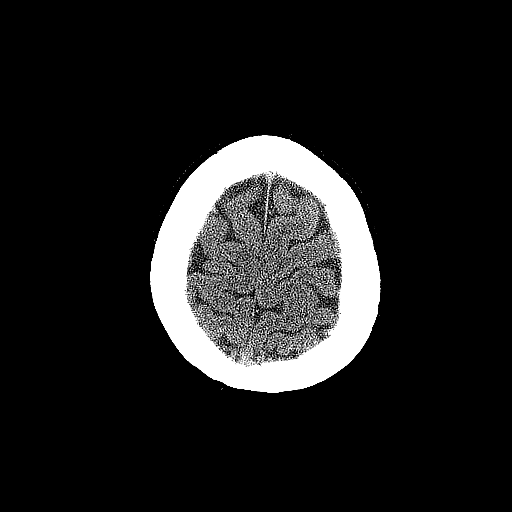
[im 50/64  bone]
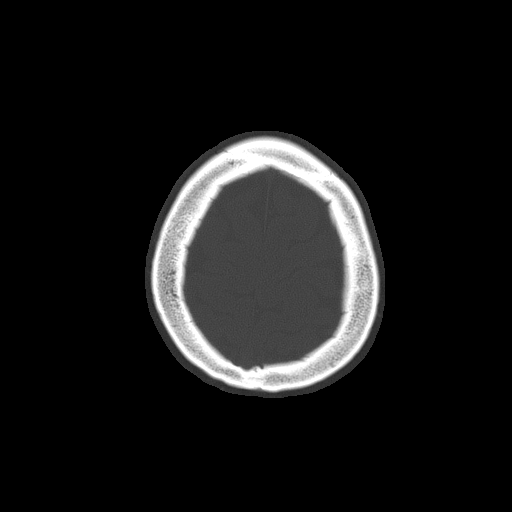
[im 54/64  brain]
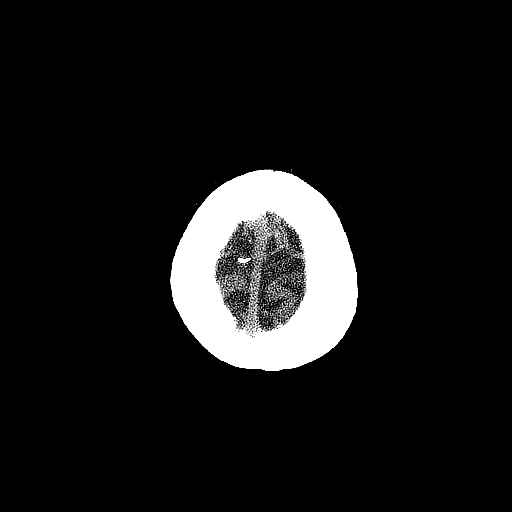
[im 57/64  brain]
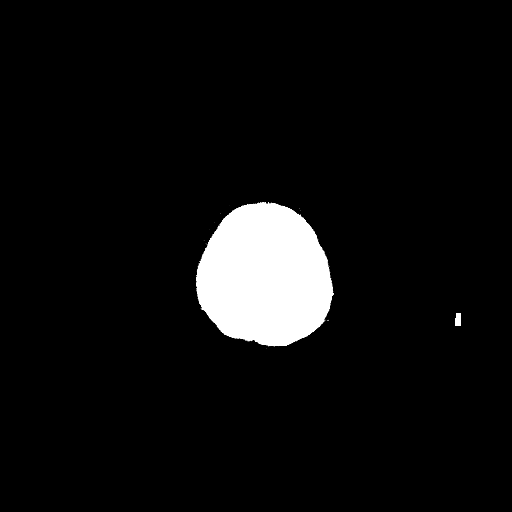
[im 60/64  brain]
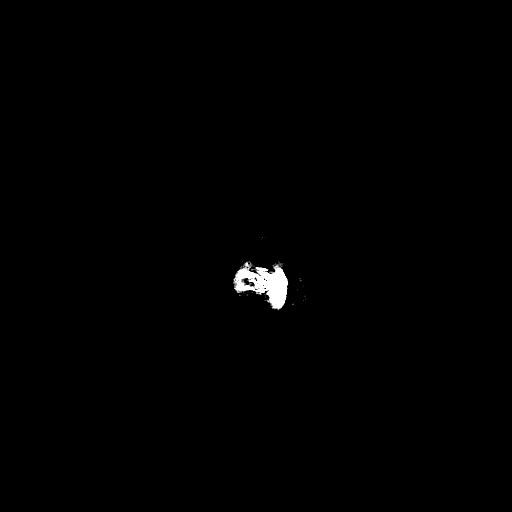

[16 of 30 positions shown; findings below may reference images not displayed]

FINDINGS: There is no evidence of intracranial hemorrhage, brain
edema, or other signs of acute infarction.  There is no evidence of
intracranial mass lesion or mass effect.  No abnormal extraaxial
fluid collections are identified.  There is no evidence of
hydrocephalus, or other significant intracranial abnormality.  No
skull abnormality identified.  Diffuse mucosal thickening is seen
involving the sphenoid sinus.
IMPRESSION: 1.  Negative non-contrast head CT.
2.  Chronic sphenoid sinusitis.

## 2014-08-06 NOTE — L&D Delivery Note (Signed)
Delivery Note  Patient was admitted in latent labor, progressed from 4 to 5 centimeters without augmentation, then received pitocin and AROM, and afterward rapidly proceeded to vaginal delivery.  At 6:20 PM a viable female was delivered via Vaginal, Spontaneous Delivery (Presentation: Left Occiput Anterior).  APGAR: 9, 9; weight pending  .   Placenta status: Intact, Spontaneous.  Cord: 3 vessels with the following complications: None.  Cord pH: not obtained.   Anesthesia: Epidural  Episiotomy: None Lacerations: None Est. Blood Loss (mL): 124  Mom to postpartum.  Baby to Couplet care / Skin to Skin.  Tammy Winters 04/07/2015, 6:55 PM

## 2014-09-01 IMAGING — CR DG CHEST 1V PORT
1 series · 1 of 1 positions shown · non-contrast
Comparison: 11/16/2012.

CLINICAL DATA: Altered mental status.  Diffuse body aches.

PORTABLE CHEST - 1 VIEW

[AP]
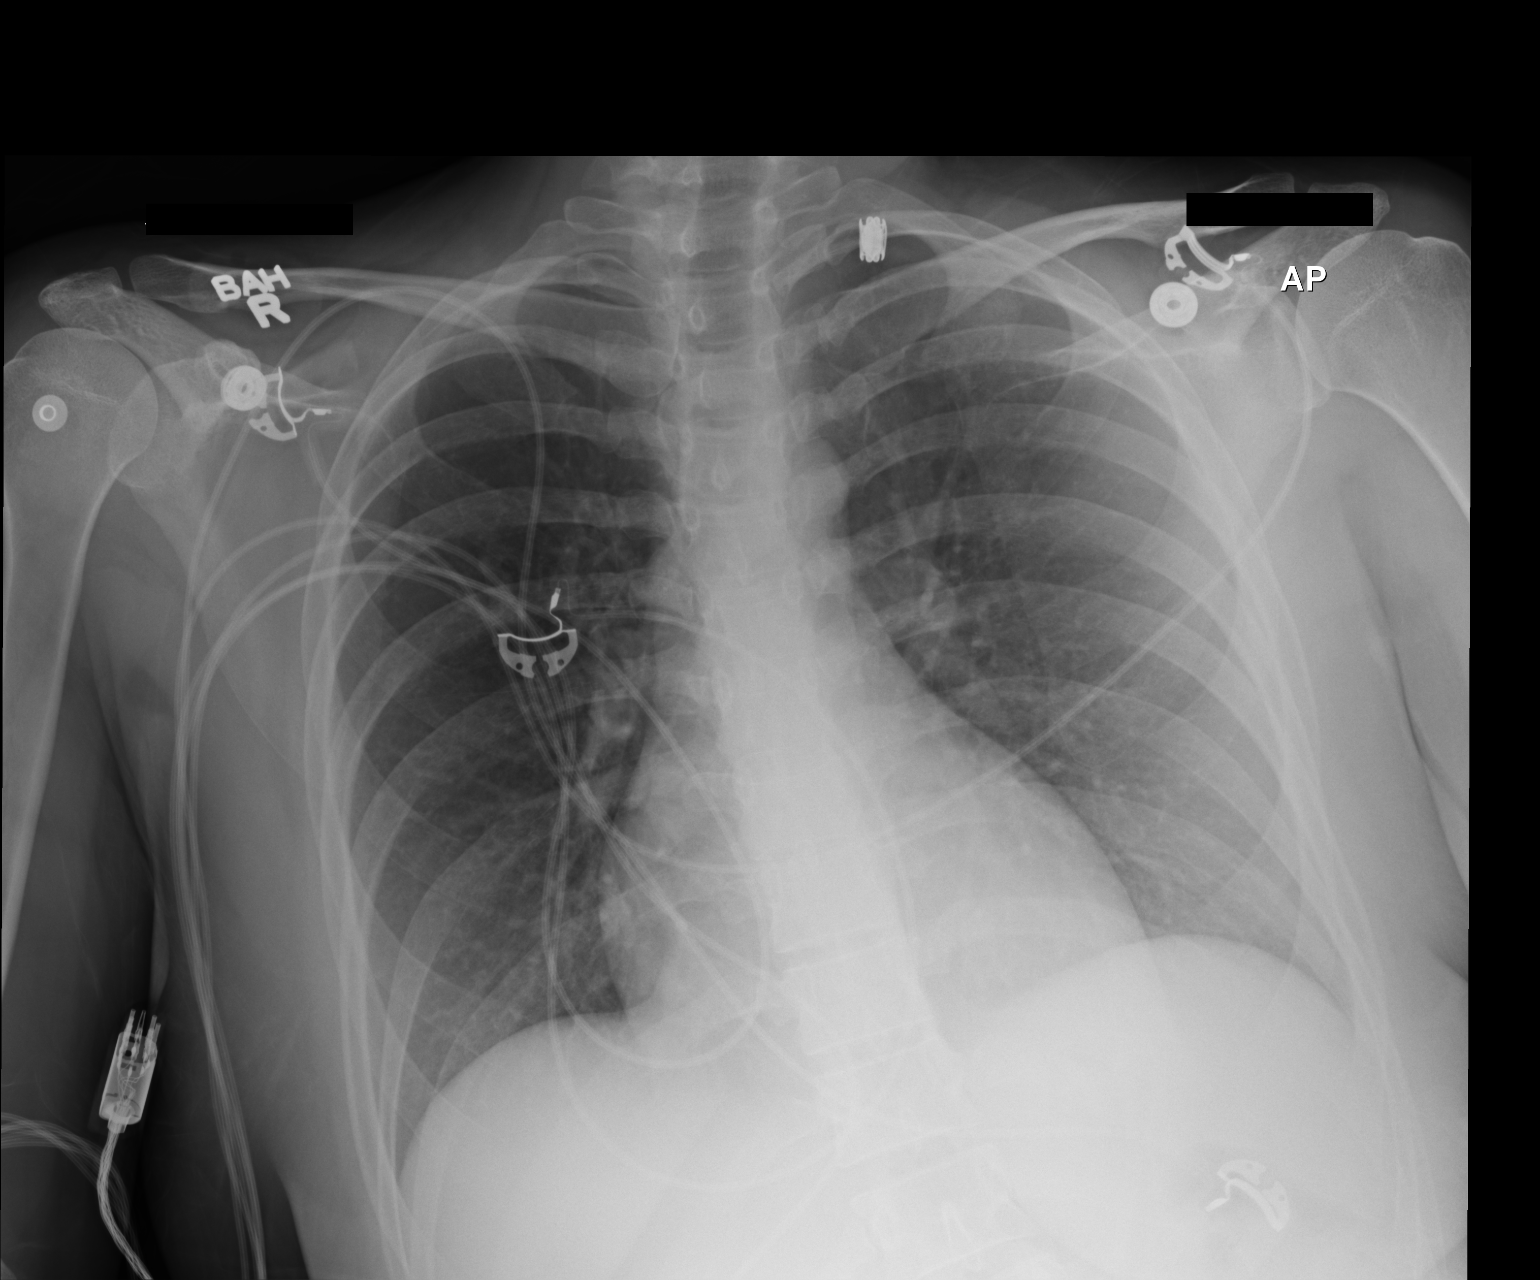

[1 of 1 positions shown; findings below may reference images not displayed]

FINDINGS: 4430 hours.  Interval extubation and removal of the
nasogastric tube.  Heart size and mediastinal contours are stable.
The lungs are clear.  There is no pleural effusion or pneumothorax.
There is a mild thoracolumbar scoliosis which may be positional.
Multiple telemetry leads overlie the chest.
IMPRESSION: No active cardiopulmonary process demonstrated.

## 2014-11-01 ENCOUNTER — Encounter: Payer: Self-pay | Admitting: *Deleted

## 2014-11-09 ENCOUNTER — Encounter: Payer: Self-pay | Admitting: Obstetrics and Gynecology

## 2014-11-09 ENCOUNTER — Encounter: Payer: Medicaid Other | Admitting: Obstetrics and Gynecology

## 2014-11-26 NOTE — H&P (Signed)
PATIENT NAME:  Tammy Winters, Tammy Winters MR#:  [id number] DATE OF BIRTH:  06/23/1986  DATE OF ADMISSION:  02/02/2013  CHIEF COMPLAINT: Multiple seizures within the last week.   PRIMARY CARE PHYSICIAN: None.   REFERRING PHYSICIAN: Jene Everyobert Kinner, MD  HISTORY OF PRESENT ILLNESS: The patient is a nice 29 year old female who has history of narcotic abuse in the past, chronic pain syndrome. She apparently has been put in jail. She states that she is not in jail. She is just finishing some of her time to be released, but apparently she has been taking methadone chronically and her dose was reduced and then taken off completely and she started having seizures for then.  She states that the seizures cannot stop.  The patient has been evaluated in several hospitals because of that. Apparently she had 10 seizures within 10 minutes, one after the other one,  for which she was brought today. The patient states that she is withdrawing from methadone and the guards definitely confirmed that she had seizure-like movements, but the seizures stopped just right when people talk to her. There was no postictal state and the patient did not have any incontinence of bladder or stool. Neurology was consulted via tele medicine. They recommended to increase the dose of Neurontin and do an EEG to check if this is a real seizure or not. The patient will definitely be monitored for a 24-hour EEG with camera monitoring. The patient is admitted for evaluation of this condition.   REVIEW OF SYSTEMS:  A 12 system review is done. CONSTITUTIONAL: The patient denies any fever, fatigue, weakness, weight loss or weight gain. She has pain all over her body.  EYES: Denies any blurry vision, double vision, inflammation.  ENT: No tinnitus, difficulty hearing. No difficulty swallowing. No nasal drip.  RESPIRATORY: No cough. No wheezing. No hemoptysis. No dyspnea. No COPD.  No painful respiration.  CARDIOVASCULAR: No chest pain, orthopnea, edema or  syncope.  GASTROINTESTINAL: Positive chronic nausea. No vomiting. No abdominal pain. No jaundice. No rectal bleeding or constipation.  GENITOURINARY: Positive dysuria. No hematuria. No kidney stones recently, but she has history of having a stent put on several years ago. The stent was on the right kidney, apparently. GYNECOLOGIC:  No discharge. No breast masses.  ENDOCRINE: No polyuria, polydipsia or polyphagia. No cold or heat intolerance. She takes medication for her thyroid. HEMATOLOGIC/LYMPHATIC: No anemia, easy bruising or swollen glands. SKIN: No rashes, petechiae or new lesions.  MUSCULOSKELETAL: Positive pain on neck, back, shoulder, knee, hip.  Every joint hurts. She says that her back is the main problem and this has been a chronic problem. She used to take methadone for that. No gout.  NEUROLOGIC: No numbness, weakness. No CVA or TIA.  Positive history of seizures from withdrawing of methadone, she states. PSYCHIATRIC:  Positive severe anxiety and mild depression.   PAST MEDICAL HISTORY: 1.  Seizure disorder.  2.  Hypothyroidism.  3.  Depression.  4.  Anxiety.  5.  Use and abuse of narcotic medication.   ALLERGIES: REGLAN AND ZOFRAN.   PAST SURGICAL HISTORY: 1.  Cholecystectomy while she was pregnant.  2.  Stent placed on the right ureter. 3.  Teeth surgery. All her teeth were removed. 4.  Two D and Cs.   SOCIAL HISTORY: The patient denies any alcohol. She smokes 1 pack of cigarettes a day and she used marijuana and used to abuse pills. Currently on methadone. She is incarcerated.  I am not quite sure of the charges.  FAMILY HISTORY: Positive for epilepsy in 1 cousin. She states that a lot of family members have cancer, female related cancer. Positive cardiac with MI in her grandmother and several aunts and diabetes in her grandmother.   CURRENT MEDICATIONS: Her list includes: 1.  Phenytoin 300 mg once a day at night. 2.  Librium 50 mg twice daily. 3.  Levothyroxine 125  mcg once daily. 4.  Keppra 1000 mg twice daily. 5.  Celexa 20 mg once a day.   LABORATORY DATA:  The patient is positive for methadone on her UA.  She stated nobody has given it to her in multiple days, but her urine is positive again.  PHYSICAL EXAMINATION: VITAL SIGNS: Blood pressure 108/66, pulse 72, respirations 18, temperature 98.4, 97% on room air.  GENERAL: Alert and oriented x 3. No acute distress. No respiratory distress. Hemodynamically stable.  HEENT: Pupils are equal and reactive. Extraocular movements are intact. Mucosa is moist. Anicteric sclerae. Pink conjunctivae. No oral lesions. No oropharyngeal exudates. The patient is normocephalic, atraumatic. Ears without any drainage or lesions. Nose without any nasal lesions or drainage.  NECK: Supple. No JVD. No thyromegaly. No adenopathy. No carotid bruits. No rigidity.  HEART:  Regular rate and rhythm. No murmurs, rubs or gallops are appreciated. No displacement of PMI. No tenderness to palpation on anterior chest wall.  LUNGS: Clear without any wheezing or crepitus. No use of accessory muscles.  ABDOMEN: Soft, nontender and nondistended. No hepatosplenomegaly. No masses. Bowel sounds are positive.  GENITOURINARY: Deferred.  MUSCULOSKELETAL: No significant joint effusions, joint abnormalities or swelling. Range of motion seems to be appropriate.  SKIN: No rashes or petechiae. The patient has several tattoos.  No signs of cellulitis.  LYMPHATIC: Negative for lymphadenopathy in neck or supraclavicular areas.  VASCULAR: Good pulses, +2 bilaterally. Capillary refill less than 3 seconds.  NEUROLOGIC: Cranial nerves II through XII intact. DTRs +2. Strength is 5 out of 5 in all 4 extremities. Sensation seems to be appropriate distally.  GENERAL: The patient is alert and oriented x 3.  PSYCHIATRIC: Alert and oriented x 3. No significant agitation.  LABORATORY DATA:  Glucose 111, BUN 11, creatinine 0.64, potassium 3.4, sodium 140. Other  electrolytes within normal limits. Ethanol level is negative. LFTs within normal limits. Dilantin level 12.6, normal, on the lower range.  Urinalysis positive for benzos, positive for marijuana and positive for methadone. White count 8.0, hemoglobin 14, platelet count 240. Urinalysis with 27 white blood cells, +2 bacteria, positive leukocytes and positive nitrites.   ASSESSMENT AND PLAN: This is a 29 year old female with history of anxiety, depression and hypothyroidism who is being incarcerated apparently for some dropped charges, but I am not quite sure or clear of the details. The patient is brought up due to multiple seizures.  1.  Seizure disorder. The patient takes Dilantin and Keppra.  The description of her seizures seems to be more like conversion disorder versus malingering. The patient has been seizing for multiple occasions or having seizure-like disorder on multiple occasions and every time apparently she comes out of it whenever somebody talks to her. There is no reported postictal state and there is no reported bowel incontinence or urinary incontinence. We are going to admit her, get a consult with neurology and also increase her Dilantin, as recommendation from tele neurology. The patient is going to have an EEG and she might benefit from 24 hour video monitoring, but I am not quite sure that is going to be impossible to be done.  2.  Chronic use of opiates. At this moment, the patient says she has been withdrawing for pain. She is maintenance on her system. The patient has not had any narcotics lately for what I am not going to add on anymore as she is going to go to prison and not have them available anyway, so it will be better to do a transition with withdrawal medications and I am going to add clonidine to help her out with her withdrawal. Her blood pressure seems to be stable for what clonidine would not create any major problems.  The patient also is going to be getting medications for  anxiety including Vistaril and continue her Librium. Awaiting neurology consultation.  3.  Hyponatremia. Treated with IV fluids with potassium.  4.  Urinary tract infection. Urine culture collected. Start on Rocephin.  5.  Deep vein thrombosis prophylaxis with Lovenox as the patient is chained to the bed and she is not going to be moving much.  6.  Marijuana abuse and tobacco abuse.  The patient has been counseling for over 5 minutes about need for quitting narcotics, benzos and marijuana.  7.  Other medical problems are stable.  8.  Gastrointestinal prophylaxis with Pepcid.   TIME SPENT:  About 45 minutes with this patient today. ____________________________ Felipa Furnace, MD rsg:sb D: 02/02/2013 07:46:53 ET T: 02/02/2013 08:09:20 ET JOB#: [id number]  cc: Felipa Furnace, MD, <Dictator> Jaelynn Currier Juanda Chance MD ELECTRONICALLY SIGNED 02/04/2013 15:13

## 2014-11-26 NOTE — Discharge Summary (Signed)
PATIENT NAME:  Rozann LeschesBIDDIX, Darryl MR#:  [id number] DATE OF BIRTH:  [birth year]  DATE OF ADMISSION:  02/02/2013 DATE OF DISCHARGE:  02/03/2013  DISCHARGE DIAGNOSES:  1.  Pseudoseizures.  2.  Noncompliance.  3.  Cannabinoid and narcotic abuse.  4.  Gram-negative rod urinary tract infection.   ADMITTING HISTORY AND PHYSICAL: Please see detailed H and P dictated by Dr. Mordecai MaesSanchez. In brief, a 29 year old female patient with a history of narcotic abuse in the past, chronic pain syndrome, who presented to the hospital from prison after she had 10 episodes of seizures in 10 minutes. This was witnessed by the prison staff. The patient was arrested for unknown reasons. The patient was seen by attending neurology, who suggested increasing the dose on her Dilantin. The patient had an EEG, which showed no seizure activity. The patient has had extensive workup in the past and has diagnosis of pseudoseizures.   The patient did well during the hospital stay with no seizures. She was continued to be monitored, but on the day of discharge, the patient in a rush wanted to leave against medical advice. I counseled the patient to stay in the hospital for further workup and treatment. The patient did not want any prescriptions and left against medical advice.   Time spent on day of discharge on this case was greater than 30 minutes.  ____________________________ Molinda BailiffSrikar R. Javonnie Illescas, MD srs:aw D: 02/03/2013 14:57:24 ET T: 02/03/2013 15:17:46 ET JOB#: [id number]  cc: Wardell HeathSrikar R. Terilyn Sano, MD, <Dictator> Orie FishermanSRIKAR R Amarri Michaelson MD ELECTRONICALLY SIGNED 02/06/2013 13:09

## 2014-12-01 ENCOUNTER — Emergency Department (HOSPITAL_COMMUNITY)
Admission: EM | Admit: 2014-12-01 | Discharge: 2014-12-02 | Discharge status: Against Medical Advice | Payer: Medicaid Other | Attending: Emergency Medicine | Admitting: Emergency Medicine

## 2014-12-01 ENCOUNTER — Encounter (HOSPITAL_COMMUNITY): Payer: Self-pay | Admitting: Emergency Medicine

## 2014-12-01 ENCOUNTER — Emergency Department (HOSPITAL_COMMUNITY): Payer: Medicaid Other

## 2014-12-01 DIAGNOSIS — N189 Chronic kidney disease, unspecified: Secondary | ICD-10-CM | POA: Diagnosis not present

## 2014-12-01 DIAGNOSIS — O9989 Other specified diseases and conditions complicating pregnancy, childbirth and the puerperium: Secondary | ICD-10-CM | POA: Insufficient documentation

## 2014-12-01 DIAGNOSIS — O26831 Pregnancy related renal disease, first trimester: Secondary | ICD-10-CM | POA: Insufficient documentation

## 2014-12-01 DIAGNOSIS — O46011 Antepartum hemorrhage with afibrinogenemia, first trimester: Secondary | ICD-10-CM | POA: Insufficient documentation

## 2014-12-01 DIAGNOSIS — Z79899 Other long term (current) drug therapy: Secondary | ICD-10-CM | POA: Insufficient documentation

## 2014-12-01 DIAGNOSIS — R1030 Lower abdominal pain, unspecified: Secondary | ICD-10-CM | POA: Diagnosis not present

## 2014-12-01 DIAGNOSIS — Z8639 Personal history of other endocrine, nutritional and metabolic disease: Secondary | ICD-10-CM | POA: Diagnosis not present

## 2014-12-01 DIAGNOSIS — O99331 Smoking (tobacco) complicating pregnancy, first trimester: Secondary | ICD-10-CM | POA: Insufficient documentation

## 2014-12-01 DIAGNOSIS — O99351 Diseases of the nervous system complicating pregnancy, first trimester: Secondary | ICD-10-CM | POA: Insufficient documentation

## 2014-12-01 DIAGNOSIS — Z792 Long term (current) use of antibiotics: Secondary | ICD-10-CM | POA: Diagnosis not present

## 2014-12-01 DIAGNOSIS — Z8659 Personal history of other mental and behavioral disorders: Secondary | ICD-10-CM | POA: Insufficient documentation

## 2014-12-01 DIAGNOSIS — R21 Rash and other nonspecific skin eruption: Secondary | ICD-10-CM | POA: Insufficient documentation

## 2014-12-01 DIAGNOSIS — F17200 Nicotine dependence, unspecified, uncomplicated: Secondary | ICD-10-CM | POA: Diagnosis not present

## 2014-12-01 DIAGNOSIS — Z8719 Personal history of other diseases of the digestive system: Secondary | ICD-10-CM | POA: Insufficient documentation

## 2014-12-01 DIAGNOSIS — G8929 Other chronic pain: Secondary | ICD-10-CM | POA: Diagnosis not present

## 2014-12-01 DIAGNOSIS — Z5320 Procedure and treatment not carried out because of patient's decision for unspecified reasons: Secondary | ICD-10-CM

## 2014-12-01 DIAGNOSIS — O209 Hemorrhage in early pregnancy, unspecified: Secondary | ICD-10-CM

## 2014-12-01 DIAGNOSIS — R102 Pelvic and perineal pain: Secondary | ICD-10-CM | POA: Diagnosis not present

## 2014-12-01 DIAGNOSIS — Z5329 Procedure and treatment not carried out because of patient's decision for other reasons: Secondary | ICD-10-CM

## 2014-12-01 DIAGNOSIS — R000 Tachycardia, unspecified: Secondary | ICD-10-CM | POA: Insufficient documentation

## 2014-12-01 DIAGNOSIS — Z3A00 Weeks of gestation of pregnancy not specified: Secondary | ICD-10-CM | POA: Diagnosis not present

## 2014-12-01 LAB — CBC
HEMATOCRIT: 35.8 % — AB (ref 36.0–46.0)
Hemoglobin: 12.4 g/dL (ref 12.0–15.0)
MCH: 30.1 pg (ref 26.0–34.0)
MCHC: 34.6 g/dL (ref 30.0–36.0)
MCV: 86.9 fL (ref 78.0–100.0)
Platelets: 220 10*3/uL (ref 150–400)
RBC: 4.12 MIL/uL (ref 3.87–5.11)
RDW: 14.1 % (ref 11.5–15.5)
WBC: 9.8 10*3/uL (ref 4.0–10.5)

## 2014-12-01 LAB — POC URINE PREG, ED: PREG TEST UR: POSITIVE — AB

## 2014-12-01 MED ORDER — SODIUM CHLORIDE 0.9 % IV SOLN: 1000.0000 mL | Freq: Once | INTRAVENOUS | Status: DC

## 2014-12-01 MED ORDER — SODIUM CHLORIDE 0.9 % IV SOLN: 1000.0000 mL | INTRAVENOUS | Status: DC

## 2014-12-01 NOTE — ED Provider Notes (Signed)
CSN: [id number]     Arrival date & time 12/01/14  2250 History  This chart was scribed for Tammy Boozeavid Pura Picinich, MD by Tanda RockersMargaux Venter, ED Scribe. [facility] and the patient's care was started at 11:35 PM.    Chief Complaint  Patient presents with  . Abdominal Cramping  . Vaginal Bleeding   The history is provided by the patient. No language interpreter was used.     HPI Comments: Tammy Winters is a 29 y.o. female G5P3A1 who presents to the Emergency Department complaining of intermittent abdominal cramping that began 1 week ago, worsening today. Pt reports that she was at [employer] earlier today and began feeling dizzy prior to having syncopal episode. She reports that after the episode her abdominal cramping was more severe, causing concern. The pain was at a 10/10 on the pain scale at its worse. Pt states that it is now a 6/10. She also reports mild vaginal spotting that has since resolved. Pt complains of rash to arms, legs, and trunk as well. She reports that she recently had a child 9 months ago. She is currently 3-4 months pregnant and complaining of breast swelling and tenderness, nausea, vomiting, chills, and sweats. She reports that these symptoms are normal for her pregnancies. Pt has not had ultrasound for this pregnancy and is not currently on prenatal vitamins. Denies fevers or any other symptoms.    Past Medical History  Diagnosis Date  . Drug addict   . Heroin abuse   . Anxiety   . GERD (gastroesophageal reflux disease)   . History of stomach ulcers   . 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 Chronic lower back pain   . Chronic kidney disease   . Pyelonephritis   . Hypothyroidism    Past Surgical History  Procedure Laterality Date  . Cholecystectomy    . Dilation and curettage of uterus      "I've had 3" (01/26/2013)  . Multiple tooth extractions      "I've had all my teeth removed" (01/26/2013)  . Ureteral stent placement      Had  removed later  . Dilation and curettage of uterus     Family History  Problem Relation Age of Onset  . Diabetes Mother   . Cancer Mother   . Diabetes Father   . Cancer Father    History  Substance Use Topics  . Smoking status: Current Some Day Smoker -- 1.50 packs/day for 12 years    Types: Cigarettes  . Smokeless tobacco: Never Used  . Alcohol Use: Yes     Comment: one beer per week   OB History    Gravida Para Term Preterm AB TAB SAB Ectopic Multiple Living   4 2 2  1  1   2      Review of Systems  Constitutional: Positive for chills and diaphoresis. Negative for fever.  Gastrointestinal: Positive for nausea, vomiting and abdominal pain.  Genitourinary: Positive for vaginal bleeding.  Skin: Positive for rash.  Neurological: Positive for dizziness and syncope.  All other systems reviewed and are negative.     Allergies  Adhesive; Reglan; and Zofran  Home Medications   Prior to Admission medications   Medication Sig Start Date End Date Taking? Authorizing Provider  methadone (DOLOPHINE) 10 MG/5ML solution Take 60 mg by mouth daily.    Historical Provider, MD  sulfamethoxazole-trimethoprim (BACTRIM DS) 800-160 MG per tablet Take 1 tablet by mouth  daily.    Historical Provider, MD   Triage Vitals: BP 134/64 mmHg  Pulse 130  Temp(Src) 98.5 F (36.9 C)  Resp 16  Ht  (1.575 m)  Wt 169 lb (76.658 kg)  BMI 30.90 kg/m2  SpO2 100%   Physical Exam  Constitutional: She is oriented to person, place, and time. She appears well-developed and well-nourished. No distress.  HENT:  Head: Normocephalic and atraumatic.  Eyes: Conjunctivae and EOM are normal. Pupils are equal, round, and reactive to light.  Neck: Normal range of motion. Neck supple. No JVD present.  Cardiovascular: Regular rhythm and normal heart sounds.  Tachycardia present.   No murmur heard. Pulmonary/Chest: Effort normal and breath sounds normal. She has no wheezes. She has no rales. She exhibits no  tenderness.  Abdominal: Soft. Bowel sounds are normal. She exhibits no mass. There is tenderness (Mild suprapubic tenderness. ). There is no rebound.  Musculoskeletal: Normal range of motion. She exhibits no edema.  Lymphadenopathy:    She has no cervical adenopathy.  Neurological: She is alert and oriented to person, place, and time. No cranial nerve deficit. She exhibits normal muscle tone. Coordination normal.  Skin: Skin is warm and dry. Rash noted.  Multiple 3-4 mm erythematous lesions with excoriation present on arms, legs, and trunk.   Psychiatric: She has a normal mood and affect. Her behavior is normal. Thought content normal.  Nursing note and vitals reviewed.   ED Course  Procedures (including critical care time)  DIAGNOSTIC STUDIES: Oxygen Saturation is 100% on RA, normal by my interpretation.    COORDINATION OF CARE: 11:41 PM-Discussed treatment plan which includes Transvaginal ultrasound, BMP, Urine pregnancy, hCG quant, CBC with pt at bedside and pt agreed to plan.   Labs Review Results for orders placed or performed during the hospital encounter of 12/01/14  hCG, quantitative, pregnancy(IF PREGNANT)  Result Value Ref Range   hCG, Beta Chain, Quant, S 23240 (H) <5 mIU/mL  CBC (add if pt pregnant)  Result Value Ref Range   WBC 9.8 4.0 - 10.5 K/uL   RBC 4.12 3.87 - 5.11 MIL/uL   Hemoglobin 12.4 12.0 - 15.0 g/dL   HCT 11.9 (L) 14.7 - 82.9 %   MCV 86.9 78.0 - 100.0 fL   MCH 30.1 26.0 - 34.0 pg   MCHC 34.6 30.0 - 36.0 g/dL   RDW 56.2 13.0 - 86.5 %   Platelets 220 150 - 400 K/uL  Basic metabolic panel  Result Value Ref Range   Sodium 136 135 - 145 mmol/L   Potassium 3.2 (L) 3.5 - 5.1 mmol/L   Chloride 104 96 - 112 mmol/L   CO2 24 19 - 32 mmol/L   Glucose, Bld 94 70 - 99 mg/dL   BUN <5 (L) 6 - 23 mg/dL   Creatinine, Ser 7.84 0.50 - 1.10 mg/dL   Calcium 8.9 8.4 - 69.6 mg/dL   GFR calc non Af Amer >90 >90 mL/min   GFR calc Af Amer >90 >90 mL/min   Anion gap 8 5  - 15  Differential  Result Value Ref Range   Neutrophils Relative % 58 43 - 77 %   Neutro Abs 5.1 1.7 - 7.7 K/uL   Lymphocytes Relative 33 12 - 46 %   Lymphs Abs 2.9 0.7 - 4.0 K/uL   Monocytes Relative 6 3 - 12 %   Monocytes Absolute 0.5 0.1 - 1.0 K/uL   Eosinophils Relative 3 0 - 5 %   Eosinophils Absolute 0.2  0.0 - 0.7 K/uL   Basophils Relative 0 0 - 1 %   Basophils Absolute 0.0 0.0 - 0.1 K/uL  POC Urine Pregnancy, ED (do NOT order at [facility])  Result Value Ref Range   Preg Test, Ur POSITIVE (A) NEGATIVE  ABO/Rh (If pregnant)  Result Value Ref Range   ABO/RH(D) AB POS    No rh immune globuloin NOT A RH IMMUNE GLOBULIN CANDIDATE, PT RH POSITIVE   Type and screen for Red Blood Exchange  Result Value Ref Range   ABO/RH(D) AB POS    Antibody Screen NEG    Sample Expiration 12/04/2014      MDM   Final diagnoses:  First trimester bleeding  Pelvic pain in female  Left against medical advice    Pelvic cramping and spotting in setting of early pregnancy worrisome for possible ectopic pregnancy. This was explained to the patient and workup was initiated. She was getting prepared to be set up for pelvic exam when she got word that her boyfriend was going to take her child and she became agitated and stated that she had to leave 5. She was advised of the risk of ectopic pregnancy including risk of death and she stated that she had to leave to get her child and would come back as soon as possible. She left AGAINST MEDICAL ADVICE before pelvic exam or pelvic ultrasound could be done.   I personally performed the services described in this documentation, which was scribed in my presence. The recorded information has been reviewed and is accurate.       Tammy Booze, MD 12/02/14 [phone]

## 2014-12-01 NOTE — ED Notes (Addendum)
Pt states she is 4 months pregnant.  Reports syncopal episode while [place] [facility] [employer] today.  States she was completely "passed out"- unsure how long.  Reports lower abd cramping that started 1 hour later and bright red vaginal bleeding "spots".  C/o nausea.  Pt states she has a 279 month old at home. She has not had an US this pregnancy to confirm intrauterine pregnancy.

## 2014-12-02 LAB — BASIC METABOLIC PANEL
Anion gap: 8 (ref 5–15)
BUN: <5 mg/dL — ABNORMAL LOW (ref 6–23)
CHLORIDE: 104 mmol/L (ref 96–112)
CO2: 24 mmol/L (ref 19–32)
CREATININE: 0.52 mg/dL (ref 0.50–1.10)
Calcium: 8.9 mg/dL (ref 8.4–10.5)
GFR calc non Af Amer: >90 mL/min (ref >90)
Glucose, Bld: 94 mg/dL (ref 70–99)
POTASSIUM: 3.2 mmol/L — AB (ref 3.5–5.1)
Sodium: 136 mmol/L (ref 135–145)

## 2014-12-02 LAB — TYPE AND SCREEN
ABO/RH(D): AB POS
Antibody Screen: NEGATIVE

## 2014-12-02 LAB — DIFFERENTIAL
Basophils Absolute: 0 10*3/uL (ref 0.0–0.1)
Basophils Relative: 0 % (ref 0–1)
Eosinophils Absolute: 0.2 10*3/uL (ref 0.0–0.7)
Eosinophils Relative: 3 % (ref 0–5)
LYMPHS ABS: 2.9 10*3/uL (ref 0.7–4.0)
Lymphocytes Relative: 33 % (ref 12–46)
Monocytes Absolute: 0.5 10*3/uL (ref 0.1–1.0)
Monocytes Relative: 6 % (ref 3–12)
NEUTROS PCT: 58 % (ref 43–77)
Neutro Abs: 5.1 10*3/uL (ref 1.7–7.7)

## 2014-12-02 LAB — ABO/RH: ABO/RH(D): AB POS

## 2014-12-02 LAB — HIV ANTIBODY (ROUTINE TESTING W REFLEX): HIV Screen 4th Generation wRfx: NONREACTIVE

## 2014-12-02 LAB — HCG, QUANTITATIVE, PREGNANCY: HCG, BETA CHAIN, QUANT, S: 23240 m[IU]/mL — AB (ref <5)

## 2014-12-02 LAB — RPR: RPR: NONREACTIVE

## 2014-12-02 NOTE — Discharge Instructions (Signed)
The bleeding and cramping that you have is very worrisome for possible ectopic pregnancy. Untreated ectopic pregnancy is very dangerous and can be fatal. Please return to the emergency department or to [facility] as soon as possible.  Vaginal Bleeding During Pregnancy, First Trimester A small amount of bleeding (spotting) from the vagina is relatively common in early pregnancy. It usually stops on its own. Various things may cause bleeding or spotting in early pregnancy. Some bleeding may be related to the pregnancy, and some may not. In most cases, the bleeding is normal and is not a problem. However, bleeding can also be a sign of something serious. Be sure to tell your health care provider about any vaginal bleeding right away. Some possible causes of vaginal bleeding during the first trimester include:  Infection or inflammation of the cervix.  Growths (polyps) on the cervix.  Miscarriage or threatened miscarriage.  Pregnancy tissue has developed outside of the uterus and in a fallopian tube (tubal pregnancy).  Tiny cysts have developed in the uterus instead of pregnancy tissue (molar pregnancy). HOME CARE INSTRUCTIONS  Watch your condition for any changes. The following actions may help to lessen any discomfort you are feeling:  Follow your health care provider's instructions for limiting your activity. If your health care provider orders bed rest, you may need to stay in bed and only get up to use the bathroom. However, your health care provider may allow you to continue light activity.  If needed, make plans for someone to help with your regular activities and responsibilities while you are on bed rest.  Keep track of the number of pads you use each day, how often you change pads, and how soaked (saturated) they are. Write this down.  Do not use tampons. Do not douche.  Do not have sexual intercourse or orgasms until approved by your health care provider.  If  you pass any tissue from your vagina, save the tissue so you can show it to your health care provider.  Only take over-the-counter or prescription medicines as directed by your health care provider.  Do not take aspirin because it can make you bleed.  Keep all follow-up appointments as directed by your health care provider. SEEK MEDICAL CARE IF:  You have any vaginal bleeding during any part of your pregnancy.  You have cramps or labor pains.  You have a fever, not controlled by medicine. SEEK IMMEDIATE MEDICAL CARE IF:   You have severe cramps in your back or belly (abdomen).  You pass large clots or tissue from your vagina.  Your bleeding increases.  You feel light-headed or weak, or you have fainting episodes.  You have chills.  You are leaking fluid or have a gush of fluid from your vagina.  You pass out while having a bowel movement. MAKE SURE YOU:  Understand these instructions.  Will watch your condition.  Will get help right away if you are not doing well or get worse. Document Released: 05/02/2005 Document Revised: 07/28/2013 Document Reviewed: 03/30/2013 [facility] Patient Information 2015 [place]. This information is not intended to replace advice given to you by your health care provider. Make sure you discuss any questions you have with your health care provider.  Ectopic Pregnancy An ectopic pregnancy is when the fertilized egg attaches (implants) outside the uterus. Most ectopic pregnancies occur in the fallopian tube. Rarely do ectopic pregnancies occur on the ovary, intestine, pelvis, or cervix. In an ectopic pregnancy, the fertilized egg does not  have the ability to develop into a normal, healthy baby.  A ruptured ectopic pregnancy is one in which the fallopian tube gets torn or bursts and results in internal bleeding. Often there is intense abdominal pain, and sometimes, vaginal bleeding. Having an ectopic pregnancy can be life threatening. If left  untreated, this dangerous condition can lead to a blood transfusion, abdominal surgery, or even death. CAUSES  Damage to the fallopian tubes is the suspected cause in most ectopic pregnancies.  RISK FACTORS Depending on your circumstances, the risk of having an ectopic pregnancy will vary. The level of risk can be divided into three categories. High Risk  You have gone through infertility treatment.  You have had a previous ectopic pregnancy.  You have had previous tubal surgery.  You have had previous surgery to have the fallopian tubes tied (tubal ligation).  You have tubal problems or diseases.  You have been exposed to DES. DES is a medicine that was used until 1971 and had effects on babies whose mothers took the medicine.  You become pregnant while using an intrauterine device (IUD) for birth control. Moderate Risk  You have a history of infertility.  You have a history of a sexually transmitted infection (STI).  You have a history of pelvic inflammatory disease (PID).  You have scarring from endometriosis.  You have multiple sexual partners.  You smoke. Low Risk  You have had previous pelvic surgery.  You use vaginal douching.  You became sexually active before 30 years of age. SIGNS AND SYMPTOMS  An ectopic pregnancy should be suspected in anyone who has missed a period and has abdominal pain or bleeding.  You may experience normal pregnancy symptoms, such as:  Nausea.  Tiredness.  Breast tenderness.  Other symptoms may include:  Pain with intercourse.  Irregular vaginal bleeding or spotting.  Cramping or pain on one side or in the lower abdomen.  Fast heartbeat.  Passing out while having a bowel movement.  Symptoms of a ruptured ectopic pregnancy and internal bleeding may include:  Sudden, severe pain in the abdomen and pelvis.  Dizziness or fainting.  Pain in the shoulder area. DIAGNOSIS  Tests that may be performed include:  A  pregnancy test.  An ultrasound test.  Testing the specific level of pregnancy hormone in the bloodstream.  Taking a sample of uterus tissue (dilation and curettage, D&C).  Surgery to perform a visual exam of the inside of the abdomen using a thin, lighted tube with a tiny camera on the end (laparoscope). TREATMENT  An injection of a medicine called methotrexate may be given. This medicine causes the pregnancy tissue to be absorbed. It is given if:  The diagnosis is made early.  The fallopian tube has not ruptured.  You are considered to be a good candidate for the medicine. Usually, pregnancy hormone blood levels are checked after methotrexate treatment. This is to be sure the medicine is effective. It may take 4-6 weeks for the pregnancy to be absorbed (though most pregnancies will be absorbed by 3 weeks). Surgical treatment may be needed. A laparoscope may be used to remove the pregnancy tissue. If severe internal bleeding occurs, a cut (incision) may be made in the lower abdomen (laparotomy), and the ectopic pregnancy is removed. This stops the bleeding. Part of the fallopian tube, or the whole tube, may be removed as well (salpingectomy). After surgery, pregnancy hormone tests may be done to be sure there is no pregnancy tissue left. You may receive a  Rho (D) immune globulin shot if you are Rh negative and the father is Rh positive, or if you do not know the Rh type of the father. This is to prevent problems with any future pregnancy. SEEK IMMEDIATE MEDICAL CARE IF:  You have any symptoms of an ectopic pregnancy. This is a medical emergency. MAKE SURE YOU:  Understand these instructions.  Will watch your condition.  Will get help right away if you are not doing well or get worse. Document Released: 08/30/2004 Document Revised: 12/07/2013 Document Reviewed: 02/19/2013 [facility] Patient Information 2015 [place], [place]. This information is not intended to replace advice given to you by  your health care provider. Make sure you discuss any questions you have with your health care provider.  Discharge Against Medical Advice I am signing this paper to show that I am leaving this hospital or health care center of my own free will. It is done against all medical advice. In doing so, I am releasing this hospital or health care center and the attending physicians from any and all claims that I may want to make. I understand that further care has been recommended. My condition may worsen. This could cause me further bodily injury, illness, or even death. I do know that the medical staff has fully explained to me the risk that I am taking in leaving against medical advice. Document Released: 07/23/2005 Document Revised: 10/15/2011 Document Reviewed: 01/07/2007 [facility] Patient Information 2015 [place], [place]. This information is not intended to replace advice given to you by your health care provider. Make sure you discuss any questions you have with your health care provider.

## 2014-12-26 ENCOUNTER — Encounter (HOSPITAL_COMMUNITY): Payer: Self-pay

## 2014-12-26 ENCOUNTER — Inpatient Hospital Stay (HOSPITAL_COMMUNITY)
Admission: AD | Admit: 2014-12-26 | Discharge: 2014-12-26 | Disposition: A | Discharge status: Home / Self Care | Payer: Medicaid Other | Source: Ambulatory Visit | Attending: Obstetrics & Gynecology | Admitting: Obstetrics & Gynecology

## 2014-12-26 ENCOUNTER — Inpatient Hospital Stay (HOSPITAL_COMMUNITY): Payer: Medicaid Other

## 2014-12-26 DIAGNOSIS — R109 Unspecified abdominal pain: Secondary | ICD-10-CM | POA: Diagnosis present

## 2014-12-26 DIAGNOSIS — E039 Hypothyroidism, unspecified: Secondary | ICD-10-CM | POA: Diagnosis not present

## 2014-12-26 DIAGNOSIS — Z3A22 22 weeks gestation of pregnancy: Secondary | ICD-10-CM | POA: Insufficient documentation

## 2014-12-26 DIAGNOSIS — O99282 Endocrine, nutritional and metabolic diseases complicating pregnancy, second trimester: Secondary | ICD-10-CM | POA: Insufficient documentation

## 2014-12-26 DIAGNOSIS — O26899 Other specified pregnancy related conditions, unspecified trimester: Secondary | ICD-10-CM

## 2014-12-26 DIAGNOSIS — O9989 Other specified diseases and conditions complicating pregnancy, childbirth and the puerperium: Secondary | ICD-10-CM | POA: Insufficient documentation

## 2014-12-26 DIAGNOSIS — N949 Unspecified condition associated with female genital organs and menstrual cycle: Secondary | ICD-10-CM

## 2014-12-26 DIAGNOSIS — O99332 Smoking (tobacco) complicating pregnancy, second trimester: Secondary | ICD-10-CM | POA: Diagnosis not present

## 2014-12-26 DIAGNOSIS — Z87440 Personal history of urinary (tract) infections: Secondary | ICD-10-CM | POA: Insufficient documentation

## 2014-12-26 DIAGNOSIS — Z8711 Personal history of peptic ulcer disease: Secondary | ICD-10-CM | POA: Diagnosis not present

## 2014-12-26 DIAGNOSIS — O99612 Diseases of the digestive system complicating pregnancy, second trimester: Secondary | ICD-10-CM | POA: Insufficient documentation

## 2014-12-26 DIAGNOSIS — F17210 Nicotine dependence, cigarettes, uncomplicated: Secondary | ICD-10-CM | POA: Insufficient documentation

## 2014-12-26 DIAGNOSIS — K219 Gastro-esophageal reflux disease without esophagitis: Secondary | ICD-10-CM | POA: Diagnosis not present

## 2014-12-26 DIAGNOSIS — R1031 Right lower quadrant pain: Secondary | ICD-10-CM | POA: Diagnosis not present

## 2014-12-26 DIAGNOSIS — R1032 Left lower quadrant pain: Secondary | ICD-10-CM | POA: Insufficient documentation

## 2014-12-26 LAB — URINALYSIS, ROUTINE W REFLEX MICROSCOPIC
Bilirubin Urine: NEGATIVE
GLUCOSE, UA: NEGATIVE mg/dL
KETONES UR: NEGATIVE mg/dL
Leukocytes, UA: NEGATIVE
Nitrite: NEGATIVE
Protein, ur: NEGATIVE mg/dL
Specific Gravity, Urine: 1.01 (ref 1.005–1.030)
UROBILINOGEN UA: 0.2 mg/dL (ref 0.0–1.0)
pH: 6 (ref 5.0–8.0)

## 2014-12-26 LAB — RAPID URINE DRUG SCREEN, HOSP PERFORMED
AMPHETAMINES: NOT DETECTED
BARBITURATES: NOT DETECTED
Benzodiazepines: NOT DETECTED
Cocaine: NOT DETECTED
Opiates: NOT DETECTED
Tetrahydrocannabinol: NOT DETECTED

## 2014-12-26 LAB — CBC WITH DIFFERENTIAL/PLATELET
BASOS ABS: 0 10*3/uL (ref 0.0–0.1)
Basophils Relative: 0 % (ref 0–1)
EOS ABS: 0.2 10*3/uL (ref 0.0–0.7)
EOS PCT: 2 % (ref 0–5)
HEMATOCRIT: 34.7 % — AB (ref 36.0–46.0)
HEMOGLOBIN: 11.9 g/dL — AB (ref 12.0–15.0)
LYMPHS ABS: 2.5 10*3/uL (ref 0.7–4.0)
LYMPHS PCT: 23 % (ref 12–46)
MCH: 30 pg (ref 26.0–34.0)
MCHC: 34.3 g/dL (ref 30.0–36.0)
MCV: 87.4 fL (ref 78.0–100.0)
MONO ABS: 0.7 10*3/uL (ref 0.1–1.0)
MONOS PCT: 7 % (ref 3–12)
NEUTROS ABS: 7.6 10*3/uL (ref 1.7–7.7)
NEUTROS PCT: 69 % (ref 43–77)
Platelets: 199 10*3/uL (ref 150–400)
RBC: 3.97 MIL/uL (ref 3.87–5.11)
RDW: 13.8 % (ref 11.5–15.5)
WBC: 11 10*3/uL — AB (ref 4.0–10.5)

## 2014-12-26 LAB — URINE MICROSCOPIC-ADD ON

## 2014-12-26 MED ORDER — HYDROXYZINE HCL 50 MG/ML IM SOLN
50.0000 mg | Freq: Once | INTRAMUSCULAR | Status: AC
  Administered 2014-12-26: 50 mg via INTRAMUSCULAR
  Filled 2014-12-26: qty 1

## 2014-12-26 MED ORDER — PROMETHAZINE HCL 25 MG/ML IJ SOLN
25.0000 mg | INTRAMUSCULAR | Status: AC
  Administered 2014-12-26: 25 mg via INTRAMUSCULAR
  Filled 2014-12-26: qty 1

## 2014-12-26 MED ORDER — DIPHENHYDRAMINE HCL 25 MG PO CAPS
50.0000 mg | ORAL_CAPSULE | Freq: Once | ORAL | Status: DC
  Filled 2014-12-26: qty 2

## 2014-12-26 MED ORDER — DIPHENHYDRAMINE HCL 50 MG/ML IJ SOLN
50.0000 mg | Freq: Once | INTRAMUSCULAR | Status: AC
  Administered 2014-12-26: 50 mg via INTRAVENOUS
  Filled 2014-12-26: qty 1

## 2014-12-26 MED ORDER — NALBUPHINE HCL 10 MG/ML IJ SOLN
10.0000 mg | INTRAMUSCULAR | Status: AC
  Administered 2014-12-26: 10 mg via INTRAMUSCULAR
  Filled 2014-12-26: qty 1

## 2014-12-26 MED ORDER — ACETAMINOPHEN 500 MG PO TABS
1000.0000 mg | ORAL_TABLET | Freq: Once | ORAL | Status: AC
  Administered 2014-12-26: 1000 mg via ORAL
  Filled 2014-12-26: qty 2

## 2014-12-26 NOTE — Progress Notes (Signed)
Report called to resident re: [place] results final; Pt requesting Tylenol for pain. Orders received for Tylenol.

## 2014-12-26 NOTE — MAU Provider Note (Signed)
MAU Provider Note History     CSN: [id number]  Arrival date and time: 12/26/14 1454   First Provider Initiated Contact with Patient 12/26/14 1534     Chief Complaint  Patient presents with  . Abdominal Pain   HPI  Patient is a [id number] at 22.4 wks in with c/o chronic intermittent low abd pain bilat in nature. Hx of ulcers, heroin addiction, and UTI's. She reports that pain is right sided only, comes and goes, similar to previous pains, associated with nausea with vomiting. Tolerating PO, voiding well. - Denies LOF, VB, contractions, hematuria, dysuria, HA, vision changes   OB History    Gravida Para Term Preterm AB TAB SAB Ectopic Multiple Living   5 2 2  1  1   3       Past Medical History  Diagnosis Date  . Drug addict   . Heroin abuse   . Anxiety   . GERD (gastroesophageal reflux disease)   . History of stomach ulcers   . 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 Chronic lower back pain   . Chronic kidney disease   . Pyelonephritis   . Hypothyroidism     Past Surgical History  Procedure Laterality Date  . Cholecystectomy    . Dilation and curettage of uterus      "I've had 3" (01/26/2013)  . Multiple tooth extractions      "I've had all my teeth removed" (01/26/2013)  . Ureteral stent placement      Had removed later  . Dilation and curettage of uterus      Family History  Problem Relation Age of Onset  . Diabetes Mother   . Cancer Mother   . Diabetes Father   . Cancer Father     History  Substance Use Topics  . Smoking status: Current Some Day Smoker -- 1.50 packs/day for 12 years    Types: Cigarettes  . Smokeless tobacco: Never Used  . Alcohol Use: Yes     Comment: one beer per week    Allergies:  Allergies  Allergen Reactions  . Adhesive [Tape] Other (See Comments)    Pt states this causes redness, burning, and for her skin to tear.   . Reglan [Metoclopramide] Other (See Comments)    Pt states that this medication causes her body  to twitch.   Deirdre Peer. Zofran [Ondansetron Hcl] Other (See Comments)    Pt states that this medication causes her body to twitch.     Prescriptions prior to admission  Medication Sig Dispense Refill Last Dose  . methadone (DOLOPHINE) 10 MG/5ML solution Take 60 mg by mouth daily.   04/12/2014 at Unknown time  . sulfamethoxazole-trimethoprim (BACTRIM DS) 800-160 MG per tablet Take 1 tablet by mouth daily.   Past Week at Unknown time    Review of Systems  Constitutional: Negative.   Eyes: Negative.   Respiratory: Negative.   Cardiovascular: Negative.   Gastrointestinal: Positive for nausea, vomiting and abdominal pain.  Genitourinary: Negative.   Musculoskeletal: Negative.   Skin: Negative.   Neurological: Negative.   Endo/Heme/Allergies: Negative.   Psychiatric/Behavioral: Negative.    Physical Exam   Blood pressure 111/79, pulse 97, temperature 98.3 F (36.8 C), temperature source Oral, resp. rate 18, last menstrual period 07/21/2014, SpO2 100 %.  Physical Exam  Constitutional: She is oriented to person, place, and time. She appears well-developed and well-nourished.  HENT:  Head: Normocephalic.  Eyes: Pupils are equal, round, and reactive to  light.  Neck: Normal range of motion.  Cardiovascular: Normal rate, regular rhythm, normal heart sounds and intact distal pulses.   Respiratory: Effort normal and breath sounds normal.  GI: Soft. Bowel sounds are normal.  Genitourinary: Vagina normal and uterus normal.  Musculoskeletal: Normal range of motion.  Neurological: She is alert and oriented to person, place, and time. She has normal reflexes.  Skin: Skin is warm and dry.  Psychiatric: She has a normal mood and affect. Her behavior is normal. Judgment and thought content normal.    MAU Course  Procedures  MDM Chronic low abd pain, stable maternal fetal unit. SVE cl/th/post/high.  Assessment and Plan  Concern for possible nephrolithiasis, given large hgb and RBCs on UA, no  evidence of UTI. Ordered Renal US, given Nubain for pain to have scan done but patient seemed to have reaction with "twitching" in radiology (similar reaction to other medicines in the past), improved with Vistaril. Returned to MAU, had bedside renal US, demonstrated R-moderate hydronephrosis but no identified stones or obstruction.  UPDATE 2115 Patient received Tylenol  with improvement, now resting and reviewed [place] results. Agreeable to plan to discharge home.  Discharge to home with reassurance. Return criteria given Follow-up for routine prenatal as scheduled, otherwise sooner if persistent symptoms  Saralyn Pilar, DO [facility], PGY-2  12/26/2014, 9:18 PM

## 2014-12-26 NOTE — Progress Notes (Signed)
Notified of pt's reaction to Nubain, orders obtained for vistaril 50mg  IM

## 2014-12-26 NOTE — Progress Notes (Signed)
Notified of pt's u/a results, nausea somewhat relieved, ok to give crackers. Awaiting labs

## 2014-12-26 NOTE — MAU Note (Signed)
Pt here via EMS for lower abd pain on r side only and nausea. States has been having pain on and off and thinks it may be round ligament pain because pt had baby 10 months ago.

## 2014-12-26 NOTE — MAU Note (Signed)
RN remains at bedside  

## 2014-12-26 NOTE — Discharge Instructions (Signed)
Your pain is most likely related to pregnancy and could be ligament pain. Your urine test did show a significant amount of blood, which is suggestive of a possible kidney stone However, your Kidney Ultrasound did not show any stones or obstruction. There was some moderate swelling of your Right Kidney, which can be normal in pregnancy. You may continue taking Tylenol  (2 of the  extra str tabs) every 6 to 8 hours (no more than 8 tabs in 24 hours). Also, recommend rest and heating pad. Stay well hydrated and drink plenty of water.  Important to follow-up with your OB provider as scheduled. Return if develop fevers/chills, nausea / vomiting, decreased urination or bloody urine.

## 2015-01-27 ENCOUNTER — Encounter (HOSPITAL_COMMUNITY): Payer: Self-pay | Admitting: *Deleted

## 2015-02-08 ENCOUNTER — Encounter: Payer: Medicaid Other | Admitting: Obstetrics & Gynecology

## 2015-02-23 ENCOUNTER — Encounter: Payer: Medicaid Other | Admitting: Family Medicine

## 2015-04-01 ENCOUNTER — Encounter (HOSPITAL_COMMUNITY): Payer: Self-pay | Admitting: *Deleted

## 2015-04-01 ENCOUNTER — Inpatient Hospital Stay (HOSPITAL_COMMUNITY)
Admission: AD | Admit: 2015-04-01 | Discharge: 2015-04-01 | Disposition: A | Discharge status: Home / Self Care | Payer: Medicaid Other | Source: Ambulatory Visit | Attending: Obstetrics and Gynecology | Admitting: Obstetrics and Gynecology

## 2015-04-01 DIAGNOSIS — F17210 Nicotine dependence, cigarettes, uncomplicated: Secondary | ICD-10-CM | POA: Insufficient documentation

## 2015-04-01 DIAGNOSIS — O0933 Supervision of pregnancy with insufficient antenatal care, third trimester: Secondary | ICD-10-CM

## 2015-04-01 DIAGNOSIS — O99333 Smoking (tobacco) complicating pregnancy, third trimester: Secondary | ICD-10-CM | POA: Insufficient documentation

## 2015-04-01 DIAGNOSIS — O99323 Drug use complicating pregnancy, third trimester: Secondary | ICD-10-CM | POA: Diagnosis not present

## 2015-04-01 DIAGNOSIS — Z833 Family history of diabetes mellitus: Secondary | ICD-10-CM | POA: Insufficient documentation

## 2015-04-01 DIAGNOSIS — O99320 Drug use complicating pregnancy, unspecified trimester: Secondary | ICD-10-CM

## 2015-04-01 DIAGNOSIS — N760 Acute vaginitis: Secondary | ICD-10-CM

## 2015-04-01 DIAGNOSIS — R42 Dizziness and giddiness: Secondary | ICD-10-CM | POA: Diagnosis not present

## 2015-04-01 DIAGNOSIS — F1110 Opioid abuse, uncomplicated: Secondary | ICD-10-CM | POA: Diagnosis not present

## 2015-04-01 DIAGNOSIS — O99820 Streptococcus B carrier state complicating pregnancy: Secondary | ICD-10-CM

## 2015-04-01 DIAGNOSIS — Z3A38 38 weeks gestation of pregnancy: Secondary | ICD-10-CM | POA: Diagnosis not present

## 2015-04-01 DIAGNOSIS — O9989 Other specified diseases and conditions complicating pregnancy, childbirth and the puerperium: Secondary | ICD-10-CM | POA: Diagnosis not present

## 2015-04-01 DIAGNOSIS — R1084 Generalized abdominal pain: Secondary | ICD-10-CM

## 2015-04-01 DIAGNOSIS — B9689 Other specified bacterial agents as the cause of diseases classified elsewhere: Secondary | ICD-10-CM

## 2015-04-01 DIAGNOSIS — R109 Unspecified abdominal pain: Secondary | ICD-10-CM | POA: Diagnosis present

## 2015-04-01 DIAGNOSIS — L299 Pruritus, unspecified: Secondary | ICD-10-CM | POA: Insufficient documentation

## 2015-04-01 DIAGNOSIS — F1120 Opioid dependence, uncomplicated: Secondary | ICD-10-CM

## 2015-04-01 DIAGNOSIS — O0930 Supervision of pregnancy with insufficient antenatal care, unspecified trimester: Secondary | ICD-10-CM

## 2015-04-01 DIAGNOSIS — O23593 Infection of other part of genital tract in pregnancy, third trimester: Secondary | ICD-10-CM | POA: Diagnosis not present

## 2015-04-01 DIAGNOSIS — E860 Dehydration: Secondary | ICD-10-CM

## 2015-04-01 DIAGNOSIS — A499 Bacterial infection, unspecified: Secondary | ICD-10-CM

## 2015-04-01 DIAGNOSIS — Z3483 Encounter for supervision of other normal pregnancy, third trimester: Secondary | ICD-10-CM

## 2015-04-01 LAB — CBC
HCT: 32.3 % — ABNORMAL LOW (ref 36.0–46.0)
Hemoglobin: 10.4 g/dL — ABNORMAL LOW (ref 12.0–15.0)
MCH: 25.7 pg — ABNORMAL LOW (ref 26.0–34.0)
MCHC: 32.2 g/dL (ref 30.0–36.0)
MCV: 79.8 fL (ref 78.0–100.0)
Platelets: 124 10*3/uL — ABNORMAL LOW (ref 150–400)
RBC: 4.05 MIL/uL (ref 3.87–5.11)
RDW: 15.4 % (ref 11.5–15.5)
WBC: 9.9 10*3/uL (ref 4.0–10.5)

## 2015-04-01 LAB — OB RESULTS CONSOLE HIV ANTIBODY (ROUTINE TESTING): HIV: NONREACTIVE

## 2015-04-01 LAB — URINE MICROSCOPIC-ADD ON

## 2015-04-01 LAB — DIFFERENTIAL
BASOS PCT: 0 % (ref 0–1)
Basophils Absolute: 0 10*3/uL (ref 0.0–0.1)
Eosinophils Absolute: 0.1 10*3/uL (ref 0.0–0.7)
Eosinophils Relative: 1 % (ref 0–5)
LYMPHS PCT: 20 % (ref 12–46)
Lymphs Abs: 2 10*3/uL (ref 0.7–4.0)
Monocytes Absolute: 0.5 10*3/uL (ref 0.1–1.0)
Monocytes Relative: 5 % (ref 3–12)
NEUTROS PCT: 74 % (ref 43–77)
Neutro Abs: 7.4 10*3/uL (ref 1.7–7.7)

## 2015-04-01 LAB — URINALYSIS, ROUTINE W REFLEX MICROSCOPIC
Bilirubin Urine: NEGATIVE
Glucose, UA: NEGATIVE mg/dL
Hgb urine dipstick: NEGATIVE
Ketones, ur: 15 mg/dL — AB
NITRITE: POSITIVE — AB
PH: 6.5 (ref 5.0–8.0)
Protein, ur: NEGATIVE mg/dL
SPECIFIC GRAVITY, URINE: 1.02 (ref 1.005–1.030)
Urobilinogen, UA: 1 mg/dL (ref 0.0–1.0)

## 2015-04-01 LAB — OB RESULTS CONSOLE GC/CHLAMYDIA: Gonorrhea: NEGATIVE

## 2015-04-01 LAB — HEPATITIS B SURFACE ANTIGEN: HEP B S AG: NEGATIVE

## 2015-04-01 LAB — WET PREP, GENITAL
TRICH WET PREP: NONE SEEN
YEAST WET PREP: NONE SEEN

## 2015-04-01 LAB — OB RESULTS CONSOLE GBS: STREP GROUP B AG: POSITIVE

## 2015-04-01 MED ORDER — METHADONE HCL 10 MG PO TABS
80.0000 mg | ORAL_TABLET | Freq: Once | ORAL | Status: AC
  Administered 2015-04-01: 80 mg via ORAL
  Filled 2015-04-01: qty 8

## 2015-04-01 MED ORDER — METRONIDAZOLE 500 MG PO TABS: 500.0000 mg | ORAL_TABLET | Freq: Two times a day (BID) | ORAL | Status: DC

## 2015-04-01 MED ORDER — LACTATED RINGERS IV BOLUS (SEPSIS)
500.0000 mL | Freq: Once | INTRAVENOUS | Status: AC
  Administered 2015-04-01: 500 mL via INTRAVENOUS

## 2015-04-01 MED ORDER — METRONIDAZOLE 500 MG PO TABS
500.0000 mg | ORAL_TABLET | Freq: Once | ORAL | Status: DC
  Filled 2015-04-01: qty 1

## 2015-04-01 NOTE — Discharge Instructions (Signed)
Third Trimester of Pregnancy The third trimester is from week 29 through week 42, months 7 through 9. The third trimester is a time when the fetus is growing rapidly. At the end of the ninth month, the fetus is about 20 inches in length and weighs 6-10 pounds.  BODY CHANGES Your body goes through many changes during pregnancy. The changes vary from woman to woman.   Your weight will continue to increase. You can expect to gain 25-35 pounds (11-16 kg) by the end of the pregnancy.  You may begin to get stretch marks on your hips, abdomen, and breasts.  You may urinate more often because the fetus is moving lower into your pelvis and pressing on your bladder.  You may develop or continue to have heartburn as a result of your pregnancy.  You may develop constipation because certain hormones are causing the muscles that push waste through your intestines to slow down.  You may develop hemorrhoids or swollen, bulging veins (varicose veins).  You may have pelvic pain because of the weight gain and pregnancy hormones relaxing your joints between the bones in your pelvis. Backaches may result from overexertion of the muscles supporting your posture.  You may have changes in your hair. These can include thickening of your hair, rapid growth, and changes in texture. Some women also have hair loss during or after pregnancy, or hair that feels dry or thin. Your hair will most likely return to normal after your baby is born.  Your breasts will continue to grow and be tender. A yellow discharge may leak from your breasts called colostrum.  Your belly button may stick out.  You may feel short of breath because of your expanding uterus.  You may notice the fetus "dropping," or moving lower in your abdomen.  You may have a bloody mucus discharge. This usually occurs a few days to a week before labor begins.  Your cervix becomes thin and soft (effaced) near your due date. WHAT TO EXPECT AT YOUR PRENATAL  EXAMS  You will have prenatal exams every 2 weeks until week 36. Then, you will have weekly prenatal exams. During a routine prenatal visit:  You will be weighed to make sure you and the fetus are growing normally.  Your blood pressure is taken.  Your abdomen will be measured to track your baby's growth.  The fetal heartbeat will be listened to.  Any test results from the previous visit will be discussed.  You may have a cervical check near your due date to see if you have effaced. At around 36 weeks, your caregiver will check your cervix. At the same time, your caregiver will also perform a test on the secretions of the vaginal tissue. This test is to determine if a type of bacteria, Group B streptococcus, is present. Your caregiver will explain this further. Your caregiver may ask you:  What your birth plan is.  How you are feeling.  If you are feeling the baby move.  If you have had any abnormal symptoms, such as leaking fluid, bleeding, severe headaches, or abdominal cramping.  If you have any questions. Other tests or screenings that may be performed during your third trimester include:  Blood tests that check for low iron levels (anemia).  Fetal testing to check the health, activity level, and growth of the fetus. Testing is done if you have certain medical conditions or if there are problems during the pregnancy. FALSE LABOR You may feel small, irregular contractions that   eventually go away. These are called Braxton Hicks contractions, or false labor. Contractions may last for hours, days, or even weeks before true labor sets in. If contractions come at regular intervals, intensify, or become painful, it is best to be seen by your caregiver.  SIGNS OF LABOR   Menstrual-like cramps.  Contractions that are 5 minutes apart or less.  Contractions that start on the top of the uterus and spread down to the lower abdomen and back.  A sense of increased pelvic pressure or back  pain.  A watery or bloody mucus discharge that comes from the vagina. If you have any of these signs before the 37th week of pregnancy, call your caregiver right away. You need to go to the hospital to get checked immediately. HOME CARE INSTRUCTIONS   Avoid all smoking, herbs, alcohol, and unprescribed drugs. These chemicals affect the formation and growth of the baby.  Follow your caregiver's instructions regarding medicine use. There are medicines that are either safe or unsafe to take during pregnancy.  Exercise only as directed by your caregiver. Experiencing uterine cramps is a good sign to stop exercising.  Continue to eat regular, healthy meals.  Wear a good support bra for breast tenderness.  Do not use hot tubs, steam rooms, or saunas.  Wear your seat belt at all times when driving.  Avoid raw meat, uncooked cheese, cat litter boxes, and soil used by cats. These carry germs that can cause birth defects in the baby.  Take your prenatal vitamins.  Try taking a stool softener (if your caregiver approves) if you develop constipation. Eat more high-fiber foods, such as fresh vegetables or fruit and whole grains. Drink plenty of fluids to keep your urine clear or pale yellow.  Take warm sitz baths to soothe any pain or discomfort caused by hemorrhoids. Use hemorrhoid cream if your caregiver approves.  If you develop varicose veins, wear support hose. Elevate your feet for 15 minutes, 3-4 times a day. Limit salt in your diet.  Avoid heavy lifting, wear low heal shoes, and practice good posture.  Rest a lot with your legs elevated if you have leg cramps or low back pain.  Visit your dentist if you have not gone during your pregnancy. Use a soft toothbrush to brush your teeth and be gentle when you floss.  A sexual relationship may be continued unless your caregiver directs you otherwise.  Do not travel far distances unless it is absolutely necessary and only with the approval  of your caregiver.  Take prenatal classes to understand, practice, and ask questions about the labor and delivery.  Make a trial run to the hospital.  Pack your hospital bag.  Prepare the baby's nursery.  Continue to go to all your prenatal visits as directed by your caregiver. SEEK MEDICAL CARE IF:  You are unsure if you are in labor or if your water has broken.  You have dizziness.  You have mild pelvic cramps, pelvic pressure, or nagging pain in your abdominal area.  You have persistent nausea, vomiting, or diarrhea.  You have a bad smelling vaginal discharge.  You have pain with urination. SEEK IMMEDIATE MEDICAL CARE IF:   You have a fever.  You are leaking fluid from your vagina.  You have spotting or bleeding from your vagina.  You have severe abdominal cramping or pain.  You have rapid weight loss or gain.  You have shortness of breath with chest pain.  You notice sudden or extreme swelling   of your face, hands, ankles, feet, or legs.  You have not felt your baby move in over an hour.  You have severe headaches that do not go away with medicine.  You have vision changes. Document Released: 07/17/2001 Document Revised: 07/28/2013 Document Reviewed: 09/23/2012 ExitCare Patient Information 2015 [employer]. This information is not intended to replace advice given to you by your health care provider. Make sure you discuss any questions you have with your health care provider.  

## 2015-04-01 NOTE — MAU Note (Signed)
Constantly asking for her Methadone;

## 2015-04-01 NOTE — MAU Note (Signed)
C/o dizziness and cramping and vaginal pressure that started around 1015 this AM; missed her Methadone dose today;

## 2015-04-01 NOTE — MAU Provider Note (Signed)
History     CSN: [id number]  Arrival date and time: 04/01/15 1158   First Provider Initiated Contact with Patient 04/01/15 1303      Chief Complaint  Patient presents with  . Dizziness  . Abdominal Pain   HPI  Patient is 29 y.o. [id number] [redacted]w[redacted]d here with complaints of abdominal pain and dizziness. She reports she  Started feeling dizzy while on the bus  to go to her methadone clinic. She typically takes methadone at 0900.   She reports abdominal pain, diffuse in nature, non-radiating, no aggravating or alleviating sx.  She reports she is chronically dehydrated. Reports low water intake. Always throwing up- this has been happening throughout pregnancy. Reports contractions without pain that are happening "frequently" but has not timed them.  Reports vaginal itching for the past several weeks. Denies dysuria/polyuria.   Social complexities: Has not started prenatal care or received any care this pregnancy.  FOB new girlfriend accompanies today.Gaylyn Rong (friend): [phone] Currently housing insecure On methadone- for pain and addiction. Last used heroin in Feb 2015.  Last delivery was in Sept 2015  +FM, denies LOF, VB, contractions, vaginal discharge.   OB History    Gravida Para Term Preterm AB TAB SAB Ectopic Multiple Living   6 3 3  1  1   3       Past Medical History  Diagnosis Date  . Drug addict   . Heroin abuse   . Anxiety   . GERD (gastroesophageal reflux disease)   . History of stomach ulcers   . 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 Chronic lower back pain   . Chronic kidney disease   . Pyelonephritis   . Hypothyroidism     Past Surgical History  Procedure Laterality Date  . Cholecystectomy    . Dilation and curettage of uterus      "I've had 3" (01/26/2013)  . Multiple tooth extractions      "I've had all my teeth removed" (01/26/2013)  . Ureteral stent placement      Had removed later  . Dilation and curettage of uterus      Family History   Problem Relation Age of Onset  . Diabetes Mother   . Cancer Mother   . Diabetes Father   . Cancer Father     Social History  Substance Use Topics  . Smoking status: Current Some Day Smoker -- 1.50 packs/day for 12 years    Types: Cigarettes  . Smokeless tobacco: Never Used  . Alcohol Use: Yes     Comment: one beer per week    Allergies:  Allergies  Allergen Reactions  . Nubain [Nalbuphine] Other (See Comments)    Anxiety, severe twitching  . Adhesive [Tape] Other (See Comments)    Reaction:  Redness, burning, and skin tears   . Reglan [Metoclopramide] Other (See Comments)    Reaction:  Twitching   . Zofran [Ondansetron Hcl] Other (See Comments)    Reaction:  Twitching     No prescriptions prior to admission    Review of Systems  Constitutional: Negative for fever and chills.  Eyes: Negative for blurred vision and double vision.  Respiratory: Negative for cough and shortness of breath.   Cardiovascular: Negative for chest pain and orthopnea.  Gastrointestinal: Positive for abdominal pain. Negative for nausea and vomiting.  Genitourinary: Negative for dysuria, frequency and flank pain.  Musculoskeletal: Negative for myalgias.  Skin: Negative for rash.  Neurological: Positive for dizziness. Negative  for tingling, weakness and headaches.  Endo/Heme/Allergies: Does not bruise/bleed easily.  Psychiatric/Behavioral: Negative for depression and suicidal ideas. The patient is not nervous/anxious.    Physical Exam   Blood pressure 116/77, pulse 84, temperature 96.6 F (35.9 C), temperature source Axillary, resp. rate 16, last menstrual period 07/21/2014, unknown if currently breastfeeding.  Physical Exam  Nursing note and vitals reviewed. Constitutional: She is oriented to person, place, and time. She appears well-developed and well-nourished. No distress.  Pregnant female  HENT:  Head: Normocephalic and atraumatic.  Edentulous. Dry oral pharynx   Eyes: Conjunctivae  are normal. No scleral icterus.  Neck: Normal range of motion. Neck supple.  Cardiovascular: Normal rate and intact distal pulses.   Respiratory: Effort normal. She exhibits no tenderness.  GI: Soft. There is no tenderness. There is no rebound and no guarding.  Gravid  Genitourinary: Vagina normal.  White frothy discharge  Musculoskeletal: Normal range of motion. She exhibits no edema.  Neurological: She is alert and oriented to person, place, and time.  Skin: Skin is warm and dry. No rash noted.  Psychiatric: She has a normal mood and affect.    MAU Course  Procedures  MDM-complex with multiple acute complaints including vaginitis, abdominal pain, dizziness-- necessitated labs and other testing as well as medication prescription. I reviewed her old records which showed that prior pregnancy was complicated by active heroin abuse, pyelonephritis. Reviewed [place] reports from this pregnancy including a renal US which was normal. Reviewed MAU note from May 2016 where patient was seen for abdominal pain.   Routine Prenatal labs- CBC, Type and Screen, Hep B antigen, HV GC/CT-collected Wet Prep- Few clue cells, no trich.  UA- showed +nitrites IVF- 500 LR bolus  Assessment and Plan  Patient is 29 y.o. [id number] [redacted]w[redacted]d here with complaints of abdominal pain, dizziness, vaginal itching and missing methadone dose.   #Abdominal pain: benign exam. ddx includes braxton hicks, abdominal stretch. Unlikely UTI related given not suprapubic or CVA related. Possibly related to dehydration - 500 LR bolus, patient had PIV placed by EMS    #Vaginitis: Likely related to BV. Gave one dose of Metronidazole and gave Rx for remaining treatment  #Possible UTI: no sx and will await urine culture for species in order to guide treatment.   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 She denies use of other substance. Patient was provided with 1 dose of methadone in MAU and  instructed that this is not an appropriate place to receive her methadone and she will need to go to her methadone clinic tomorrow for her doses.   #Routine PNC: Patient has not had any PNC this pregnancy.  -Routine labs were drawn -collected GBS and GC/CT.   -I sent a message to Mayra Neer at [facility] regarding getting patient into prenatal care.  -Did not order [place] as patient is relatively sure of her LMP.   #Social Complexity- Patient will need social work involvement, especially postpartum.   Isa Rankin [facility] 04/01/2015, 8:59 PM

## 2015-04-02 ENCOUNTER — Encounter (HOSPITAL_COMMUNITY): Payer: Self-pay | Admitting: *Deleted

## 2015-04-02 ENCOUNTER — Inpatient Hospital Stay (HOSPITAL_COMMUNITY)
Admission: AD | Admit: 2015-04-02 | Discharge: 2015-04-02 | Disposition: A | Discharge status: Home / Self Care | Payer: Medicaid Other | Source: Ambulatory Visit | Attending: Obstetrics and Gynecology | Admitting: Obstetrics and Gynecology

## 2015-04-02 DIAGNOSIS — E039 Hypothyroidism, unspecified: Secondary | ICD-10-CM | POA: Insufficient documentation

## 2015-04-02 DIAGNOSIS — R569 Unspecified convulsions: Secondary | ICD-10-CM

## 2015-04-02 DIAGNOSIS — K219 Gastro-esophageal reflux disease without esophagitis: Secondary | ICD-10-CM | POA: Insufficient documentation

## 2015-04-02 DIAGNOSIS — N3000 Acute cystitis without hematuria: Secondary | ICD-10-CM

## 2015-04-02 DIAGNOSIS — F17210 Nicotine dependence, cigarettes, uncomplicated: Secondary | ICD-10-CM | POA: Insufficient documentation

## 2015-04-02 DIAGNOSIS — R462 Strange and inexplicable behavior: Secondary | ICD-10-CM | POA: Diagnosis not present

## 2015-04-02 DIAGNOSIS — F1120 Opioid dependence, uncomplicated: Secondary | ICD-10-CM | POA: Diagnosis not present

## 2015-04-02 DIAGNOSIS — O99323 Drug use complicating pregnancy, third trimester: Secondary | ICD-10-CM

## 2015-04-02 DIAGNOSIS — R109 Unspecified abdominal pain: Secondary | ICD-10-CM | POA: Diagnosis present

## 2015-04-02 DIAGNOSIS — F419 Anxiety disorder, unspecified: Secondary | ICD-10-CM | POA: Insufficient documentation

## 2015-04-02 DIAGNOSIS — Z3483 Encounter for supervision of other normal pregnancy, third trimester: Secondary | ICD-10-CM

## 2015-04-02 DIAGNOSIS — O2343 Unspecified infection of urinary tract in pregnancy, third trimester: Secondary | ICD-10-CM

## 2015-04-02 DIAGNOSIS — N390 Urinary tract infection, site not specified: Secondary | ICD-10-CM | POA: Insufficient documentation

## 2015-04-02 DIAGNOSIS — Z3A38 38 weeks gestation of pregnancy: Secondary | ICD-10-CM | POA: Diagnosis not present

## 2015-04-02 DIAGNOSIS — F1110 Opioid abuse, uncomplicated: Secondary | ICD-10-CM | POA: Diagnosis not present

## 2015-04-02 DIAGNOSIS — Z91410 Personal history of adult physical and sexual abuse: Secondary | ICD-10-CM

## 2015-04-02 DIAGNOSIS — O99320 Drug use complicating pregnancy, unspecified trimester: Secondary | ICD-10-CM

## 2015-04-02 DIAGNOSIS — O0933 Supervision of pregnancy with insufficient antenatal care, third trimester: Secondary | ICD-10-CM

## 2015-04-02 LAB — URINALYSIS, ROUTINE W REFLEX MICROSCOPIC
Bilirubin Urine: NEGATIVE
GLUCOSE, UA: NEGATIVE mg/dL
Ketones, ur: NEGATIVE mg/dL
NITRITE: POSITIVE — AB
PH: 5.5 (ref 5.0–8.0)
Protein, ur: NEGATIVE mg/dL
Specific Gravity, Urine: >1.03 — ABNORMAL HIGH (ref 1.005–1.030)
Urobilinogen, UA: 1 mg/dL (ref 0.0–1.0)

## 2015-04-02 LAB — URINE MICROSCOPIC-ADD ON

## 2015-04-02 LAB — HIV ANTIBODY (ROUTINE TESTING W REFLEX): HIV SCREEN 4TH GENERATION: NONREACTIVE

## 2015-04-02 LAB — COMPREHENSIVE METABOLIC PANEL
ALT: 23 U/L (ref 14–54)
ANION GAP: 12 (ref 5–15)
AST: 27 U/L (ref 15–41)
Albumin: 2.7 g/dL — ABNORMAL LOW (ref 3.5–5.0)
Alkaline Phosphatase: 281 U/L — ABNORMAL HIGH (ref 38–126)
BUN: 10 mg/dL (ref 6–20)
CHLORIDE: 100 mmol/L — AB (ref 101–111)
CO2: 22 mmol/L (ref 22–32)
CREATININE: 0.73 mg/dL (ref 0.44–1.00)
Calcium: 9.2 mg/dL (ref 8.9–10.3)
Glucose, Bld: 81 mg/dL (ref 65–99)
POTASSIUM: 4.1 mmol/L (ref 3.5–5.1)
SODIUM: 134 mmol/L — AB (ref 135–145)
Total Bilirubin: 0.5 mg/dL (ref 0.3–1.2)
Total Protein: 7.1 g/dL (ref 6.5–8.1)

## 2015-04-02 LAB — CULTURE, BETA STREP (GROUP B ONLY)

## 2015-04-02 LAB — RAPID URINE DRUG SCREEN, HOSP PERFORMED
Amphetamines: NOT DETECTED
BENZODIAZEPINES: NOT DETECTED
Barbiturates: NOT DETECTED
COCAINE: NOT DETECTED
OPIATES: NOT DETECTED
TETRAHYDROCANNABINOL: NOT DETECTED

## 2015-04-02 LAB — CBC
HCT: 31.5 % — ABNORMAL LOW (ref 36.0–46.0)
Hemoglobin: 10.2 g/dL — ABNORMAL LOW (ref 12.0–15.0)
MCH: 25.6 pg — AB (ref 26.0–34.0)
MCHC: 32.4 g/dL (ref 30.0–36.0)
MCV: 78.9 fL (ref 78.0–100.0)
Platelets: 172 10*3/uL (ref 150–400)
RBC: 3.99 MIL/uL (ref 3.87–5.11)
RDW: 15.5 % (ref 11.5–15.5)
WBC: 12.8 10*3/uL — ABNORMAL HIGH (ref 4.0–10.5)

## 2015-04-02 LAB — RPR: RPR Ser Ql: NONREACTIVE

## 2015-04-02 LAB — SALICYLATE LEVEL: Salicylate Lvl: <4 mg/dL (ref 2.8–30.0)

## 2015-04-02 LAB — RUBELLA SCREEN: Rubella: 3.28 index (ref >0.99)

## 2015-04-02 LAB — ETHANOL

## 2015-04-02 LAB — ACETAMINOPHEN LEVEL: Acetaminophen (Tylenol), Serum: <10 ug/mL — ABNORMAL LOW (ref 10–30)

## 2015-04-02 MED ORDER — CEPHALEXIN 250 MG PO CAPS
250.0000 mg | ORAL_CAPSULE | Freq: Once | ORAL | Status: AC
  Administered 2015-04-02: 250 mg via ORAL
  Filled 2015-04-02: qty 1

## 2015-04-02 MED ORDER — METRONIDAZOLE 500 MG PO TABS: 500.0000 mg | ORAL_TABLET | Freq: Two times a day (BID) | ORAL | Status: DC

## 2015-04-02 MED ORDER — CEPHALEXIN 250 MG PO CAPS: 500.0000 mg | ORAL_CAPSULE | Freq: Four times a day (QID) | ORAL | Status: DC

## 2015-04-02 MED ORDER — HYDROXYZINE PAMOATE 25 MG PO CAPS: 100.0000 mg | ORAL_CAPSULE | Freq: Three times a day (TID) | ORAL | Status: DC | PRN

## 2015-04-02 NOTE — MAU Note (Addendum)
Patient arrived via EMS from [facility]. Patient called director earlier. Stated she didn't feel well and felt like she needed to go to the hospital. Patient moving about, making distorted facial expressions. Patient on Methadone and received her dose ( ) this AM. Denies using any other substances this AM. Was assisted to BR on arrival per pt request. Requested that patient collect urine sample. Patient sat on toilet for a long time. States she voided, but urine cup was empty. Patient apologized for not collecting urine. Patient assisted to bed. Required assistance due to C/O of dizziness and observed erratic movements.

## 2015-04-02 NOTE — Discharge Instructions (Signed)
Premature Rupture and Preterm Premature Rupture of Membranes °A sac made up of membranes surrounds your baby in the womb (uterus). When this sac breaks before contractions or labor starts, it is called premature rupture of membranes (PROM). Rupture of membranes is also known as your water breaking. If this happens before 37 weeks, it is called preterm premature rupture of membranes (PPROM). PPROM is serious. It needs medical care right away. °CAUSES  °PROM may be caused by the membranes getting weak. This happens at the end of pregnancy. PPROM is often due to an infection, but can be caused by a number of other things.  °SIGNS OF PROM OR PPROM °· A sudden gush of fluid from the vagina. °· A slow leak of fluid from the vagina. °· Your underwear stay wet. °WHAT TO DO IF YOU THINK YOUR WATER BROKE °Call your doctor right away. You will need to go to the hospital to get checked right away. °WHAT HAPPENS IF YOU ARE TOLD YOU HAVE PROM OR PPROM? °You will have tests done at the hospital. If you have PROM, you may be given medicine to start labor (induced). This may happen if you are not having contractions within 24 hours of your water breaking. If you have PPROM and are not having contractions, you may be given medicine to start labor. It will depend on how far along you are in your pregnancy. °If you have PPROM, you: °· And your baby will be watched closely for signs of infection or other problems. °· May be given an antibiotic medicine. This can stop an infection from starting. °· May be given a steroid medicine. This can help the lungs to develop faster. °· May be given a medicine to stop early labor (preterm labor). °· May be told to stay in bed except to use the restroom (bed rest). °· May be given medicine to start labor. This can happen if there are problems with you or the baby. °Your treatment will depend on many factors. °Document Released: 10/19/2008 Document Revised: 03/25/2013 Document Reviewed:  11/11/2012 °ExitCare® Patient Information ©2015 [employer]. This information is not intended to replace advice given to you by your health care provider. Make sure you discuss any questions you have with your health care provider. ° °

## 2015-04-02 NOTE — MAU Provider Note (Signed)
History     CSN: [id number]  Arrival date and time: 04/02/15 1218   First Provider Initiated Contact with Patient 04/02/15 1321      Chief Complaint  Patient presents with  . Abdominal Pain  . Abnormal behavior    HPI  Patient is 29 y.o. [id number] [redacted]w[redacted]d here with complaints of abnormal behavior.  Tammy Winters is accompanied by the director of [facility] (a day center the patient frequents). They report that this morning when Tammy Winters reported to the day center, she was acting very unusual. She had just left her usual methadone clinic. She was barely able to write her name, and began to sway side to side. Her eyes rolled back in her head repeatedly, and she displayed repeated jerking body movements and contorted facial positions. She was also exceedingly anxious, and was still visibly so upon presentation to the MAU. The director was concerned that she was either using drugs or experiencing a seizure, so she brought her to the MAU. She reports that she keeps losing her train of thought mid-sentence, and has noticed that she cannot sit still. She denies recent drug use other than methadone, and requested a UDS to prove this.   Of note, Tammy Winters was also here yesterday. She presented initially with abdominal pain, but then requested methadone as she was unable to make it to her regular methadone clinic yesterday morning.   No pregnancy-related concerns today. +FM, denies LOF, VB, contractions, vaginal discharge.    Past Medical History  Diagnosis Date  . Drug addict   . Heroin abuse   . Anxiety   . GERD (gastroesophageal reflux disease)   . History of stomach ulcers   . 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 Chronic lower back pain   . Pyelonephritis   . Hypothyroidism   . History of stomach ulcers 02/28/2013    Past Surgical History  Procedure Laterality Date  . Cholecystectomy    . Dilation and curettage of uterus      "I've had 3" (01/26/2013)   . Multiple tooth extractions      "I've had all my teeth removed" (01/26/2013)  . Ureteral stent placement      Had removed later  . Dilation and curettage of uterus      Family History  Problem Relation Age of Onset  . Diabetes Mother   . Cancer Mother   . Diabetes Father   . Cancer Father     Social History  Substance Use Topics  . Smoking status: Current Some Day Smoker -- 1.50 packs/day for 12 years    Types: Cigarettes  . Smokeless tobacco: Never Used  . Alcohol Use: Yes     Comment: one beer per week    Allergies:  Allergies  Allergen Reactions  . Nubain [Nalbuphine] Other (See Comments)    Anxiety, severe twitching  . Adhesive [Tape] Other (See Comments)    Reaction:  Redness, burning, and skin tears   . Reglan [Metoclopramide] Other (See Comments)    Reaction:  Twitching   . Zofran [Ondansetron Hcl] Other (See Comments)    Reaction:  Twitching     No prescriptions prior to admission    Review of Systems  Respiratory: Negative for shortness of breath and wheezing.   Cardiovascular: Positive for palpitations. Negative for chest pain and leg swelling.  Gastrointestinal: Positive for abdominal pain. Negative for nausea and vomiting.  Genitourinary: Positive for dysuria.  Neurological: Positive  for dizziness and weakness. Negative for focal weakness, seizures and loss of consciousness.  Psychiatric/Behavioral: Negative for substance abuse. The patient is nervous/anxious.    Physical Exam   Blood pressure 133/90, pulse 104, temperature 99.1 F (37.3 C), temperature source Oral, resp. rate 16, last menstrual period 07/21/2014, SpO2 97 %, unknown if currently breastfeeding.  Physical Exam  Constitutional: She is oriented to person, place, and time. No distress.  Sitting up in bed moving constantly. Dystonic-like facial movements and posturing throughout encounter. Frequently stops mid-sentence and says she lost her train of thought.   Eyes: Conjunctivae and  EOM are normal. Pupils are equal, round, and reactive to light. Right eye exhibits no discharge. Left eye exhibits no discharge.  No nystagmus  Cardiovascular: Regular rhythm.   No murmur heard. Tachycardic  Respiratory: Effort normal and breath sounds normal. No respiratory distress. She has no wheezes.  GI: Bowel sounds are normal. She exhibits no distension. There is no tenderness.  Gravid  Genitourinary:  Cervix 1.5 cm dilated  Neurological: She is alert and oriented to person, place, and time. No cranial nerve deficit.  Skin: No rash noted.  Red scabbed lesions scattered across abdomen (attributes to bed bugs from homeless shelter); no track marks noted    MAU Course  Procedures None  MDM Negative urine drug screen Acetaminophen, salicylate levels WNL Blood ethanol negative  CBC, CMP WNL UA - positive nitrites Urine culture - pending  NST 130/mod/+accels, no decels. Baseline between 120-130s  Assessment and Plan   Patient is 29 y.o. [id number] [redacted]w[redacted]d reporting abnormal behavior.   P. Discharge home. Unlikely due to intoxication given negative drug screen, acetaminophen and salicylate levels, and blood EtOH. 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  - Will refer to outpatient neuro to r/u seizure activity.  - Vistaril for anxiety - Keflex and Flagyl for UTI/urinary symptoms  - Reviewed findings and my conclusion - Term labor precautions dicussed - Handout given - Follow-up with OB provider  Tammy Winters 04/02/2015, 6:59 PM   OB fellow attestation: I have seen and examined this patient; I agree with above documentation in the resident's note.   Tammy Winters is a 29 y.o. [phone] presenting with [facility] director for concern for seizure like activity with staring and lip smacking. +FM, denies LOF, VB, contractions, vaginal discharge.  PE: BP 133/90 mmHg  Pulse 104  Temp(Src) 99.1 F (37.3 C) (Oral)  Resp 16  SpO2 97%  LMP  07/21/2014 Gen: calm comfortable, NAD Resp: normal effort, no distress Abd: gravid  ROS, labs, PMH reviewed NST reactive  Plan: #Seizures like activity: agree with ddx above and story sounds concerning for absence seizures but no increased K or prolactin to support seizures and no observed episodes here that are concerning for status.  UDS negative which is reassuring.  -discussed with resident and patient need for neurology follow up. [facility] director Marcelino Duster will help coordinate this follow up -Reviewed safety precautions.  Gar Gibbon- [facility] [phone]  #UTI: at last visit had suspicious but culture was no sent. Given persistent result and previous hx of pyelonephritis will treat with Keflex. First dose here and then continued for 7 days.  Follow up urine culture  #Anxiety: patient with known anxiety and takes vistiril for this. She would like rx today which i think is reasonable given that anxiety would contribute to pseudoseizure presentation.   #Routine prenatal care: labs were drawn on Friday. Reviewed and no concerning results. I have sent  a message (on Friday to clinic director to help coordinate entry to routine [facility].  - continue routine follow up in OB clinic  Tammy Flake, MD 6:59 PM

## 2015-04-02 NOTE — Progress Notes (Signed)
Patient lucid, ambulating without difficulty. States she feels so much better.

## 2015-04-02 NOTE — Progress Notes (Addendum)
Patient continues to squirm and move about. Staring at ceiling, distorting her face. Having difficulty completing sentences. According to [facility] director, pt is homeless, but has current shelter for 48hrs at a hotel.

## 2015-04-02 NOTE — Progress Notes (Signed)
Patient drowsy and appears to be sleeping.

## 2015-04-03 DIAGNOSIS — O99820 Streptococcus B carrier state complicating pregnancy: Secondary | ICD-10-CM

## 2015-04-03 LAB — TYPE AND SCREEN
ABO/RH(D): AB POS
ANTIBODY SCREEN: NEGATIVE

## 2015-04-03 LAB — PROLACTIN: Prolactin: 153.6 ng/mL — ABNORMAL HIGH (ref 4.8–23.3)

## 2015-04-04 LAB — GC/CHLAMYDIA PROBE AMP (~~LOC~~) NOT AT ARMC
Chlamydia: NEGATIVE
Neisseria Gonorrhea: NEGATIVE

## 2015-04-04 LAB — URINE CULTURE: Culture: >=100000

## 2015-04-07 ENCOUNTER — Inpatient Hospital Stay (HOSPITAL_COMMUNITY)
Admission: AD | Admit: 2015-04-07 | Discharge: 2015-04-10 | DRG: 775 | Disposition: A | Discharge status: Home / Self Care | Payer: Medicaid Other | Source: Ambulatory Visit | Attending: Obstetrics & Gynecology | Admitting: Obstetrics & Gynecology

## 2015-04-07 ENCOUNTER — Inpatient Hospital Stay (HOSPITAL_COMMUNITY): Payer: Medicaid Other | Admitting: Anesthesiology

## 2015-04-07 ENCOUNTER — Encounter (HOSPITAL_COMMUNITY): Payer: Self-pay | Admitting: *Deleted

## 2015-04-07 DIAGNOSIS — O99824 Streptococcus B carrier state complicating childbirth: Secondary | ICD-10-CM | POA: Diagnosis present

## 2015-04-07 DIAGNOSIS — O99334 Smoking (tobacco) complicating childbirth: Secondary | ICD-10-CM | POA: Diagnosis present

## 2015-04-07 DIAGNOSIS — F1120 Opioid dependence, uncomplicated: Secondary | ICD-10-CM | POA: Diagnosis present

## 2015-04-07 DIAGNOSIS — O9962 Diseases of the digestive system complicating childbirth: Principal | ICD-10-CM | POA: Diagnosis present

## 2015-04-07 DIAGNOSIS — Z833 Family history of diabetes mellitus: Secondary | ICD-10-CM | POA: Diagnosis not present

## 2015-04-07 DIAGNOSIS — F17210 Nicotine dependence, cigarettes, uncomplicated: Secondary | ICD-10-CM | POA: Diagnosis present

## 2015-04-07 DIAGNOSIS — O99820 Streptococcus B carrier state complicating pregnancy: Secondary | ICD-10-CM

## 2015-04-07 DIAGNOSIS — O0933 Supervision of pregnancy with insufficient antenatal care, third trimester: Secondary | ICD-10-CM

## 2015-04-07 DIAGNOSIS — IMO0001 Reserved for inherently not codable concepts without codable children: Secondary | ICD-10-CM

## 2015-04-07 DIAGNOSIS — Z3483 Encounter for supervision of other normal pregnancy, third trimester: Secondary | ICD-10-CM

## 2015-04-07 DIAGNOSIS — K219 Gastro-esophageal reflux disease without esophagitis: Secondary | ICD-10-CM | POA: Diagnosis present

## 2015-04-07 DIAGNOSIS — O99324 Drug use complicating childbirth: Secondary | ICD-10-CM | POA: Diagnosis present

## 2015-04-07 DIAGNOSIS — Z3A38 38 weeks gestation of pregnancy: Secondary | ICD-10-CM | POA: Diagnosis present

## 2015-04-07 DIAGNOSIS — M5431 Sciatica, right side: Secondary | ICD-10-CM

## 2015-04-07 LAB — RAPID URINE DRUG SCREEN, HOSP PERFORMED
Amphetamines: NOT DETECTED
Barbiturates: NOT DETECTED
Benzodiazepines: POSITIVE — AB
COCAINE: NOT DETECTED
OPIATES: NOT DETECTED
Tetrahydrocannabinol: NOT DETECTED

## 2015-04-07 LAB — TYPE AND SCREEN
ABO/RH(D): AB POS
ANTIBODY SCREEN: NEGATIVE

## 2015-04-07 LAB — CBC
HCT: 32.6 % — ABNORMAL LOW (ref 36.0–46.0)
Hemoglobin: 10.3 g/dL — ABNORMAL LOW (ref 12.0–15.0)
MCH: 24.8 pg — AB (ref 26.0–34.0)
MCHC: 31.6 g/dL (ref 30.0–36.0)
MCV: 78.6 fL (ref 78.0–100.0)
PLATELETS: 137 10*3/uL — AB (ref 150–400)
RBC: 4.15 MIL/uL (ref 3.87–5.11)
RDW: 15.7 % — AB (ref 11.5–15.5)
WBC: 11 10*3/uL — ABNORMAL HIGH (ref 4.0–10.5)

## 2015-04-07 MED ORDER — IBUPROFEN 600 MG PO TABS
600.0000 mg | ORAL_TABLET | Freq: Four times a day (QID) | ORAL | Status: DC
  Administered 2015-04-07 – 2015-04-10 (×11): 600 mg via ORAL
  Filled 2015-04-07 (×11): qty 1

## 2015-04-07 MED ORDER — LIDOCAINE HCL (PF) 1 % IJ SOLN
30.0000 mL | INTRAMUSCULAR | Status: DC | PRN
  Filled 2015-04-07: qty 30

## 2015-04-07 MED ORDER — PRENATAL MULTIVITAMIN CH
1.0000 | ORAL_TABLET | Freq: Every day | ORAL | Status: DC
  Administered 2015-04-08 – 2015-04-10 (×3): 1 via ORAL
  Filled 2015-04-07 (×3): qty 1

## 2015-04-07 MED ORDER — LACTATED RINGERS IV SOLN
500.0000 mL | INTRAVENOUS | Status: DC | PRN
  Administered 2015-04-07: 500 mL via INTRAVENOUS

## 2015-04-07 MED ORDER — EPHEDRINE 5 MG/ML INJ
10.0000 mg | INTRAVENOUS | Status: DC | PRN
  Filled 2015-04-07: qty 2

## 2015-04-07 MED ORDER — SENNOSIDES-DOCUSATE SODIUM 8.6-50 MG PO TABS
2.0000 | ORAL_TABLET | ORAL | Status: DC
  Administered 2015-04-08 – 2015-04-10 (×3): 2 via ORAL
  Filled 2015-04-07 (×3): qty 2

## 2015-04-07 MED ORDER — WITCH HAZEL-GLYCERIN EX PADS
1.0000 "application " | MEDICATED_PAD | CUTANEOUS | Status: DC | PRN
  Administered 2015-04-07: 1 via TOPICAL

## 2015-04-07 MED ORDER — LORAZEPAM 2 MG/ML IJ SOLN: 2.0000 mg | Freq: Once | INTRAMUSCULAR | Status: DC

## 2015-04-07 MED ORDER — METHADONE HCL 10 MG/ML PO CONC
80.0000 mg | Freq: Every day | ORAL | Status: DC
  Administered 2015-04-07 – 2015-04-10 (×4): 80 mg via ORAL
  Filled 2015-04-07 (×3): qty 8

## 2015-04-07 MED ORDER — DIBUCAINE 1 % RE OINT
1.0000 "application " | TOPICAL_OINTMENT | RECTAL | Status: DC | PRN
  Administered 2015-04-07: 1 via RECTAL
  Filled 2015-04-07: qty 28

## 2015-04-07 MED ORDER — OXYTOCIN 40 UNITS IN LACTATED RINGERS INFUSION - SIMPLE MED: 62.5000 mL/h | INTRAVENOUS | Status: DC | PRN

## 2015-04-07 MED ORDER — LIDOCAINE HCL (PF) 1 % IJ SOLN
INTRAMUSCULAR | Status: DC | PRN
  Administered 2015-04-07: 2 mL via EPIDURAL
  Administered 2015-04-07: 3 mL via EPIDURAL
  Administered 2015-04-07: 5 mL via EPIDURAL

## 2015-04-07 MED ORDER — OXYTOCIN 40 UNITS IN LACTATED RINGERS INFUSION - SIMPLE MED
62.5000 mL/h | INTRAVENOUS | Status: DC
  Administered 2015-04-07: 62.5 mL/h via INTRAVENOUS
  Filled 2015-04-07: qty 1000

## 2015-04-07 MED ORDER — TERBUTALINE SULFATE 1 MG/ML IJ SOLN
0.2500 mg | Freq: Once | INTRAMUSCULAR | Status: DC | PRN
  Filled 2015-04-07: qty 1

## 2015-04-07 MED ORDER — HYDROXYZINE HCL 50 MG PO TABS
50.0000 mg | ORAL_TABLET | Freq: Three times a day (TID) | ORAL | Status: DC | PRN
  Administered 2015-04-07: 50 mg via ORAL
  Filled 2015-04-07 (×2): qty 1

## 2015-04-07 MED ORDER — OXYTOCIN BOLUS FROM INFUSION
500.0000 mL | INTRAVENOUS | Status: DC
  Administered 2015-04-07: 500 mL via INTRAVENOUS

## 2015-04-07 MED ORDER — ZOLPIDEM TARTRATE 5 MG PO TABS: 5.0000 mg | ORAL_TABLET | Freq: Every evening | ORAL | Status: DC | PRN

## 2015-04-07 MED ORDER — TETANUS-DIPHTH-ACELL PERTUSSIS 5-2.5-18.5 LF-MCG/0.5 IM SUSP: 0.5000 mL | Freq: Once | INTRAMUSCULAR | Status: DC

## 2015-04-07 MED ORDER — DIPHENHYDRAMINE HCL 50 MG/ML IJ SOLN: 12.5000 mg | INTRAMUSCULAR | Status: DC | PRN

## 2015-04-07 MED ORDER — LORAZEPAM 2 MG/ML IJ SOLN
2.0000 mg | Freq: Once | INTRAMUSCULAR | Status: AC
  Administered 2015-04-07: 2 mg via INTRAVENOUS

## 2015-04-07 MED ORDER — PROMETHAZINE HCL 25 MG/ML IJ SOLN: 12.5000 mg | Freq: Four times a day (QID) | INTRAMUSCULAR | Status: DC | PRN

## 2015-04-07 MED ORDER — BENZOCAINE-MENTHOL 20-0.5 % EX AERO: 1.0000 "application " | INHALATION_SPRAY | CUTANEOUS | Status: DC | PRN

## 2015-04-07 MED ORDER — MEASLES, MUMPS & RUBELLA VAC ~~LOC~~ INJ
0.5000 mL | INJECTION | Freq: Once | SUBCUTANEOUS | Status: DC
  Filled 2015-04-07: qty 0.5

## 2015-04-07 MED ORDER — ACETAMINOPHEN 325 MG PO TABS: 650.0000 mg | ORAL_TABLET | ORAL | Status: DC | PRN

## 2015-04-07 MED ORDER — PHENYLEPHRINE 40 MCG/ML (10ML) SYRINGE FOR IV PUSH (FOR BLOOD PRESSURE SUPPORT)
80.0000 ug | PREFILLED_SYRINGE | INTRAVENOUS | Status: DC | PRN
  Filled 2015-04-07: qty 2
  Filled 2015-04-07: qty 20

## 2015-04-07 MED ORDER — CITRIC ACID-SODIUM CITRATE 334-500 MG/5ML PO SOLN: 30.0000 mL | ORAL | Status: DC | PRN

## 2015-04-07 MED ORDER — SIMETHICONE 80 MG PO CHEW: 80.0000 mg | CHEWABLE_TABLET | ORAL | Status: DC | PRN

## 2015-04-07 MED ORDER — FENTANYL 2.5 MCG/ML BUPIVACAINE 1/10 % EPIDURAL INFUSION (WH - ANES)
14.0000 mL/h | INTRAMUSCULAR | Status: DC | PRN
  Administered 2015-04-07: 14 mL/h via EPIDURAL
  Filled 2015-04-07: qty 125

## 2015-04-07 MED ORDER — DIPHENHYDRAMINE HCL 25 MG PO CAPS: 25.0000 mg | ORAL_CAPSULE | Freq: Four times a day (QID) | ORAL | Status: DC | PRN

## 2015-04-07 MED ORDER — LACTATED RINGERS IV SOLN
INTRAVENOUS | Status: DC
  Administered 2015-04-07: 13:00:00 via INTRAVENOUS

## 2015-04-07 MED ORDER — OXYTOCIN 40 UNITS IN LACTATED RINGERS INFUSION - SIMPLE MED
1.0000 m[IU]/min | INTRAVENOUS | Status: DC
  Administered 2015-04-07: 2 m[IU]/min via INTRAVENOUS

## 2015-04-07 MED ORDER — PENICILLIN G POTASSIUM 5000000 UNITS IJ SOLR
5.0000 10*6.[IU] | Freq: Once | INTRAVENOUS | Status: AC
  Administered 2015-04-07: 5 10*6.[IU] via INTRAVENOUS
  Filled 2015-04-07: qty 5

## 2015-04-07 MED ORDER — LANOLIN HYDROUS EX OINT: TOPICAL_OINTMENT | CUTANEOUS | Status: DC | PRN

## 2015-04-07 MED ORDER — ACETAMINOPHEN 325 MG PO TABS
650.0000 mg | ORAL_TABLET | ORAL | Status: DC | PRN
  Administered 2015-04-07 – 2015-04-10 (×5): 650 mg via ORAL
  Filled 2015-04-07 (×5): qty 2

## 2015-04-07 MED ORDER — PENICILLIN G POTASSIUM 5000000 UNITS IJ SOLR
2.5000 10*6.[IU] | INTRAVENOUS | Status: DC
  Administered 2015-04-07: 2.5 10*6.[IU] via INTRAVENOUS
  Filled 2015-04-07 (×4): qty 2.5
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
## 2015-04-07 NOTE — MAU Note (Signed)
Pt states she woke up this morning with bed wet and pink fluid on bed around 0900 She states she stood up and bloody fluid "shot out" of her vagina and then u/c's started after that.  Pt states good fetal movement.  Pt states she was suppose to have her methadone this morning but didn't because that's when all this happened.

## 2015-04-07 NOTE — Progress Notes (Signed)
CSW received call from [employer]/CPS worker stating he has an open case with patient.  He plans to come to the hospital tomorrow to meet with her.

## 2015-04-07 NOTE — Anesthesia Procedure Notes (Signed)
Epidural Patient location during procedure: OB  Staffing Anesthesiologist: Oryan Winterton EDWARD Performed by: anesthesiologist   Preanesthetic Checklist Completed: patient identified, pre-op evaluation, timeout performed, IV checked, risks and benefits discussed and monitors and equipment checked  Epidural Patient position: sitting Prep: DuraPrep Patient monitoring: blood pressure and continuous pulse ox Approach: midline Location: L3-L4 Injection technique: LOR air  Needle:  Needle type: Tuohy  Needle gauge: 17 G Needle length: 9 cm Needle insertion depth: 5 cm Catheter size: 19 Gauge Catheter at skin depth: 10 cm Test dose: negative and Other (1% Lidocaine)  Additional Notes Patient identified.  Risk benefits discussed including failed block, incomplete pain control, headache, nerve damage, paralysis, blood pressure changes, nausea, vomiting, reactions to medication both toxic or allergic, and postpartum back pain.  Patient expressed understanding and wished to proceed.  All questions were answered.  Sterile technique used throughout procedure and epidural site dressed with sterile barrier dressing. No paresthesia or other complications noted. The patient did not experience any signs of intravascular injection such as tinnitus or metallic taste in mouth nor signs of intrathecal spread such as rapid motor block. Please see nursing notes for vital signs. Reason for block:procedure for pain   

## 2015-04-07 NOTE — Anesthesia Preprocedure Evaluation (Addendum)
Anesthesia Evaluation  Patient identified by MRN, date of birth, ID band Patient awake    Reviewed: Allergy & Precautions, NPO status , Patient's Chart, lab work & pertinent test results  Airway Mallampati: II  TM Distance: >3 FB Neck ROM: Full    Dental  (+) Dental Advisory Given, Edentulous Upper, Edentulous Lower   Pulmonary Current Smoker,  breath sounds clear to auscultation  Pulmonary exam normal       Cardiovascular Exercise Tolerance: Good - anginanegative cardio ROS Normal cardiovascular examRhythm:Regular Rate:Normal     Neuro/Psych Seizures -,  PSYCHIATRIC DISORDERS Anxiety Depression History of polysubstance abuse--currently on methadone   GI/Hepatic Neg liver ROS, GERD-  ,  Endo/Other  Hypothyroidism   Renal/GU Renal disease     Musculoskeletal negative musculoskeletal ROS (+)   Abdominal   Peds  Hematology negative hematology ROS (+) Blood dyscrasia, anemia ,   Anesthesia Other Findings Day of surgery medications reviewed with the patient.  Reproductive/Obstetrics (+) Pregnancy                           Anesthesia Physical Anesthesia Plan  ASA: III  Anesthesia Plan: Epidural   Post-op Pain Management:    Induction:   Airway Management Planned:   Additional Equipment:   Intra-op Plan:   Post-operative Plan:   Informed Consent: I have reviewed the patients History and Physical, chart, labs and discussed the procedure including the risks, benefits and alternatives for the proposed anesthesia with the patient or authorized representative who has indicated his/her understanding and acceptance.   Dental advisory given  Plan Discussed with: CRNA  Anesthesia Plan Comments: (Patient identified. Risks/Benefits/Options discussed with patient including but not limited to bleeding, infection, nerve damage, paralysis, failed block, incomplete pain control, headache, blood  pressure changes, nausea, vomiting, reactions to medication both or allergic, itching and postpartum back pain. Confirmed with bedside nurse the patient's most recent platelet count. Confirmed with patient that they are not currently taking any anticoagulation, have any bleeding history or any family history of bleeding disorders. Patient expressed understanding and wished to proceed. All questions were answered. )        Anesthesia Quick Evaluation

## 2015-04-08 LAB — RPR: RPR: NONREACTIVE

## 2015-04-08 LAB — HIV ANTIBODY (ROUTINE TESTING W REFLEX): HIV SCREEN 4TH GENERATION: NONREACTIVE

## 2015-04-08 MED ORDER — TRAMADOL HCL 50 MG PO TABS
100.0000 mg | ORAL_TABLET | Freq: Once | ORAL | Status: AC
  Administered 2015-04-08: 100 mg via ORAL
  Filled 2015-04-08: qty 2

## 2015-04-08 NOTE — Progress Notes (Signed)
Attempted to call pt at [phone], with no answer and female voice on voice mail. Did not leave message, with female voice, not sure if we have the correct contact number. [name]

## 2015-04-08 NOTE — Progress Notes (Signed)
CSW spoke with CPS worker, [employer], after he met with MOB and FOB.  CPS worker reported that they are continuing to investigate, discharge recommendations not yet formulated.  CSW to remain in contact with CPS.  No discharge until CPS recommendations received.

## 2015-04-08 NOTE — Lactation Note (Signed)
This note was copied from the chart of Tammy Doloros Macinnes. Lactation Consultation Note  Initial visit made.  Breastfeeding consultation services and support information given to mom.  This is mom's third baby.  Her aunt has custody of oldest two children.  Mom has an open CPS case due to substance abuse/methadone use and complex psych/social history.  She states she breastfed her now 30 year old until her milk dried up.  That baby was in the NICU for withdrawal symptoms.  When I entered room mom was attempting to latch baby on.  She appeared to have a very difficult time keeping her eyes open and nodding off some.  Mom had much difficulty focusing on conversation regarding breastfeeding.  When I asked her when her baby ate last she was unsure.  She can hand express colostrum easily but baby too sleepy to feed.  Recommended she give baby 10-20 mls of formula every 3 hours.  Baby has been supplemented a few times.  Discussed above with MBU RN.  Patient Name: Tammy Winters [id number] Date: 04/08/2015 Reason for consult: Initial assessment   Maternal Data    Feeding Feeding Type: Breast Fed  LATCH Score/Interventions Latch: Too sleepy or reluctant, no latch achieved, no sucking elicited. Intervention(s): Teach feeding cues;Waking techniques Intervention(s): Breast compression;Breast massage;Assist with latch;Adjust position  Audible Swallowing: None Intervention(s): Hand expression;Skin to skin  Type of Nipple: Everted at rest and after stimulation  Comfort (Breast/Nipple): Soft / non-tender     Hold (Positioning): Assistance needed to correctly position infant at breast and maintain latch. Intervention(s): Breastfeeding basics reviewed;Support Pillows;Position options  LATCH Score: 5  Lactation Tools Discussed/Used     Consult Status Consult Status: Follow-up Date: 04/09/15 Follow-up type: In-patient    Huston Foley 04/08/2015, 5:25 PM

## 2015-04-08 NOTE — Progress Notes (Signed)
Patient appears very anxious this morning.  Unable to focus on one thought process at a time.  Awaiting the FOB to come up to hospital with her daughter.  She is worried about him getting downstairs to sign the birth certificate.  Assured her it's ok and that he has time.  She stated she was going outside to smoke.  Instructed her of our no smoking policy on hospital property.  She stated that she was going to take her one year old down to the cafeteria once here for a little one on one time.  FOB to stay with newborn in the room.  MOB is ambulating in the room independently.  Cox, Whittaker Lenis M

## 2015-04-08 NOTE — Progress Notes (Signed)
Pt is back in the building and seems appropriate. FOB with her. Dr Natale Milch, Dr. Alvester Morin, and Dr. Emelda Fear spoke with pt and family, and are speaking to charge nurse. Will continue to monitor for any changes and keep providers informed. [name]

## 2015-04-08 NOTE — Progress Notes (Signed)
Spoke with charge nurse and house coverage about pt being absent from room since before the beginning of my shift at 1900. Spoke with teaching service MD Natale Milch to inform them that pt has not been in the hospital for about 2 hours. Expecting a call back from teaching service with further instructions. Dr. Natale Milch states that she will speak with house coverage regarding this issue. [name]

## 2015-04-08 NOTE — Progress Notes (Signed)
Pt traveled outside of room x3 during the night to go outside. First trip around 2145 on 04/07/15 with FOB via wheelchair. Second trip outside in the 2300 hour by herself. Patient's third trip outside lasted approximately 1 hour and 15 minutes (~[id number]). RN had security look for patient outside after she had been gone about 1 hour. Pt noted to be outside main doors sitting on bench and returned back to room about 20 minutes later. Baby in newborn nursery for vast majority of night d/t temperature instability. Pt did ask appropriate questions about baby and how he was doing.
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
## 2015-04-08 NOTE — Anesthesia Postprocedure Evaluation (Signed)
Anesthesia Post Note  Patient: Tammy Winters  Procedure(s) Performed: * No procedures listed *  Anesthesia type: Epidural  Patient location: Mother/Baby  Post pain: Pain level controlled  Post assessment: Post-op Vital signs reviewed  Last Vitals:  Filed Vitals:   04/08/15 0550  BP: 116/72  Pulse: 72  Temp: 36.9 C  Resp: 18    Post vital signs: Reviewed  Level of consciousness:alert  Complications: No apparent anesthesia complications
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
## 2015-04-09 MED ORDER — TRAMADOL HCL 50 MG PO TABS
100.0000 mg | ORAL_TABLET | Freq: Once | ORAL | Status: AC
  Administered 2015-04-09: 100 mg via ORAL
  Filled 2015-04-09: qty 2
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
## 2015-04-09 NOTE — Progress Notes (Signed)
Pt c/o back pain radiates down R leg.  K.Booker,CNM notified and ordered pt heating pad

## 2015-04-10 ENCOUNTER — Ambulatory Visit: Payer: Self-pay

## 2015-04-10 DIAGNOSIS — M5431 Sciatica, right side: Secondary | ICD-10-CM

## 2015-04-10 DIAGNOSIS — F1120 Opioid dependence, uncomplicated: Secondary | ICD-10-CM | POA: Diagnosis present

## 2015-04-10 MED ORDER — TRAMADOL HCL 50 MG PO TABS
100.0000 mg | ORAL_TABLET | Freq: Once | ORAL | Status: AC
  Administered 2015-04-10: 100 mg via ORAL
  Filled 2015-04-10: qty 2

## 2015-04-10 MED ORDER — TRAMADOL HCL 50 MG PO TABS: 50.0000 mg | ORAL_TABLET | Freq: Four times a day (QID) | ORAL | Status: DC | PRN

## 2015-04-10 NOTE — Progress Notes (Signed)
Post Partum Day 3  Subjective:  Tammy Winters is a 29 y.o. [id number] [redacted]w[redacted]d s/p NSVD.  No acute events overnight.  Pt denies problems with ambulating, voiding or po intake.  She denies nausea or vomiting.  Pt still reporting lower back pain radiating down R leg. This has been somewhat improved by using a heating pad. Ultram yesterday helped. She has had flatus. She has not had bowel movement.  Lochia Moderate.  Plan for birth control is LARC.  Method of Feeding: Breast.  Objective: BP 116/81 mmHg  Pulse 92  Temp(Src) 97.7 F (36.5 C) (Oral)  Resp 18  Ht  (1.6 m)  Wt 170 lb (77.111 kg)  BMI 30.12 kg/m2  SpO2 99%  LMP 07/21/2014  Breastfeeding? Unknown  Physical Exam:  General: alert, cooperative and no distress Lochia:normal flow Chest: non-labored breathing Heart: RRR no murmurs Abdomen: soft, nontender, fundus firm at/below umbilicus Uterine Fundus: firm DVT Evaluation: No evidence of DVT seen on physical exam. Extremities: 1+ pitting edema to ankles bilaterally   Recent Labs  04/07/15 1332  HGB 10.3*  HCT 32.6*    Assessment/Plan:  ASSESSMENT: Tammy Winters is a 29 y.o. [id number] [redacted]w[redacted]d ppd #3 s/p NSVD doing well.   Discharge when seen by CPS Cont heating pad for back/leg pain. Can consider Ultram if pain is persistent.    LOS: 3 days   Tarri Abernethy 04/10/2015, 6:23 AM  i examined pt and agree with assessment
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
## 2015-04-10 NOTE — Discharge Summary (Signed)
OB Discharge Summary  Patient Name: Tammy Winters DOB: 1985-09-21 MRN: [id number]  Date of admission: 04/07/2015 Delivering MD: Shonna Chock BEDFORD   Date of discharge:04/10/2015  Admitting diagnosis: Onset of Labor   Intrauterine pregnancy: [redacted]w[redacted]d     Secondary diagnosis: methadone maintenance regimen     Discharge diagnosis: Term Pregnancy Delivered and methadone maintenance                                                                                            Post partum procedures:depo Provera, while in hospital, then postpartum tubal(per Pt)  Augmentation:   Complications:None   Hospital course:   Tammy Winters is a 29 y.o. female [phone] with IUP at [redacted]w[redacted]d by LMP presenting for contractions which started this morning and have been frequent and painful for 3 hours. Also reporting blood-tinged mucous discharge. Patient has had scant prenatal care. She takes methadone and missed her morning dose. She reports +FMs, no VB, no blurry vision, headaches or peripheral edema, and RUQ pain.    Onset of Labor With Vaginal Delivery     29 y.o. yo [id number] at [redacted]w[redacted]d was admitted in Latent Laboron 04/07/2015. Patient had an uncomplicated labor course as follows:  Intrapartum Procedures: Episiotomy: None [1]                                         Lacerations:  None [1]  Mediations and procedures used include: Pitocin  Patient had a delivery of a Viable infant. 04/07/2015  Information for the patient's newborn:  Tammy, Winters [[id number]]  Delivery Method: Vaginal, Spontaneous Delivery (Filed from Delivery Summary)    Pateint had an uncomplicated postpartum course.  She is ambulating, tolerating a regular diet, passing flatus, and urinating well. Patient is discharged home in stable condition on No discharge date for patient encounter.Marland Kitchen    Physical exam  Filed Vitals:   04/08/15 1746 04/09/15 0717 04/09/15 2121 04/10/15 0616  BP:  131/90 123/91 116/81  Pulse: 96 88 83 92  Temp:  98.4 F (36.9 C) 98 F (36.7 C) 98.3 F (36.8 C) 97.7 F (36.5 C)  TempSrc: Oral Oral Oral Oral  Resp: Height:      Weight:      SpO2:    99%   General: alert, cooperative and no distress with hyperactive behaviour, normal for this patient, and complaints of right leg discomfort with walking into back of leg Lochia: appropriate Uterine Fundus: firm Incision:  DVT Evaluation: No evidence of DVT seen on physical exam. Calf/Ankle edema is present Labs: Lab Results  Component Value Date   WBC 11.0* 04/07/2015   HGB 10.3* 04/07/2015   HCT 32.6* 04/07/2015   MCV 78.6 04/07/2015   PLT 137* 04/07/2015   CMP Latest Ref Rng 04/02/2015  Glucose 65 - 99 mg/dL 81  BUN 6 - 20 mg/dL 10  Creatinine 8.54 - 6.27 mg/dL 0.35  Sodium 009 - 381 mmol/L 134(L)  Potassium 3.5 - 5.1 mmol/L 4.1  Chloride  101 - 111 mmol/L 100(L)  CO2 22 - 32 mmol/L 22  Calcium 8.9 - 10.3 mg/dL 9.2  Total Protein 6.5 - 8.1 g/dL 7.1  Total Bilirubin 0.3 - 1.2 mg/dL 0.5  Alkaline Phos 38 - 126 U/L 281(H)  AST 15 - 41 U/L 27  ALT 14 - 54 U/L 23    Discharge instruction: per After Visit Summary and "Baby and Me Booklet".  Medications:   Medication List    TAKE these medications        methadone 10 MG/5ML solution  Commonly known as:  DOLOPHINE  Take 80 mg by mouth daily.     traMADol 50 MG tablet  Commonly known as:  ULTRAM  Take 1 tablet (50 mg total) by mouth every 6 (six) hours as needed for moderate pain or severe pain. For sciatica        Diet: routine diet  Activity: Advance as tolerated. Pelvic rest for 6 weeks.   Outpatient follow up:6 weeks  Postpartum contraception: Depo Provera and then tubal ligation  Newborn Data: Live born female  Birth Weight: 6 lb 4.2 oz (2840 g) APGAR: 9, 9  Baby Feeding: Breast Disposition:rooming in x 5 d while methadone withdrawal assessed.              IRC and Social work still working on whether patient will be able to care for baby, as her  support system is primarily social services.  CPS contact is [facility], [phone]. See Social Work note by Greg Cutter, of 04/08/15 @ 12 pm   04/10/2015 Tilda Burrow, MD

## 2015-04-11 ENCOUNTER — Ambulatory Visit: Payer: Self-pay

## 2015-04-11 NOTE — Lactation Note (Signed)
This note was copied from the chart of Tammy Winters. Lactation Consultation Note  10.7% weight loss.  Mother has increased volume she is giving to baby. Mother states she prefers to pump and bottle feed. She recently pumped 40 ml of breastmilk and gave baby also an additional 15 ml. Mother aware of waking techniques, feeding baby at least every 3 hours  or on demand and pumping schedule. Provided mother w/ another hand pump so she can pump both breasts since she plans to use manual pump upon discharge. Answered questions and suggest she call if needs further assistance.     Patient Name: Tammy Laquinta Hazell [id number] Date: 04/11/2015 Reason for consult: Follow-up assessment   Maternal Data    Feeding Feeding Type: Bottle Fed - Breast Milk Nipple Type: Slow - flow  LATCH Score/Interventions                      Lactation Tools Discussed/Used     Consult Status Consult Status: Follow-up Date: 04/12/15 Follow-up type: In-patient    Dahlia Byes [facility] 04/11/2015, 3:48 PM

## 2015-04-13 ENCOUNTER — Ambulatory Visit: Payer: Self-pay

## 2015-04-13 ENCOUNTER — Encounter: Payer: Medicaid Other | Admitting: Obstetrics and Gynecology

## 2015-04-13 NOTE — Lactation Note (Signed)
This note was copied from the chart of Boy Nori Toral. Lactation Consultation Note  Patient Name: Boy Jazzmin Newbold ZOXWR'U Date: 04/13/2015 Reason for consult: Follow-up assessment;Infant weight loss  Mom had baby latched when LC arrived reporting baby latched 5 minutes previous. Baby very sleepy no swallows noted. Mom took baby off the breast after another 5 minutes and was getting ready to supplement. Discussed with Mom baby's increase weight loss and encouraged to limit time at the breast to 30 minutes then increase supplements to 45-60 ml with each feeding. Mom not pumping consistently. Stressed importance of pumping to bring her milk in well and to have EBM to supplement to help with withdrawal symptoms. Will try to f/u again with Mom later today to develop feeding plan.  Maternal Data    Feeding Feeding Type: Formula Nipple Type: Slow - flow Length of feed: 20 min  LATCH Score/Interventions                      Lactation Tools Discussed/Used     Consult Status Consult Status: Follow-up Date: 04/13/15 Follow-up type: In-patient    Alfred Levins 04/13/2015, 9:45 AM

## 2015-04-13 NOTE — Lactation Note (Signed)
This note was copied from the chart of Tammy Winters. Lactation Consultation Note  Patient Name: Tammy Winters [id number] Date: 04/13/2015  Per Social Worker note, baby not going home with Mom. Baby will be in foster care.  Did not see Mom for f/u today.    Maternal Data    Feeding Feeding Type: Formula Nipple Type: Slow - flow  LATCH Score/Interventions                      Lactation Tools Discussed/Used     Consult Status      Alfred Levins 04/13/2015, 3:08 PM

## 2015-05-19 ENCOUNTER — Ambulatory Visit: Payer: Medicaid Other | Admitting: Certified Nurse Midwife

## 2015-07-15 ENCOUNTER — Encounter (HOSPITAL_COMMUNITY): Payer: Self-pay | Admitting: Emergency Medicine

## 2015-07-15 ENCOUNTER — Emergency Department (HOSPITAL_COMMUNITY)
Admission: EM | Admit: 2015-07-15 | Discharge: 2015-07-15 | Disposition: A | Discharge status: Home / Self Care | Payer: Medicaid Other | Attending: Emergency Medicine | Admitting: Emergency Medicine

## 2015-07-15 DIAGNOSIS — Z8719 Personal history of other diseases of the digestive system: Secondary | ICD-10-CM | POA: Diagnosis not present

## 2015-07-15 DIAGNOSIS — Z8742 Personal history of other diseases of the female genital tract: Secondary | ICD-10-CM | POA: Insufficient documentation

## 2015-07-15 DIAGNOSIS — G8929 Other chronic pain: Secondary | ICD-10-CM | POA: Diagnosis not present

## 2015-07-15 DIAGNOSIS — Z79899 Other long term (current) drug therapy: Secondary | ICD-10-CM | POA: Diagnosis not present

## 2015-07-15 DIAGNOSIS — F17210 Nicotine dependence, cigarettes, uncomplicated: Secondary | ICD-10-CM | POA: Insufficient documentation

## 2015-07-15 DIAGNOSIS — Z8639 Personal history of other endocrine, nutritional and metabolic disease: Secondary | ICD-10-CM | POA: Diagnosis not present

## 2015-07-15 DIAGNOSIS — M79604 Pain in right leg: Secondary | ICD-10-CM | POA: Diagnosis present

## 2015-07-15 DIAGNOSIS — M5410 Radiculopathy, site unspecified: Secondary | ICD-10-CM | POA: Diagnosis not present

## 2015-07-15 DIAGNOSIS — Z8659 Personal history of other mental and behavioral disorders: Secondary | ICD-10-CM | POA: Diagnosis not present

## 2015-07-15 MED ORDER — METHOCARBAMOL 500 MG PO TABS: 500.0000 mg | ORAL_TABLET | Freq: Two times a day (BID) | ORAL | Status: AC

## 2015-07-15 MED ORDER — TRAMADOL HCL 50 MG PO TABS
100.0000 mg | ORAL_TABLET | Freq: Once | ORAL | Status: AC
  Administered 2015-07-15: 100 mg via ORAL
  Filled 2015-07-15: qty 2

## 2015-07-15 MED ORDER — PREDNISONE 20 MG PO TABS: ORAL_TABLET | ORAL | Status: AC

## 2015-07-15 NOTE — ED Provider Notes (Signed)
CSN: [id number]     Arrival date & time 07/15/15  1220 History  By signing my name below, I, Lyndel SafeKaitlyn Shelton, attest that this documentation has been prepared under the direction and in the presence of Fayrene HelperBowie Alynah Schone, PA-C.  Electronically Signed: Lyndel SafeKaitlyn Shelton, ED Scribe. 07/15/2015. 12:51 PM.   Chief Complaint  Patient presents with  . Leg Pain   The history is provided by the patient. No language interpreter was used.   HPI Comments: Tammy Winters is a 29 y.o. female, with a h/o drug abuse and chronic lower back pain, who presents to the Emergency Department complaining of constant, severe, throbbing right lower back pain and gluteal pain that radiates posteriorly down right leg with pain worse in right calf X 5 days onset without injury. Her pain is worse with any movement. She notes a possible history of DVT in RLE but pt was not treated at that time and is not taking oral contraceptive. She is unemployed and denies repetitive movements. She has been taking ibuprofen without relief and is currently in a methadone clinic. Pt has been ambulatory without difficulty. Denies fevers, chills, hematuria, and incontinence of bladder or bowel or overlying skin changes.   Past Medical History  Diagnosis Date  . Drug addict (HCC)   . Heroin abuse   . Anxiety   . GERD (gastroesophageal reflux disease)   . History of stomach ulcers   . 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 Chronic lower back pain   . Pyelonephritis   . Hypothyroidism   . History of stomach ulcers 02/28/2013   Past Surgical History  Procedure Laterality Date  . Cholecystectomy    . Dilation and curettage of uterus      "I've had 3" (01/26/2013)  . Multiple tooth extractions      "I've had all my teeth removed" (01/26/2013)  . Ureteral stent placement      Had removed later  . Dilation and curettage of uterus     Family History  Problem Relation Age of Onset  . Diabetes Mother   . Cancer Mother   .  Diabetes Father   . Cancer Father    Social History  Substance Use Topics  . Smoking status: Current Every Day Smoker -- 1.00 packs/day for 12 years    Types: Cigarettes  . Smokeless tobacco: Never Used  . Alcohol Use: No   OB History    Gravida Para Term Preterm AB TAB SAB Ectopic Multiple Living   6 4 4  1  1   0 1     Review of Systems  Constitutional: Negative for fever and chills.  Genitourinary: Negative for hematuria.  Musculoskeletal: Positive for back pain (right lower) and arthralgias ( right leg).  Skin: Negative for color change and wound.  Neurological: Negative for weakness and numbness.   Allergies  Nubain; Adhesive; Reglan; and Zofran  Home Medications   Prior to Admission medications   Medication Sig Start Date End Date Taking? Authorizing Provider  methadone (DOLOPHINE) 10 MG/5ML solution Take 80 mg by mouth daily.    Yes Historical Provider, MD  methocarbamol (ROBAXIN) 500 MG tablet Take 1 tablet (500 mg total) by mouth 2 (two) times daily. 07/15/15   Fayrene HelperBowie Tamar Lipscomb, PA-C  predniSONE (DELTASONE) 20 MG tablet 2 tabs po daily x 4 days 07/15/15   Fayrene HelperBowie Dadrian Ballantine, PA-C   BP 112/93 mmHg  Pulse 103  Temp(Src) 97.7 F (36.5 C) (Oral)  Resp  22  Ht  (1.6 m)  Wt 149 lb (67.586 kg)  BMI 26.40 kg/m2  SpO2 96%  LMP 07/06/2015 Physical Exam  Constitutional: She is oriented to person, place, and time. She appears well-developed and well-nourished. No distress.  HENT:  Head: Normocephalic.  Eyes: Conjunctivae are normal.  Neck: Normal range of motion. Neck supple.  Cardiovascular: Normal rate.   Pulmonary/Chest: Effort normal. No respiratory distress.  Musculoskeletal: Normal range of motion. She exhibits tenderness (tenderness along Lspine, R lumbosacral region).  Equal strength and sensation in BLE; patellar DTRs intact; able to ambulate   Neurological: She is alert and oriented to person, place, and time. Coordination normal.  Skin: Skin is warm.  Psychiatric:  She has a normal mood and affect. Her behavior is normal.  Nursing note and vitals reviewed.   ED Course  Procedures  DIAGNOSTIC STUDIES: Oxygen Saturation is 96% on RA, adequate by my interpretation.    COORDINATION OF CARE: 12:47 PM Suspect a pinched nerve. Low suspicion for DVT given the location of pain.  She is NVI. Discussed treatment plan which includes to prescribe robaxin and a steroid course and give referral to orthopedist with pt. Pt is in a methadone clinic and has pain management there. Pt acknowledges and agrees to plan.   MDM   Patient with radicular back pain.  No neurological deficits and normal neuro exam.  Patient is ambulatory.  No loss of bowel or bladder control.  No concern for cauda equina.  No fever, night sweats, weight loss, h/o cancer, IVDA, no recent procedure to back. No urinary symptoms suggestive of UTI.  Supportive care and return precaution discussed. Appears safe for discharge at this time. Follow up as indicated in discharge paperwork.   Final diagnoses:  Radicular leg pain    BP 112/93 mmHg  Pulse 103  Temp(Src) 97.7 F (36.5 C) (Oral)  Resp 22  Ht  (1.6 m)  Wt 67.586 kg  BMI 26.40 kg/m2  SpO2 96%  LMP 07/06/2015 pt was crying when HR was taken.  It has normalized   I personally performed the services described in this documentation, which was scribed in my presence. The recorded information has been reviewed and is accurate.     Fayrene Helper, PA-C 07/15/15 1318  Leta Baptist, MD 07/22/15 [phone]

## 2015-07-15 NOTE — Discharge Instructions (Signed)
Radicular Pain °Radicular pain in either the arm or leg is usually from a bulging or herniated disk in the spine. A piece of the herniated disk may press against the nerves as the nerves exit the spine. This causes pain which is felt at the tips of the nerves down the arm or leg. Other causes of radicular pain may include: °· Fractures. °· Heart disease. °· Cancer. °· An abnormal and usually degenerative state of the nervous system or nerves (neuropathy). °Diagnosis may require CT or MRI scanning to determine the primary cause.  °Nerves that start at the neck (nerve roots) may cause radicular pain in the outer shoulder and arm. It can spread down to the thumb and fingers. The symptoms vary depending on which nerve root has been affected. In most cases radicular pain improves with conservative treatment. Neck problems may require physical therapy, a neck collar, or cervical traction. Treatment may take many weeks, and surgery may be considered if the symptoms do not improve.  °Conservative treatment is also recommended for sciatica. Sciatica causes pain to radiate from the lower back or buttock area down the leg into the foot. Often there is a history of back problems. Most patients with sciatica are better after 2 to 4 weeks of rest and other supportive care. Short term bed rest can reduce the disk pressure considerably. Sitting, however, is not a good position since this increases the pressure on the disk. You should avoid bending, lifting, and all other activities which make the problem worse. Traction can be used in severe cases. Surgery is usually reserved for patients who do not improve within the first months of treatment. °Only take over-the-counter or prescription medicines for pain, discomfort, or fever as directed by your caregiver. Narcotics and muscle relaxants may help by relieving more severe pain and spasm and by providing mild sedation. Cold or massage can give significant relief. Spinal manipulation  is not recommended. It can increase the degree of disc protrusion. Epidural steroid injections are often effective treatment for radicular pain. These injections deliver medicine to the spinal nerve in the space between the protective covering of the spinal cord and back bones (vertebrae). Your caregiver can give you more information about steroid injections. These injections are most effective when given within two weeks of the onset of pain.  °You should see your caregiver for follow up care as recommended. A program for neck and back injury rehabilitation with stretching and strengthening exercises is an important part of management.  °SEEK IMMEDIATE MEDICAL CARE IF: °· You develop increased pain, weakness, or numbness in your arm or leg. °· You develop difficulty with bladder or bowel control. °· You develop abdominal pain. °  °This information is not intended to replace advice given to you by your health care provider. Make sure you discuss any questions you have with your health care provider. °  °Document Released: 08/30/2004 Document Revised: 08/13/2014 Document Reviewed: 02/16/2015 °Elsevier Interactive Patient Education ©2016 [employer] ° °

## 2015-07-15 NOTE — ED Notes (Signed)
Patient states R leg pain from hip to calf.  Patient denies injury.   Patient states takes ibuprofen and tylenol at home.   Patient states is in a methadone clinic and wants no narcotics.

## 2015-09-29 ENCOUNTER — Encounter (HOSPITAL_COMMUNITY): Payer: Self-pay | Admitting: *Deleted

## 2015-09-29 ENCOUNTER — Emergency Department (HOSPITAL_COMMUNITY)
Admission: EM | Admit: 2015-09-29 | Discharge: 2015-09-30 | Discharge status: Against Medical Advice | Payer: Medicaid Other | Attending: Emergency Medicine | Admitting: Emergency Medicine

## 2015-09-29 DIAGNOSIS — R1030 Lower abdominal pain, unspecified: Secondary | ICD-10-CM | POA: Diagnosis present

## 2015-09-29 DIAGNOSIS — K649 Unspecified hemorrhoids: Secondary | ICD-10-CM | POA: Diagnosis not present

## 2015-09-29 DIAGNOSIS — F17210 Nicotine dependence, cigarettes, uncomplicated: Secondary | ICD-10-CM | POA: Diagnosis not present

## 2015-09-29 DIAGNOSIS — K5900 Constipation, unspecified: Secondary | ICD-10-CM | POA: Diagnosis not present

## 2015-09-29 DIAGNOSIS — M791 Myalgia: Secondary | ICD-10-CM | POA: Diagnosis not present

## 2015-09-29 DIAGNOSIS — N63 Unspecified lump in breast: Secondary | ICD-10-CM | POA: Diagnosis not present

## 2015-09-29 DIAGNOSIS — G8929 Other chronic pain: Secondary | ICD-10-CM | POA: Insufficient documentation

## 2015-09-29 DIAGNOSIS — R51 Headache: Secondary | ICD-10-CM | POA: Diagnosis not present

## 2015-09-29 LAB — COMPREHENSIVE METABOLIC PANEL
ALK PHOS: 76 U/L (ref 38–126)
ALT: 15 U/L (ref 14–54)
AST: 22 U/L (ref 15–41)
Albumin: 4.2 g/dL (ref 3.5–5.0)
Anion gap: 11 (ref 5–15)
BUN: 7 mg/dL (ref 6–20)
CALCIUM: 9.6 mg/dL (ref 8.9–10.3)
CHLORIDE: 102 mmol/L (ref 101–111)
CO2: 27 mmol/L (ref 22–32)
CREATININE: 0.9 mg/dL (ref 0.44–1.00)
Glucose, Bld: 87 mg/dL (ref 65–99)
Potassium: 4.4 mmol/L (ref 3.5–5.1)
Sodium: 140 mmol/L (ref 135–145)
Total Protein: 7.6 g/dL (ref 6.5–8.1)

## 2015-09-29 LAB — CBC
HCT: 37.8 % (ref 36.0–46.0)
Hemoglobin: 12.2 g/dL (ref 12.0–15.0)
MCH: 27.7 pg (ref 26.0–34.0)
MCHC: 32.3 g/dL (ref 30.0–36.0)
MCV: 85.7 fL (ref 78.0–100.0)
PLATELETS: 294 10*3/uL (ref 150–400)
RBC: 4.41 MIL/uL (ref 3.87–5.11)
RDW: 15.3 % (ref 11.5–15.5)
WBC: 10.5 10*3/uL (ref 4.0–10.5)

## 2015-09-29 LAB — URINALYSIS, ROUTINE W REFLEX MICROSCOPIC
Bilirubin Urine: NEGATIVE
GLUCOSE, UA: NEGATIVE mg/dL
KETONES UR: NEGATIVE mg/dL
LEUKOCYTES UA: NEGATIVE
Nitrite: POSITIVE — AB
PROTEIN: 30 mg/dL — AB
Specific Gravity, Urine: 1.025 (ref 1.005–1.030)
pH: 5 (ref 5.0–8.0)

## 2015-09-29 LAB — URINE MICROSCOPIC-ADD ON

## 2015-09-29 LAB — LIPASE, BLOOD: LIPASE: 21 U/L (ref 11–51)

## 2015-09-29 LAB — PREGNANCY, URINE: PREG TEST UR: NEGATIVE

## 2015-09-29 NOTE — ED Notes (Signed)
Pt called again for vital sign reassessment, no answer.

## 2015-09-29 NOTE — ED Notes (Signed)
Pt called for vital sign reassessment, no answer. 

## 2015-09-29 NOTE — ED Notes (Addendum)
Pt reports lower abdominal pain for the past three hours. Pt also c/o hemorrhoids, headache, body aches since last night. Pt states she is on methadone and is always constipated. Pt also c/o right flank pain. Pt states he has a "pocket of urine on her kidney". Pt states she also has a hard mass on each breast that is leaking.

## 2016-04-10 DIAGNOSIS — F1910 Other psychoactive substance abuse, uncomplicated: Secondary | ICD-10-CM

## 2016-04-23 NOTE — Congregational Nurse Program (Signed)
Congregational Nurse Program Note  Date of Encounter: 04/10/2016  Past Medical History: Past Medical History:  Diagnosis Date  . Anxiety   . Chronic lower back pain   . Drug addict (HCC)   . GERD (gastroesophageal reflux disease)   . Heroin abuse   . History of stomach ulcers   . History of stomach ulcers 02/28/2013  . Hypothyroidism   . Pyelonephritis   . Seizures (HCC)    secondary to methadone withdrawal     Encounter Details:     CNP Questionnaire - 04/11/16 1142      Patient Demographics   Is this a new or existing patient? New   Patient is considered a/an Not Applicable   Race Caucasian/White     Patient Assistance   Location of Patient Assistance Not Applicable   Patient's financial/insurance status Low Income;Self-Pay   Patient referred to apply for the following financial assistance [employer] Renew[employer]   Food insecurities addressed Not Applicable   Transportation assistance No   Assistance securing medications No   Educational Designer, television/film sethealth offerings Behavioral health;Spiritual care     Encounter Details   Primary purpose of visit Education/Health Concerns;Spiritual Care/Support Visit   Was an Emergency Department visit averted? Not Applicable   Does patient have a medical provider? No   Patient referred to Clinic;Area Agency   Was a mental health screening completed? (GAINS tool) No   Does patient have dental issues? No   Does patient have vision issues? No   Does your patient have an abnormal blood pressure today? No   Since previous encounter, have you referred patient for abnormal blood pressure that resulted in a new diagnosis or medication change? No   Does your patient have an abnormal blood glucose today? No   Since previous encounter, have you referred patient for abnormal blood glucose that resulted in a new diagnosis or medication change? No   Was there a life-saving intervention made? No     Stated had a spiritual experience and wanted  to be off drugs.  Is currently on methadone for treatment and wants to come off the methadone.  Discussed with her the need for strong support systems and treatment options.  Referred to Lavinia SharpsMary Ann Placey NP for f/u with medication issues.  [facility].  Encouraged to return to clinic to see me for support.

## 2016-04-24 ENCOUNTER — Emergency Department (HOSPITAL_COMMUNITY)
Admission: EM | Admit: 2016-04-24 | Discharge: 2016-04-24 | Disposition: A | Discharge status: Home / Self Care | Payer: Self-pay | Attending: Emergency Medicine | Admitting: Emergency Medicine

## 2016-04-24 ENCOUNTER — Emergency Department (HOSPITAL_COMMUNITY): Payer: Self-pay

## 2016-04-24 ENCOUNTER — Encounter (HOSPITAL_COMMUNITY): Payer: Self-pay

## 2016-04-24 DIAGNOSIS — F1410 Cocaine abuse, uncomplicated: Secondary | ICD-10-CM | POA: Insufficient documentation

## 2016-04-24 DIAGNOSIS — F1920 Other psychoactive substance dependence, uncomplicated: Secondary | ICD-10-CM

## 2016-04-24 DIAGNOSIS — E039 Hypothyroidism, unspecified: Secondary | ICD-10-CM | POA: Insufficient documentation

## 2016-04-24 DIAGNOSIS — F17210 Nicotine dependence, cigarettes, uncomplicated: Secondary | ICD-10-CM | POA: Insufficient documentation

## 2016-04-24 DIAGNOSIS — Z79899 Other long term (current) drug therapy: Secondary | ICD-10-CM | POA: Insufficient documentation

## 2016-04-24 HISTORY — DX: Dorsalgia, unspecified: M54.9

## 2016-04-24 LAB — CBC WITH DIFFERENTIAL/PLATELET
BASOS ABS: 0 10*3/uL (ref 0.0–0.1)
BASOS PCT: 0 %
EOS PCT: 1 %
Eosinophils Absolute: 0.2 10*3/uL (ref 0.0–0.7)
HEMATOCRIT: 39.9 % (ref 36.0–46.0)
Hemoglobin: 12.9 g/dL (ref 12.0–15.0)
Lymphocytes Relative: 16 %
Lymphs Abs: 2.2 10*3/uL (ref 0.7–4.0)
MCH: 29.1 pg (ref 26.0–34.0)
MCHC: 32.3 g/dL (ref 30.0–36.0)
MCV: 90.1 fL (ref 78.0–100.0)
MONO ABS: 1.4 10*3/uL — AB (ref 0.1–1.0)
MONOS PCT: 10 %
NEUTROS ABS: 9.9 10*3/uL — AB (ref 1.7–7.7)
Neutrophils Relative %: 72 %
PLATELETS: 282 10*3/uL (ref 150–400)
RBC: 4.43 MIL/uL (ref 3.87–5.11)
RDW: 15.9 % — AB (ref 11.5–15.5)
WBC: 13.8 10*3/uL — ABNORMAL HIGH (ref 4.0–10.5)

## 2016-04-24 LAB — COMPREHENSIVE METABOLIC PANEL
ALBUMIN: 3.6 g/dL (ref 3.5–5.0)
ALT: 12 U/L — ABNORMAL LOW (ref 14–54)
ANION GAP: 10 (ref 5–15)
AST: 17 U/L (ref 15–41)
Alkaline Phosphatase: 65 U/L (ref 38–126)
BILIRUBIN TOTAL: 0.4 mg/dL (ref 0.3–1.2)
BUN: 10 mg/dL (ref 6–20)
CALCIUM: 9.2 mg/dL (ref 8.9–10.3)
CO2: 25 mmol/L (ref 22–32)
CREATININE: 0.66 mg/dL (ref 0.44–1.00)
Chloride: 104 mmol/L (ref 101–111)
Glucose, Bld: 87 mg/dL (ref 65–99)
Potassium: 3.5 mmol/L (ref 3.5–5.1)
Sodium: 139 mmol/L (ref 135–145)
TOTAL PROTEIN: 7.3 g/dL (ref 6.5–8.1)

## 2016-04-24 LAB — RAPID URINE DRUG SCREEN, HOSP PERFORMED
Amphetamines: NOT DETECTED
BARBITURATES: NOT DETECTED
BENZODIAZEPINES: POSITIVE — AB
COCAINE: POSITIVE — AB
Opiates: NOT DETECTED
Tetrahydrocannabinol: POSITIVE — AB

## 2016-04-24 LAB — ETHANOL

## 2016-04-24 MED ORDER — ACETAMINOPHEN 500 MG PO TABS
1000.0000 mg | ORAL_TABLET | Freq: Once | ORAL | Status: AC
  Administered 2016-04-24: 1000 mg via ORAL
  Filled 2016-04-24: qty 2

## 2016-04-24 MED ORDER — GUAIFENESIN 100 MG/5ML PO SOLN
5.0000 mL | Freq: Once | ORAL | Status: AC
  Administered 2016-04-24: 100 mg via ORAL
  Filled 2016-04-24: qty 5

## 2016-04-24 MED ORDER — GI COCKTAIL ~~LOC~~
30.0000 mL | Freq: Once | ORAL | Status: AC
  Administered 2016-04-24: 30 mL via ORAL
  Filled 2016-04-24: qty 30

## 2016-04-24 MED ORDER — HYDROXYZINE HCL 25 MG PO TABS: 25.0000 mg | ORAL_TABLET | Freq: Four times a day (QID) | ORAL | 0 refills | Status: AC | PRN

## 2016-04-24 MED ORDER — GABAPENTIN 100 MG PO CAPS: 100.0000 mg | ORAL_CAPSULE | Freq: Three times a day (TID) | ORAL | 0 refills | Status: AC

## 2016-04-24 NOTE — ED Notes (Signed)
Pt in radiology 

## 2016-04-24 NOTE — Care Management (Addendum)
CM received consult concerning patient not being able to afford discharge medications. reviewed record, no health insurance listed. CM met with patient at bedside patient reports being homeless and not being able to afford medications. Pt  eligible for [facility] program. Discussed MATCH program and the guidelines including the  $3 co-pay per prescription. Patient stated she does not have the co-pay at this time. Pt enrolled and MATCH program.  Pt verbalized understanding and is agreeable with plan.  Patient requesting prescription and [place] letter be faxed to to [facility] on [address]. CM faxed to [phone] received fax confirmation. Patient made aware, verbalized appreciation. Reminded patient of the importance of following up with PCP. Pt verbalized understanding teach back done. No further ED CM needs identified.
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
## 2016-04-24 NOTE — ED Triage Notes (Addendum)
Per Pt, Pt is coming to ED with request for detox from heroin, opioids, and pills. Pt reports being in jail for two days about three weeks ago. Pt was released and sent to hospital for high fevers. Pt reports having a LP with no results and was sent to facility until she was released. Pt was going to the methadone clinic before she was taken to jail. Pt reports cough, congestion, and chest pain. Reports Hx of anxiety and seizures with detox.

## 2016-04-24 NOTE — ED Notes (Signed)
Pt updated on wait status, pt stated she is cramping and starting to have shakes

## 2016-04-24 NOTE — ED Notes (Signed)
When getting vitals, pt seemed to fall forward in chair. Pt stated that she has a history of seizures. Informed Eric - RN.

## 2016-04-24 NOTE — Discharge Instructions (Addendum)
[facility], where [facility].  Avoid using illegal drugs.  Follow-up for treatment and wanted counseling or treatment centers, to help you stay away from drugs.

## 2016-04-24 NOTE — ED Triage Notes (Signed)
Pt reports diarrhea and nausea.
